# Patient Record
Sex: Female | Born: 1935 | Race: White | Hispanic: No | Smoking: Former smoker
Health system: Southern US, Community
[De-identification: ages and names within clinical notes are randomized; demographics above are authoritative.]

## PROBLEM LIST (undated history)

## (undated) DIAGNOSIS — G7000 Myasthenia gravis without (acute) exacerbation: Secondary | ICD-10-CM

## (undated) DIAGNOSIS — M1990 Unspecified osteoarthritis, unspecified site: Secondary | ICD-10-CM

## (undated) DIAGNOSIS — F0390 Unspecified dementia without behavioral disturbance: Secondary | ICD-10-CM

## (undated) DIAGNOSIS — K759 Inflammatory liver disease, unspecified: Secondary | ICD-10-CM

## (undated) DIAGNOSIS — R519 Headache, unspecified: Secondary | ICD-10-CM

## (undated) DIAGNOSIS — R251 Tremor, unspecified: Secondary | ICD-10-CM

## (undated) DIAGNOSIS — E785 Hyperlipidemia, unspecified: Secondary | ICD-10-CM

## (undated) DIAGNOSIS — R51 Headache: Secondary | ICD-10-CM

## (undated) HISTORY — PX: COLONOSCOPY: SHX174

---
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
## 2004-06-28 ENCOUNTER — Ambulatory Visit: Payer: Self-pay | Admitting: Internal Medicine

## 2005-07-04 ENCOUNTER — Ambulatory Visit: Payer: Self-pay | Admitting: Internal Medicine

## 2006-05-01 ENCOUNTER — Emergency Department: Payer: Self-pay | Admitting: Emergency Medicine

## 2006-07-26 ENCOUNTER — Ambulatory Visit: Payer: Self-pay | Admitting: Internal Medicine

## 2007-05-29 ENCOUNTER — Ambulatory Visit: Payer: Self-pay | Admitting: Internal Medicine

## 2007-06-13 ENCOUNTER — Ambulatory Visit: Payer: Self-pay | Admitting: Obstetrics and Gynecology

## 2007-07-29 ENCOUNTER — Ambulatory Visit: Payer: Self-pay | Admitting: Internal Medicine

## 2007-09-17 ENCOUNTER — Ambulatory Visit: Payer: Self-pay | Admitting: Obstetrics and Gynecology

## 2008-03-25 ENCOUNTER — Ambulatory Visit: Payer: Self-pay | Admitting: Obstetrics and Gynecology

## 2008-04-28 ENCOUNTER — Ambulatory Visit: Payer: Self-pay | Admitting: Unknown Physician Specialty

## 2008-08-03 ENCOUNTER — Ambulatory Visit: Payer: Self-pay | Admitting: Internal Medicine

## 2009-08-04 ENCOUNTER — Ambulatory Visit: Payer: Self-pay | Admitting: Internal Medicine

## 2010-08-08 ENCOUNTER — Ambulatory Visit: Payer: Self-pay | Admitting: Internal Medicine

## 2010-10-11 ENCOUNTER — Ambulatory Visit: Payer: Self-pay | Admitting: Ophthalmology

## 2010-10-24 ENCOUNTER — Ambulatory Visit: Payer: Self-pay | Admitting: Ophthalmology

## 2011-08-23 ENCOUNTER — Ambulatory Visit: Payer: Self-pay | Admitting: Internal Medicine

## 2012-01-10 ENCOUNTER — Ambulatory Visit: Payer: Self-pay | Admitting: Ophthalmology

## 2012-01-22 ENCOUNTER — Ambulatory Visit: Payer: Self-pay | Admitting: Ophthalmology

## 2012-12-10 ENCOUNTER — Ambulatory Visit: Payer: Self-pay | Admitting: Internal Medicine

## 2012-12-11 ENCOUNTER — Ambulatory Visit: Payer: Self-pay | Admitting: Internal Medicine

## 2013-06-09 ENCOUNTER — Ambulatory Visit: Payer: Self-pay | Admitting: Ophthalmology

## 2013-06-09 LAB — SEDIMENTATION RATE: Erythrocyte Sed Rate: 9 mm/hr (ref 0–30)

## 2013-06-17 ENCOUNTER — Ambulatory Visit: Payer: Self-pay | Admitting: Internal Medicine

## 2013-12-04 DIAGNOSIS — G7000 Myasthenia gravis without (acute) exacerbation: Secondary | ICD-10-CM | POA: Insufficient documentation

## 2013-12-11 ENCOUNTER — Ambulatory Visit: Payer: Self-pay | Admitting: Internal Medicine

## 2014-07-21 DIAGNOSIS — E538 Deficiency of other specified B group vitamins: Secondary | ICD-10-CM | POA: Insufficient documentation

## 2014-09-01 NOTE — Op Note (Signed)
PATIENT NAME:  Lauren Roman, Lauren Roman MR#:  [id number] DATE OF BIRTH:  [birth year]  DATE OF PROCEDURE:  01/22/2012  PREOPERATIVE DIAGNOSIS:  Cataract, left eye.    POSTOPERATIVE DIAGNOSIS:  Cataract, left eye.  PROCEDURE PERFORMED:  Extracapsular cataract extraction using phacoemulsification with placement of an Alcon [id number], 23.5-diopter posterior chamber lens, serial # [id number].  SURGEON:  Maylon PeppersSteven A. Maurice Fotheringham, MD  ASSISTANT:  None.  ANESTHESIA:  4% lidocaine and 0.75% Marcaine in a 50/50 mixture with 10 units/mL of Hylenex added, given as a peribulbar.  ANESTHESIOLOGIST:  Dr. Dimple Caseyice.   COMPLICATIONS:  None.  ESTIMATED BLOOD LOSS:  Less than 1 mL.  DESCRIPTION OF PROCEDURE:  The patient was brought to the operating room and given a peribulbar block.  The patient was then prepped and draped in the usual fashion.  The vertical rectus muscles were imbricated using 5-0 silk sutures.  These sutures were then clamped to the sterile drapes as bridle sutures.  A limbal peritomy was performed extending two clock hours and hemostasis was obtained with cautery.  A partial thickness scleral groove was made at the surgical limbus and dissected anteriorly in a lamellar dissection using an Alcon crescent knife.  The anterior chamber was entered supero-temporally with a Superblade and through the lamellar dissection with a 2.6 mm keratome.  DisCoVisc was used to replace the aqueous and a continuous tear capsulorrhexis was carried out.  Hydrodissection and hydrodelineation were carried out with balanced salt and a 27 gauge canula.  The nucleus was rotated to confirm the effectiveness of the hydrodissection.  Phacoemulsification was carried out using a divide-and-conquer technique.  Total ultrasound time was 1 minute and 58.5 seconds with an average power of 20.4 percent. CDE 48.10.  Irrigation/aspiration was used to remove the residual cortex.  DisCoVisc was used to inflate the capsule and the internal  incision was enlarged to 3 mm with the crescent knife.  The intraocular lens was folded and inserted into the capsular bag using the AcrySert delivery system.  Irrigation/aspiration was used to remove the residual DisCoVisc.  Miostat was injected into the anterior chamber through the paracentesis track to inflate the anterior chamber and induce miosis.  The wound was checked for leaks and none were found. The conjunctiva was closed with cautery and the bridle sutures were removed.  Two drops of 0.3% Vigamox were placed on the eye.   An eye shield was placed on the eye.  The patient was discharged to the recovery room in good condition.  ____________________________ Maylon PeppersSteven A. Natasha Paulson, MD sad:cms D: 01/22/2012 13:41:09 ET T: 01/22/2012 14:03:51 ET JOB#: [id number]  cc: Viviann SpareSteven A. Kacy Hegna, MD, <Dictator>  Erline LevineSTEVEN A Danny Yackley MD ELECTRONICALLY SIGNED 02/12/2012 12:33

## 2015-12-19 ENCOUNTER — Emergency Department (HOSPITAL_COMMUNITY)
Admission: EM | Admit: 2015-12-19 | Discharge: 2015-12-19 | Discharge status: Absent without leave | Payer: Medicare Other

## 2016-06-15 DIAGNOSIS — E782 Mixed hyperlipidemia: Secondary | ICD-10-CM | POA: Insufficient documentation

## 2016-06-15 DIAGNOSIS — M1990 Unspecified osteoarthritis, unspecified site: Secondary | ICD-10-CM | POA: Insufficient documentation

## 2016-06-15 DIAGNOSIS — M818 Other osteoporosis without current pathological fracture: Secondary | ICD-10-CM | POA: Insufficient documentation

## 2017-12-31 ENCOUNTER — Other Ambulatory Visit: Payer: Self-pay | Admitting: Surgery

## 2017-12-31 ENCOUNTER — Ambulatory Visit
Admission: RE | Admit: 2017-12-31 | Discharge: 2017-12-31 | Disposition: A | Discharge status: Home / Self Care | Payer: Medicare Other | Source: Ambulatory Visit | Attending: Surgery | Admitting: Surgery

## 2017-12-31 DIAGNOSIS — M7062 Trochanteric bursitis, left hip: Secondary | ICD-10-CM | POA: Diagnosis present

## 2017-12-31 DIAGNOSIS — M25562 Pain in left knee: Secondary | ICD-10-CM | POA: Diagnosis present

## 2018-01-07 ENCOUNTER — Other Ambulatory Visit: Payer: Self-pay | Admitting: Student

## 2018-01-07 DIAGNOSIS — M25552 Pain in left hip: Secondary | ICD-10-CM

## 2018-01-07 DIAGNOSIS — M7062 Trochanteric bursitis, left hip: Secondary | ICD-10-CM

## 2018-01-08 ENCOUNTER — Other Ambulatory Visit: Payer: Self-pay | Admitting: Student

## 2018-01-08 DIAGNOSIS — M4807 Spinal stenosis, lumbosacral region: Secondary | ICD-10-CM

## 2018-01-11 ENCOUNTER — Ambulatory Visit
Admission: RE | Admit: 2018-01-11 | Discharge: 2018-01-11 | Disposition: A | Discharge status: Home / Self Care | Payer: Medicare Other | Source: Ambulatory Visit | Attending: Student | Admitting: Student

## 2018-01-11 ENCOUNTER — Ambulatory Visit: Payer: Medicare Other

## 2018-01-11 DIAGNOSIS — M4807 Spinal stenosis, lumbosacral region: Secondary | ICD-10-CM

## 2018-01-11 DIAGNOSIS — M5136 Other intervertebral disc degeneration, lumbar region: Secondary | ICD-10-CM | POA: Insufficient documentation

## 2018-01-11 DIAGNOSIS — M5126 Other intervertebral disc displacement, lumbar region: Secondary | ICD-10-CM | POA: Diagnosis not present

## 2018-01-11 DIAGNOSIS — M7062 Trochanteric bursitis, left hip: Secondary | ICD-10-CM | POA: Diagnosis present

## 2018-01-11 DIAGNOSIS — S73192A Other sprain of left hip, initial encounter: Secondary | ICD-10-CM | POA: Diagnosis not present

## 2018-01-11 DIAGNOSIS — M4316 Spondylolisthesis, lumbar region: Secondary | ICD-10-CM | POA: Diagnosis not present

## 2018-01-11 DIAGNOSIS — M25552 Pain in left hip: Secondary | ICD-10-CM

## 2018-01-20 ENCOUNTER — Ambulatory Visit: Payer: Medicare Other

## 2018-01-28 ENCOUNTER — Other Ambulatory Visit: Payer: Medicare Other

## 2018-01-29 ENCOUNTER — Other Ambulatory Visit: Payer: Self-pay

## 2018-01-29 ENCOUNTER — Encounter: Payer: Self-pay | Admitting: *Deleted

## 2018-01-29 ENCOUNTER — Encounter
Admission: RE | Admit: 2018-01-29 | Discharge: 2018-01-29 | Disposition: A | Discharge status: Home / Self Care | Payer: Medicare Other | Source: Ambulatory Visit | Attending: Neurosurgery | Admitting: Neurosurgery

## 2018-01-29 DIAGNOSIS — M6281 Muscle weakness (generalized): Secondary | ICD-10-CM | POA: Diagnosis not present

## 2018-01-29 DIAGNOSIS — M5116 Intervertebral disc disorders with radiculopathy, lumbar region: Secondary | ICD-10-CM | POA: Diagnosis present

## 2018-01-29 DIAGNOSIS — G7000 Myasthenia gravis without (acute) exacerbation: Secondary | ICD-10-CM | POA: Diagnosis not present

## 2018-01-29 DIAGNOSIS — Z87891 Personal history of nicotine dependence: Secondary | ICD-10-CM | POA: Diagnosis not present

## 2018-01-29 DIAGNOSIS — Z79899 Other long term (current) drug therapy: Secondary | ICD-10-CM | POA: Diagnosis not present

## 2018-01-29 HISTORY — DX: Unspecified dementia, unspecified severity, without behavioral disturbance, psychotic disturbance, mood disturbance, and anxiety: F03.90

## 2018-01-29 HISTORY — DX: Unspecified osteoarthritis, unspecified site: M19.90

## 2018-01-29 HISTORY — DX: Hyperlipidemia, unspecified: E78.5

## 2018-01-29 HISTORY — DX: Myasthenia gravis without (acute) exacerbation: G70.00

## 2018-01-29 HISTORY — DX: Headache, unspecified: R51.9

## 2018-01-29 HISTORY — DX: Headache: R51

## 2018-01-29 HISTORY — DX: Tremor, unspecified: R25.1

## 2018-01-29 HISTORY — DX: Inflammatory liver disease, unspecified: K75.9

## 2018-01-29 LAB — PROTIME-INR
INR: 1
Prothrombin Time: 13.1 seconds (ref 11.4–15.2)

## 2018-01-29 LAB — APTT: aPTT: 31 seconds (ref 24–36)

## 2018-01-29 LAB — TYPE AND SCREEN
ABO/RH(D): O POS
Antibody Screen: NEGATIVE

## 2018-01-29 LAB — SURGICAL PCR SCREEN
MRSA, PCR: NEGATIVE
Staphylococcus aureus: NEGATIVE

## 2018-01-29 NOTE — Patient Instructions (Addendum)
[facility]: [facility] 2nd floor medical mall [facility](Medical Mall Entrance-take elevator on left to 2nd floor.  Check in with surgery information desk.) To find out your arrival time please call [phone] between 1PM - 3PM on 01-29-18 TUESDAY  Remember: Instructions that are not followed completely may result in serious medical risk, up to and including death, or upon the discretion of your surgeon and anesthesiologist your surgery may need to be rescheduled.    _x___ 1. Do not eat food after midnight the night before your procedure. NO GUM OR CANDY AFTER MIDNIGHT.  You may drink clear liquids up to 2 hours before you are scheduled to arrive at the hospital for your procedure.  Do not drink clear liquids within 2 hours of your scheduled arrival to the hospital.  Clear liquids include  --Water or Apple juice without pulp  --Clear carbohydrate beverage such as ClearFast or Gatorade  --Black Coffee or Clear Tea (No milk, no creamers, do not add anything to the coffee or Tea   ____Ensure clear carbohydrate drink on the way to the hospital for bariatric patients  ____Ensure clear carbohydrate drink 3 hours before surgery for Dr Rutherford NailByrnett's patients if physician instructed.     __x__ 2. No Alcohol for 24 hours before or after surgery.   __x__3. No Smoking or e-cigarettes for 24 prior to surgery.  Do not use any chewable tobacco products for at least 6 hour prior to surgery   ____  4. Bring all medications with you on the day of surgery if instructed.    __x__ 5. Notify your doctor if there is any change in your medical condition     (cold, fever, infections).    x___6. On the morning of surgery brush your teeth with toothpaste and water.  You may rinse your mouth with mouth wash if you wish.  Do not swallow any toothpaste or mouthwash.   Do not wear jewelry, make-up, hairpins, clips or nail polish.  Do not wear lotions, powders, or perfumes.  You may wear deodorant.  Do not shave 48 hours prior to surgery. Men may shave face and neck.  Do not bring valuables to the hospital.    [facility] for any belongings or valuables.               Contacts, dentures or bridgework may not be worn into surgery.  Leave your suitcase in the car. After surgery it may be brought to your room.  For patients admitted to the hospital, discharge time is determined by your treatment team.  _  Patients discharged the day of surgery will not be allowed to drive home.  You will need someone to drive you home and stay with you the night of your procedure.    Please read over the following fact sheets that you were given:   [facility] Preparing [facility] and or MRSA Information   _x___ TAKE THE FOLLOWING MEDICATION THE MORNING OF SURGERY WITH A SMALL SIP OF WATER. These include:  1. GABAPENTIN (NEURONTIN)  2. YOU MAY TAKE HYDROCODONE DAY OF SURGERY IF NEEDED  3.  4.  5.  6.  ____Fleets enema or Magnesium Citrate as directed.   _x___ Use CHG Soap or sage wipes as directed on instruction sheet   ____ Use inhalers on the day of surgery and bring to hospital day of surgery  ____ Stop Metformin and Janumet 2 days prior to  surgery.    ____ Take 1/2 of usual insulin dose the night before surgery and none on the morning surgery.   ____ Follow recommendations from Cardiologist, Pulmonologist or PCP regarding stopping Aspirin, Coumadin, Plavix ,Eliquis, Effient, or Pradaxa, and Pletal.  X____Stop Anti-inflammatories such as Advil, Aleve, Ibuprofen, Motrin, Naproxen, Naprosyn, Goodies powders or aspirin products NOW- OK to take Tylenol OR HYDROCODONE IF NEEDED   ____ Stop supplements until after surgery.     ____ Bring C-Pap to the hospital.

## 2018-01-30 ENCOUNTER — Ambulatory Visit: Payer: Medicare Other | Admitting: Certified Registered"

## 2018-01-30 ENCOUNTER — Ambulatory Visit: Payer: Medicare Other

## 2018-01-30 ENCOUNTER — Encounter: Payer: Self-pay | Admitting: *Deleted

## 2018-01-30 ENCOUNTER — Other Ambulatory Visit: Payer: Self-pay

## 2018-01-30 ENCOUNTER — Encounter
Admission: RE | Disposition: A | Discharge status: Home / Self Care | Payer: Self-pay | Source: Ambulatory Visit | Attending: Neurosurgery

## 2018-01-30 ENCOUNTER — Observation Stay
Admission: RE | Admit: 2018-01-30 | Discharge: 2018-01-31 | Disposition: A | Discharge status: Home / Self Care | Payer: Medicare Other | Source: Ambulatory Visit | Attending: Neurosurgery | Admitting: Neurosurgery

## 2018-01-30 DIAGNOSIS — G7000 Myasthenia gravis without (acute) exacerbation: Secondary | ICD-10-CM | POA: Insufficient documentation

## 2018-01-30 DIAGNOSIS — M5116 Intervertebral disc disorders with radiculopathy, lumbar region: Secondary | ICD-10-CM | POA: Diagnosis not present

## 2018-01-30 DIAGNOSIS — Z79899 Other long term (current) drug therapy: Secondary | ICD-10-CM | POA: Insufficient documentation

## 2018-01-30 DIAGNOSIS — M6281 Muscle weakness (generalized): Secondary | ICD-10-CM | POA: Insufficient documentation

## 2018-01-30 DIAGNOSIS — M5416 Radiculopathy, lumbar region: Secondary | ICD-10-CM | POA: Diagnosis present

## 2018-01-30 DIAGNOSIS — Z87891 Personal history of nicotine dependence: Secondary | ICD-10-CM | POA: Insufficient documentation

## 2018-01-30 DIAGNOSIS — Z419 Encounter for procedure for purposes other than remedying health state, unspecified: Secondary | ICD-10-CM

## 2018-01-30 HISTORY — PX: LUMBAR LAMINECTOMY/DECOMPRESSION MICRODISCECTOMY: SHX5026

## 2018-01-30 LAB — ABO/RH: ABO/RH(D): O POS

## 2018-01-30 SURGERY — LUMBAR LAMINECTOMY/DECOMPRESSION MICRODISCECTOMY 1 LEVEL
Anesthesia: General | Site: Back

## 2018-01-30 MED ORDER — METHYLPREDNISOLONE ACETATE 40 MG/ML IJ SUSP
INTRAMUSCULAR | Status: DC | PRN
  Administered 2018-01-30: 40 mg

## 2018-01-30 MED ORDER — DEXAMETHASONE SODIUM PHOSPHATE 10 MG/ML IJ SOLN
INTRAMUSCULAR | Status: AC
  Filled 2018-01-30: qty 1

## 2018-01-30 MED ORDER — DEXAMETHASONE SODIUM PHOSPHATE 4 MG/ML IJ SOLN
4.0000 mg | Freq: Three times a day (TID) | INTRAMUSCULAR | Status: DC
  Administered 2018-01-30 – 2018-01-31 (×3): 4 mg via INTRAVENOUS
  Filled 2018-01-30 (×4): qty 1

## 2018-01-30 MED ORDER — SODIUM CHLORIDE 0.9% FLUSH
3.0000 mL | Freq: Two times a day (BID) | INTRAVENOUS | Status: DC
  Administered 2018-01-30 – 2018-01-31 (×2): 3 mL via INTRAVENOUS

## 2018-01-30 MED ORDER — PROPOFOL 10 MG/ML IV BOLUS
INTRAVENOUS | Status: DC | PRN
  Administered 2018-01-30: 50 mg via INTRAVENOUS
  Administered 2018-01-30 (×3): 30 mg via INTRAVENOUS

## 2018-01-30 MED ORDER — FENTANYL CITRATE (PF) 100 MCG/2ML IJ SOLN
INTRAMUSCULAR | Status: AC
  Filled 2018-01-30: qty 2

## 2018-01-30 MED ORDER — SUCCINYLCHOLINE CHLORIDE 20 MG/ML IJ SOLN
INTRAMUSCULAR | Status: AC
  Filled 2018-01-30: qty 1

## 2018-01-30 MED ORDER — ACETAMINOPHEN 500 MG PO TABS
1000.0000 mg | ORAL_TABLET | Freq: Four times a day (QID) | ORAL | Status: AC
  Administered 2018-01-30 – 2018-01-31 (×3): 1000 mg via ORAL
  Filled 2018-01-30 (×4): qty 2

## 2018-01-30 MED ORDER — SODIUM CHLORIDE 0.9 % IV SOLN
250.0000 mL | INTRAVENOUS | Status: DC
  Administered 2018-01-30: 250 mL via INTRAVENOUS

## 2018-01-30 MED ORDER — TRAMADOL HCL 50 MG PO TABS
50.0000 mg | ORAL_TABLET | ORAL | Status: DC | PRN
  Administered 2018-01-31: 50 mg via ORAL
  Filled 2018-01-30: qty 1

## 2018-01-30 MED ORDER — SODIUM CHLORIDE 0.9 % IR SOLN
Status: DC | PRN
  Administered 2018-01-30: 1000 mL

## 2018-01-30 MED ORDER — PROMETHAZINE HCL 25 MG/ML IJ SOLN: 6.2500 mg | INTRAMUSCULAR | Status: DC | PRN

## 2018-01-30 MED ORDER — ONDANSETRON HCL 4 MG/2ML IJ SOLN: 4.0000 mg | Freq: Four times a day (QID) | INTRAMUSCULAR | Status: DC | PRN

## 2018-01-30 MED ORDER — SENNOSIDES-DOCUSATE SODIUM 8.6-50 MG PO TABS: 1.0000 | ORAL_TABLET | Freq: Every evening | ORAL | Status: DC | PRN

## 2018-01-30 MED ORDER — SODIUM CHLORIDE 0.9% FLUSH: 3.0000 mL | INTRAVENOUS | Status: DC | PRN

## 2018-01-30 MED ORDER — ACETAMINOPHEN 325 MG PO TABS: 650.0000 mg | ORAL_TABLET | ORAL | Status: DC | PRN

## 2018-01-30 MED ORDER — FAMOTIDINE 20 MG PO TABS
20.0000 mg | ORAL_TABLET | Freq: Once | ORAL | Status: AC
  Administered 2018-01-30: 20 mg via ORAL

## 2018-01-30 MED ORDER — PROPOFOL 10 MG/ML IV BOLUS
INTRAVENOUS | Status: AC
  Filled 2018-01-30: qty 20

## 2018-01-30 MED ORDER — LACTATED RINGERS IV SOLN
INTRAVENOUS | Status: DC
  Administered 2018-01-30 (×2): via INTRAVENOUS

## 2018-01-30 MED ORDER — PHENOL 1.4 % MT LIQD
1.0000 | OROMUCOSAL | Status: DC | PRN
  Filled 2018-01-30: qty 177

## 2018-01-30 MED ORDER — ACETAMINOPHEN 650 MG RE SUPP: 650.0000 mg | RECTAL | Status: DC | PRN

## 2018-01-30 MED ORDER — MENTHOL 3 MG MT LOZG
1.0000 | LOZENGE | OROMUCOSAL | Status: DC | PRN
  Filled 2018-01-30: qty 9

## 2018-01-30 MED ORDER — SUCCINYLCHOLINE CHLORIDE 20 MG/ML IJ SOLN
INTRAMUSCULAR | Status: DC | PRN
  Administered 2018-01-30: 80 mg via INTRAVENOUS

## 2018-01-30 MED ORDER — CEFAZOLIN SODIUM-DEXTROSE 1-4 GM/50ML-% IV SOLN
1.0000 g | Freq: Once | INTRAVENOUS | Status: AC
  Administered 2018-01-30: 1 g via INTRAVENOUS

## 2018-01-30 MED ORDER — DEXAMETHASONE SODIUM PHOSPHATE 10 MG/ML IJ SOLN
INTRAMUSCULAR | Status: DC | PRN
  Administered 2018-01-30: 5 mg via INTRAVENOUS

## 2018-01-30 MED ORDER — SEVOFLURANE IN SOLN
RESPIRATORY_TRACT | Status: AC
  Filled 2018-01-30: qty 250

## 2018-01-30 MED ORDER — ROCURONIUM BROMIDE 50 MG/5ML IV SOLN
INTRAVENOUS | Status: AC
  Filled 2018-01-30: qty 1

## 2018-01-30 MED ORDER — GABAPENTIN 300 MG PO CAPS
300.0000 mg | ORAL_CAPSULE | Freq: Three times a day (TID) | ORAL | Status: DC
  Administered 2018-01-30 – 2018-01-31 (×3): 300 mg via ORAL
  Filled 2018-01-30 (×3): qty 1

## 2018-01-30 MED ORDER — METHOCARBAMOL 1000 MG/10ML IJ SOLN
500.0000 mg | Freq: Four times a day (QID) | INTRAVENOUS | Status: DC | PRN
  Filled 2018-01-30: qty 5

## 2018-01-30 MED ORDER — ROCURONIUM BROMIDE 100 MG/10ML IV SOLN
INTRAVENOUS | Status: DC | PRN
  Administered 2018-01-30: 5 mg via INTRAVENOUS

## 2018-01-30 MED ORDER — SODIUM CHLORIDE 0.9 % IV SOLN
INTRAVENOUS | Status: DC
  Administered 2018-01-30: 21:00:00 via INTRAVENOUS

## 2018-01-30 MED ORDER — ONDANSETRON HCL 4 MG PO TABS: 4.0000 mg | ORAL_TABLET | Freq: Four times a day (QID) | ORAL | Status: DC | PRN

## 2018-01-30 MED ORDER — METHOCARBAMOL 500 MG PO TABS: 500.0000 mg | ORAL_TABLET | Freq: Four times a day (QID) | ORAL | Status: DC | PRN

## 2018-01-30 MED ORDER — LIDOCAINE HCL (PF) 2 % IJ SOLN
INTRAMUSCULAR | Status: AC
  Filled 2018-01-30: qty 10

## 2018-01-30 MED ORDER — SODIUM CHLORIDE 0.9 % IV SOLN
INTRAVENOUS | Status: DC | PRN
  Administered 2018-01-30: 40 mL

## 2018-01-30 MED ORDER — FENTANYL CITRATE (PF) 100 MCG/2ML IJ SOLN
INTRAMUSCULAR | Status: DC | PRN
  Administered 2018-01-30 (×2): 50 ug via INTRAVENOUS

## 2018-01-30 MED ORDER — ONDANSETRON HCL 4 MG/2ML IJ SOLN
INTRAMUSCULAR | Status: AC
  Filled 2018-01-30: qty 2

## 2018-01-30 MED ORDER — BUPIVACAINE-EPINEPHRINE (PF) 0.5% -1:200000 IJ SOLN
INTRAMUSCULAR | Status: DC | PRN
  Administered 2018-01-30: 8 mL

## 2018-01-30 MED ORDER — LIDOCAINE HCL (CARDIAC) PF 100 MG/5ML IV SOSY
PREFILLED_SYRINGE | INTRAVENOUS | Status: DC | PRN
  Administered 2018-01-30: 60 mg via INTRAVENOUS

## 2018-01-30 MED ORDER — FENTANYL CITRATE (PF) 100 MCG/2ML IJ SOLN
25.0000 ug | INTRAMUSCULAR | Status: DC | PRN
  Administered 2018-01-30 (×2): 25 ug via INTRAVENOUS

## 2018-01-30 MED ORDER — PROPOFOL 500 MG/50ML IV EMUL
INTRAVENOUS | Status: DC | PRN
  Administered 2018-01-30: 25 ug/kg/min via INTRAVENOUS

## 2018-01-30 MED ORDER — CEFAZOLIN SODIUM-DEXTROSE 1-4 GM/50ML-% IV SOLN
INTRAVENOUS | Status: AC
  Filled 2018-01-30: qty 50

## 2018-01-30 MED ORDER — ACETAMINOPHEN 10 MG/ML IV SOLN
INTRAVENOUS | Status: DC | PRN
  Administered 2018-01-30: 1000 mg via INTRAVENOUS

## 2018-01-30 MED ORDER — KETOROLAC TROMETHAMINE 30 MG/ML IJ SOLN
INTRAMUSCULAR | Status: DC | PRN
  Administered 2018-01-30: 15 mg via INTRAVENOUS

## 2018-01-30 MED ORDER — ONDANSETRON HCL 4 MG/2ML IJ SOLN
INTRAMUSCULAR | Status: DC | PRN
  Administered 2018-01-30: 4 mg via INTRAVENOUS

## 2018-01-30 MED ORDER — FAMOTIDINE 20 MG PO TABS
ORAL_TABLET | ORAL | Status: AC
  Administered 2018-01-30: 20 mg via ORAL
  Filled 2018-01-30: qty 1

## 2018-01-30 MED ORDER — BUPIVACAINE HCL 0.5 % IJ SOLN
INTRAMUSCULAR | Status: DC | PRN
  Administered 2018-01-30: 20 mL

## 2018-01-30 MED ORDER — FENTANYL CITRATE (PF) 100 MCG/2ML IJ SOLN
INTRAMUSCULAR | Status: AC
  Administered 2018-01-30: 25 ug via INTRAVENOUS
  Filled 2018-01-30: qty 2

## 2018-01-30 MED ORDER — ACETAMINOPHEN 10 MG/ML IV SOLN
INTRAVENOUS | Status: AC
  Filled 2018-01-30: qty 100

## 2018-01-30 MED ORDER — KETOROLAC TROMETHAMINE 30 MG/ML IJ SOLN
INTRAMUSCULAR | Status: AC
  Filled 2018-01-30: qty 1

## 2018-01-30 MED ORDER — THROMBIN 5000 UNITS EX SOLR
CUTANEOUS | Status: DC | PRN
  Administered 2018-01-30: 5000 [IU] via TOPICAL

## 2018-01-30 SURGICAL SUPPLY — 56 items
BUR NEURO DRILL SOFT 3.0X3.8M (BURR) ×2 IMPLANT
CANISTER SUCT 1200ML W/VALVE (MISCELLANEOUS) ×4 IMPLANT
CHLORAPREP W/TINT 26ML (MISCELLANEOUS) ×4 IMPLANT
CNTNR SPEC 2.5X3XGRAD LEK (MISCELLANEOUS) ×1
CONT SPEC 4OZ STER OR WHT (MISCELLANEOUS) ×1
CONTAINER SPEC 2.5X3XGRAD LEK (MISCELLANEOUS) ×1 IMPLANT
COUNTER NEEDLE 20/40 LG (NEEDLE) ×2 IMPLANT
COVER LIGHT HANDLE STERIS (MISCELLANEOUS) ×4 IMPLANT
CUP MEDICINE 2OZ PLAST GRAD ST (MISCELLANEOUS) ×4 IMPLANT
DERMABOND ADVANCED (GAUZE/BANDAGES/DRESSINGS) ×1
DERMABOND ADVANCED .7 DNX12 (GAUZE/BANDAGES/DRESSINGS) ×1 IMPLANT
DRAPE C-ARM 42X72 X-RAY (DRAPES) ×4 IMPLANT
DRAPE LAPAROTOMY 100X77 ABD (DRAPES) ×2 IMPLANT
DRAPE MICROSCOPE SPINE 48X150 (DRAPES) ×2 IMPLANT
DRAPE POUCH INSTRU U-SHP 10X18 (DRAPES) ×2 IMPLANT
DRAPE SURG 17X11 SM STRL (DRAPES) ×8 IMPLANT
ELECT CAUTERY BLADE TIP 2.5 (TIP) ×2
ELECT EZSTD 165MM 6.5IN (MISCELLANEOUS)
ELECT REM PT RETURN 9FT ADLT (ELECTROSURGICAL) ×2
ELECTRODE CAUTERY BLDE TIP 2.5 (TIP) ×1 IMPLANT
ELECTRODE EZSTD 165MM 6.5IN (MISCELLANEOUS) IMPLANT
ELECTRODE REM PT RTRN 9FT ADLT (ELECTROSURGICAL) ×1 IMPLANT
FRAME EYE SHIELD (PROTECTIVE WEAR) ×4 IMPLANT
GLOVE BIO SURGEON STRL SZ 6.5 (GLOVE) ×4 IMPLANT
GLOVE BIOGEL PI IND STRL 7.0 (GLOVE) ×1 IMPLANT
GLOVE BIOGEL PI INDICATOR 7.0 (GLOVE) ×1
GLOVE SURG SYN 8.5  E (GLOVE) ×3
GLOVE SURG SYN 8.5 E (GLOVE) ×3 IMPLANT
GOWN SRG XL LVL 3 NONREINFORCE (GOWNS) ×1 IMPLANT
GOWN STRL NON-REIN TWL XL LVL3 (GOWNS) ×1
GOWN STRL REUS W/TWL MED LVL3 (GOWN DISPOSABLE) ×2 IMPLANT
GRADUATE 1200CC STRL 31836 (MISCELLANEOUS) ×2 IMPLANT
IV CATH ANGIO 12GX3 LT BLUE (NEEDLE) ×2 IMPLANT
KIT SPINAL PRONEVIEW (KITS) ×2 IMPLANT
KNIFE BAYONET SHORT DISCETOMY (MISCELLANEOUS) IMPLANT
MARKER SKIN DUAL TIP RULER LAB (MISCELLANEOUS) ×2 IMPLANT
NDL SAFETY ECLIPSE 18X1.5 (NEEDLE) ×1 IMPLANT
NEEDLE HYPO 18GX1.5 SHARP (NEEDLE) ×1
NEEDLE HYPO 22GX1.5 SAFETY (NEEDLE) ×2 IMPLANT
NS IRRIG 1000ML POUR BTL (IV SOLUTION) ×2 IMPLANT
PACK LAMINECTOMY NEURO (CUSTOM PROCEDURE TRAY) ×2 IMPLANT
PAD ARMBOARD 7.5X6 YLW CONV (MISCELLANEOUS) ×2 IMPLANT
SPOGE SURGIFLO 8M (HEMOSTASIS) ×1
SPONGE SURGIFLO 8M (HEMOSTASIS) ×1 IMPLANT
SUT DVC VLOC 3-0 CL 6 P-12 (SUTURE) ×2 IMPLANT
SUT VIC AB 0 CT1 27 (SUTURE) ×1
SUT VIC AB 0 CT1 27XCR 8 STRN (SUTURE) ×1 IMPLANT
SUT VIC AB 2-0 CT1 18 (SUTURE) ×2 IMPLANT
SYR 10ML LL (SYRINGE) ×2 IMPLANT
SYR 20CC LL (SYRINGE) ×2 IMPLANT
SYR 30ML LL (SYRINGE) ×4 IMPLANT
SYR 3ML LL SCALE MARK (SYRINGE) ×2 IMPLANT
TOWEL OR 17X26 4PK STRL BLUE (TOWEL DISPOSABLE) ×6 IMPLANT
TUBE MATRX SPINL 18MM 4CM DISP (INSTRUMENTS) ×1
TUBE METRX SPINAL 18X4 DISP (INSTRUMENTS) ×1 IMPLANT
TUBING CONNECTING 10 (TUBING) ×2 IMPLANT

## 2018-01-30 NOTE — Anesthesia Procedure Notes (Addendum)
Procedure Name: Intubation Date/Time: 01/30/2018 3:40 PM Performed by: Dionne Bucy, CRNA Pre-anesthesia Checklist: Patient identified, Patient being monitored, Timeout performed, Emergency Drugs available and Suction available Patient Re-evaluated:Patient Re-evaluated prior to induction Oxygen Delivery Method: Circle system utilized Preoxygenation: Pre-oxygenation with 100% oxygen Induction Type: IV induction Ventilation: Mask ventilation without difficulty Laryngoscope Size: Mac and 3 Grade View: Grade I Tube type: Oral Tube size: 6.5 mm Number of attempts: 1 Airway Equipment and Method: Stylet Placement Confirmation: ETT inserted through vocal cords under direct vision,  positive ETCO2 and breath sounds checked- equal and bilateral Secured at: 21 cm Tube secured with: Tape Dental Injury: Teeth and Oropharynx as per pre-operative assessment

## 2018-01-30 NOTE — Anesthesia Preprocedure Evaluation (Addendum)
Anesthesia Evaluation  Patient identified by MRN, date of birth, ID band Patient awake    Reviewed: Allergy & Precautions, H&P , NPO status , Patient's Chart, lab work & pertinent test results  Airway Mallampati: II  TM Distance: >3 FB Neck ROM: limited    Dental  (+) Missing, Chipped   Pulmonary neg pulmonary ROS, former smoker,    breath sounds clear to auscultation       Cardiovascular negative cardio ROS   Rhythm:regular Rate:Normal     Neuro/Psych  Headaches, PSYCHIATRIC DISORDERS Dementia  Neuromuscular disease (MYASTHENIA GRAVIS - pt and her daughter deny this diagnosis, she does not take any meds but has a documented allergy to Exelon... unclear diagnosis) negative neurological ROS  negative psych ROS   GI/Hepatic negative GI ROS, Neg liver ROS, (+) Hepatitis -, B  Endo/Other  negative endocrine ROS  Renal/GU      Musculoskeletal   Abdominal   Peds  Hematology negative hematology ROS (+)   Anesthesia Other Findings Past Medical History: No date: Arthritis No date: Dementia     Comment:  mild No date: Headache     Comment:  migraines 1950: Hepatitis     Comment:  hep b-no problems since then No date: Hyperlipidemia No date: Myasthenia gravis (HCC) No date: Tremor     Comment:  right hand  Past Surgical History: No date: COLONOSCOPY  BMI    Body Mass Index:  20.04 kg/m      Reproductive/Obstetrics negative OB ROS                            Anesthesia Physical Anesthesia Plan  ASA: III  Anesthesia Plan: General ETT   Post-op Pain Management:    Induction: Intravenous  PONV Risk Score and Plan: Ondansetron and Dexamethasone  Airway Management Planned: Oral ETT  Additional Equipment:   Intra-op Plan:   Post-operative Plan: Extubation in OR  Informed Consent: I have reviewed the patients History and Physical, chart, labs and discussed the procedure including  the risks, benefits and alternatives for the proposed anesthesia with the patient or authorized representative who has indicated his/her understanding and acceptance.   Dental Advisory Given  Plan Discussed with: Anesthesiologist, CRNA and Surgeon  Anesthesia Plan Comments: (Spoke to patient and daughter regarding possibility of prolonged postop intubation in setting of MG.)       Anesthesia Quick Evaluation

## 2018-01-30 NOTE — H&P (Signed)
Heart sounds normal no MRG. Chest Clear to Auscultation Bilaterally.  I have reviewed and confirmed my history and physical from 01/24/2018 with no additions or changes. Plan for L4-5 decompression and left L4-5 far lateral discectomy.  Risks and benefits reviewed.

## 2018-01-30 NOTE — Op Note (Signed)
Indications: Lauren Roman is an 84 yo female who presented with lumbar radiculopathy.  She failed conservative management and elected for surgical intervention.  Findings: disc herniation L4-5 impacting L L4 nerve root, central stenosis at L4-5  Preoperative Diagnosis: Lumbar radiculopathy Postoperative Diagnosis: same   EBL: 125 ml IVF: 1100 ml Drains: one placed Disposition: Extubated and Stable to PACU Complications: none  No foley catheter was placed.   Preoperative Note:   Risks of surgery discussed include: infection, bleeding, stroke, coma, death, paralysis, CSF leak, nerve/spinal cord injury, numbness, tingling, weakness, complex regional pain syndrome, recurrent stenosis and/or disc herniation, vascular injury, development of instability, neck/back pain, need for further surgery, persistent symptoms, development of deformity, and the risks of anesthesia. The patient understood these risks and agreed to proceed.  Operative Note:    The patient was then brought from the preoperative center with intravenous access established.  The patient underwent general anesthesia and endotracheal tube intubation, and was then rotated on the [place] rail top where all pressure points were appropriately padded.  The skin was then thoroughly cleansed.  Perioperative antibiotic prophylaxis was administered.  Sterile prep and drapes were then applied and a timeout was then observed.  C-arm was brought into the field under sterile conditions, and the L4-5 disc space identified and marked with an incision on the left 1cm lateral to midline. A second mark was placed over the left L5 pedicle 3 cm lateral to the midline.   Once this was complete a 2 cm incision was opened with the use of a #10 blade knife over the transverse process.  The Metrx tubes were sequentially advanced under lateral fluoroscopy until a 18 x 40 mm Metrx tube was placed over the left L5 transverse process and secured to the bed.     The microscope was then sterilely brought into the field and muscle creep was hemostased with a bipolar and resected with a pituitary rongeur.  A Bovie extender was then used to expose the left L5 transverse process and lateral L4-5 facet.  Careful attention was placed to not violate the facet capsule. A 3 mm matchstick drill bit was then used to remove the outer portion of the facet joint and top of the L5 transverse process.  The intertransverse membrane was identified and dissected free of the L5 transverse process.  Using careful dissection, the superior margin of the L5 pedicle was identified and dissected down to the junction with the vertebral body.  The disc was identified, and herniated material was noted.  The Penfield 4 and nerve dissector were used to identify the L4 nerve root.  The disc herniation was identified and dissected free using a balltip probe. The pituitary rongeur was used to remove the extruded disc fragments. Once the  nerve root were noted to be relaxed and under less tension the ball-tipped feeler was passed along the foramen proximally and distally to to ensure no residual compression was noted.    A Depo-Medrol soaked Gelfoam pledget was placed along the nerve root for 2 minutes and removed.  The area was irrigated. The tube system was then removed under microscopic visualization and hemostasis was obtained with a bipolar.    We then moved to the L4-5 central decompression. A 2 cm incision was opened 1 cm lateral to the midline with the use of a #10 blade knife.  The Metrx tubes were sequentially advanced under lateral fluoroscopy until a 18 x 40 mm Metrx tube was placed over the left L4/5  facet and lamina and secured to the bed.    The microscope was then used to visualize the lamina and spinous process.  A Bovie extender was then used to expose the spinous process and lamina.  Careful attention was placed to not violate the facet capsule. A 3 mm matchstick drill bit was  then used to make a hemi-laminotomy trough until the ligamentum flavum was exposed.  This was extended to the base of the spinous process.  Once this was complete and the underlying ligamentum flavum was visualized this was dissected with an up angle curette and resected with a #2 and #3 mm biting Kerrison.  The laminotomy opening was also expanded in similar fashion and hemostasis was obtained with Surgifoam and a patty as well as bone wax.  The rostral aspect of the caudal level of the lamina was also resected with a #2 biting Kerrison effort to further enhance exposure.  Once the underlying dura was visualized a Penfield 4 was then used to dissect and expose the traversing nerve root.    Using curettes and punches, the majority of the ligamentum flavum was removed. Once the thecal sac and nerve root were noted to be relaxed and under less tension the ball-tipped feeler was passed along the foramen distally to to ensure no residual compression was noted along the left L5 nerve root.    A Depo-Medrol soaked Gelfoam pledget was placed along the nerve root for 2 minutes and removed.  The area was irrigated. The tube system was then removed under microscopic visualization and hemostasis was obtained with a bipolar.    A drain was placed through both operative sites.   The fascial layer was reapproximated with the use of a 0- Vicryl suture.  Subcutaneous tissue layer was reapproximated using 2-0 Vicryl suture.  3-0 monocryl was used on the skin. The skin was then cleansed and Dermabond was used to close the skin opening.  Patient was then rotated back to the preoperative bed awakened from anesthesia and taken to recovery all counts are correct in this case.   I performed the entire procedure with the assistance of Ivar DrapeAmanda Ferri PA as an Designer, television/film setassistant surgeon.  Venetia Nighthester Silver Achey MD

## 2018-01-30 NOTE — Anesthesia Postprocedure Evaluation (Signed)
Anesthesia Post Note  Patient: Larene PickettDorothy G Motter  Procedure(s) Performed: LUMBAR LAMINECTOMY/DECOMPRESSION MICRODISCECTOMY 1 LEVEL-L4-5 FAR LATERAL DISCECTOMY (N/A Back)  Patient location during evaluation: PACU Anesthesia Type: General Level of consciousness: awake Pain management: pain level controlled Vital Signs Assessment: post-procedure vital signs reviewed and stable Respiratory status: spontaneous breathing, nonlabored ventilation, respiratory function stable and patient connected to nasal cannula oxygen Cardiovascular status: blood pressure returned to baseline and stable Postop Assessment: no apparent nausea or vomiting Anesthetic complications: no     Last Vitals:  Vitals:   01/30/18 1830 01/30/18 1845  BP: (!) 182/74 (!) 157/85  Pulse: 90 92  Resp: 14 15  Temp:    SpO2: 96% 97%    Last Pain:  Vitals:   01/30/18 1859  TempSrc:   PainSc: 7                  Cleda MccreedyJoseph K Jaelan Rasheed

## 2018-01-30 NOTE — Anesthesia Post-op Follow-up Note (Signed)
Anesthesia QCDR form completed.        

## 2018-01-30 NOTE — Transfer of Care (Signed)
Immediate Anesthesia Transfer of Care Note  Patient: Lauren PickettDorothy G Roman  Procedure(s) Performed: LUMBAR LAMINECTOMY/DECOMPRESSION MICRODISCECTOMY 1 LEVEL-L4-5 FAR LATERAL DISCECTOMY (N/A Back)  Patient Location: PACU  Anesthesia Type:General  Level of Consciousness: sedated  Airway & Oxygen Therapy: Patient Spontanous Breathing and Patient connected to face mask oxygen  Post-op Assessment: Report given to RN and Post -op Vital signs reviewed and stable  Post vital signs: Reviewed and stable  Last Vitals:  Vitals Value Taken Time  BP 182/74   Temp    Pulse 85 01/30/2018  6:01 PM  Resp 13 01/30/2018  6:01 PM  SpO2 100 % 01/30/2018  6:01 PM  Vitals shown include unvalidated device data.  Last Pain:  Vitals:   01/30/18 1355  TempSrc: Tympanic  PainSc: 4       Patients Stated Pain Goal: 0 (01/30/18 1355)  Complications: No apparent anesthesia complications

## 2018-01-30 NOTE — Progress Notes (Signed)
Procedure: L4-5 lumbar laminectomy and far lateral discectomy  Procedure Date: 01/30/2018 Diagnosis: Lumbar radiculopathy  History: Lauren PickettDorothy G Roman is here for L4-5 lumbar decompression with far lateral discectomy. Tolerated procedure well. Seen in post-op recovery still disoriented from anesthesia.  Complains of left leg pain but unable to specify.   Physical Exam: Vitals:   01/30/18 1355 01/30/18 1800  BP: (!) 179/71 (!) 182/74  Pulse: 87 86  Resp: 20 13  Temp: 97.8 F (36.6 C) 97.6 F (36.4 C)  SpO2: 100% 100%    AA Ox3 Skin: 2 incision sites: No bleeding, glue intact.  Drain site intact and dressed.  No bleeding. Strength:unable to assess. Patient able to move all extremities on command Sensation: Unable to fully assess due to sleepiness.   Data:  No results for input(s): NA, K, CL, CO2, BUN, CREATININE, LABGLOM, GLUCOSE, CALCIUM in the last 168 hours. No results for input(s): AST, ALT, ALKPHOS in the last 168 hours.  Invalid input(s): TBILI   No results for input(s): WBC, HGB, HCT, PLT in the last 168 hours. Recent Labs  Lab 01/29/18 1244  APTT 31  INR 1.00         Other tests/results: No imaging reviewed  Assessment/Plan:  Lauren Pickettorothy G Roman POD 0 status post L4-5 lumbar laminectomy and far lateral discectomy.  Will admit for continued observance and pain management.  Current plan is to manage pain through Tylenol, tramadol, dexamethasone, and Robaxin as needed.  - mobilize - pain control - DVT prophylaxis - PTOT  Ivar DrapeAmanda Jerardo Costabile PA-C Department of Neurosurgery

## 2018-01-31 ENCOUNTER — Encounter: Payer: Self-pay | Admitting: Neurosurgery

## 2018-01-31 DIAGNOSIS — M5116 Intervertebral disc disorders with radiculopathy, lumbar region: Secondary | ICD-10-CM | POA: Diagnosis not present

## 2018-01-31 LAB — GLUCOSE, CAPILLARY
Glucose-Capillary: 120 mg/dL — ABNORMAL HIGH (ref 70–99)
Glucose-Capillary: 164 mg/dL — ABNORMAL HIGH (ref 70–99)

## 2018-01-31 MED ORDER — TRAMADOL HCL 50 MG PO TABS: 50.0000 mg | ORAL_TABLET | ORAL | 0 refills | Status: DC | PRN

## 2018-01-31 MED ORDER — METHOCARBAMOL 500 MG PO TABS: 500.0000 mg | ORAL_TABLET | Freq: Four times a day (QID) | ORAL | 0 refills | Status: DC | PRN

## 2018-01-31 NOTE — Evaluation (Signed)
Physical Therapy Evaluation Patient Details Name: Lauren Roman MRN: [id number] DOB: 04/30/1929 Today's Date: 01/31/2018   History of Present Illness  Pt is an 84 yo female who presented with lumbar radiculopathy.  She failed conservative management and elected for surgical intervention.  Pt is now s/p L4-5 lumbar laminectomy and far lateral discectomy.  PMH includes: dementia, myasthenia gravis (pt/daughter deny this diagnosis), arthritis, right hand tremor, and Hep-B.      Clinical Impression  Pt presents with deficits in strength, transfers, mobility, gait, balance, and activity tolerance.  Pt required Min A and extensive cues for proper sequencing during bed mobility training using the log roll technique.  Pt required min A during sit to/from stand transfers again with extensive cues for proper sequencing.  Pt was very limited during ambulation fatiguing and requiring to return to sitting after walking 5 feet with a RW.  Pt's SpO2 and HR WNL during the session.  Pt will benefit from PT services in a SNF setting upon discharge to safely address above deficits for decreased caregiver assistance and eventual return to PLOF.      Follow Up Recommendations SNF;Supervision for mobility/OOB    Equipment Recommendations  None recommended by PT    Recommendations for Other Services       Precautions / Restrictions Precautions Precautions: Fall;Back Precaution Comments: No back brace required Restrictions Weight Bearing Restrictions: No Other Position/Activity Restrictions: No bending, arching, twisting      Mobility  Bed Mobility Overal bed mobility: Needs Assistance Bed Mobility: Sidelying to Sit;Sit to Sidelying   Sidelying to sit: Min assist     Sit to sidelying: Min assist General bed mobility comments: Mod verbal cues for log roll technique sequencing during bed mobility training  Transfers Overall transfer level: Needs assistance Equipment used: Rolling walker (2  wheeled) Transfers: Sit to/from Stand Sit to Stand: Min assist         General transfer comment: Mod verbal cues for proper sequencing  Ambulation/Gait Ambulation/Gait assistance: Min guard Gait Distance (Feet): 5 Feet Assistive device: Rolling walker (2 wheeled) Gait Pattern/deviations: Step-through pattern;Decreased step length - right;Decreased step length - left;Trunk flexed Gait velocity: Decreased   General Gait Details: Mod verbal cues for upright posture and amb closer to the [employer]arkW  Stairs            Wheelchair Mobility    Modified Rankin (Stroke Patients Only)       Balance Overall balance assessment: Mild deficits observed, not formally tested                                           Pertinent Vitals/Pain Pain Assessment: No/denies pain    Home Living Family/patient expects to be discharged to:: Private residence Living Arrangements: Other relatives(Brother) Available Help at Discharge: Family;Available 24 hours/day Type of Home: House Home Access: Stairs to enter Entrance Stairs-Rails: Right Entrance Stairs-Number of Steps: 2 Home Layout: One level Home Equipment: Walker - 2 wheels      Prior Function Level of Independence: Independent         Comments: Pt Ind with amb without an AD, no fall history, Ind with ADLs, active in the yard pulling weeds and mowing     Hand Dominance        Extremity/Trunk Assessment        Lower Extremity Assessment Lower Extremity Assessment: Generalized weakness  Communication   Communication: No difficulties  Cognition Arousal/Alertness: Awake/alert Behavior During Therapy: WFL for tasks assessed/performed Overall Cognitive Status: Within Functional Limits for tasks assessed                                        General Comments      Exercises Total Joint Exercises Ankle Circles/Pumps: AROM;Strengthening;Both;10 reps Quad Sets: Strengthening;Both;10  reps Gluteal Sets: Strengthening;Both;10 reps Heel Slides: AROM;Both;10 reps Hip ABduction/ADduction: AROM;Both;5 reps Straight Leg Raises: AROM;Both;5 reps Long Arc Quad: AROM;Both;10 reps Knee Flexion: AROM;Both;10 reps Marching in Standing: AROM;Both;5 reps;Standing Other Exercises Other Exercises: Back precaution education Other Exercises: Log roll technique education/training   Assessment/Plan    PT Assessment Patient needs continued PT services  PT Problem List Decreased strength;Decreased balance;Decreased knowledge of use of DME;Decreased knowledge of precautions;Decreased mobility;Decreased activity tolerance       PT Treatment Interventions DME instruction;Gait training;Stair training;Functional mobility training;Balance training;Therapeutic exercise;Therapeutic activities;Patient/family education    PT Goals (Current goals can be found in the Care Plan section)  Acute Rehab PT Goals Patient Stated Goal: To return home and work in the yard PT Goal Formulation: With patient Time For Goal Achievement: 02/13/18 Potential to Achieve Goals: Good    Frequency BID   Barriers to discharge Inaccessible home environment      Co-evaluation               AM-PAC PT "6 Clicks" Daily Activity  Outcome Measure Difficulty turning over in bed (including adjusting bedclothes, sheets and blankets)?: Unable Difficulty moving from lying on back to sitting on the side of the bed? : Unable Difficulty sitting down on and standing up from a chair with arms (e.g., wheelchair, bedside commode, etc,.)?: Unable Help needed moving to and from a bed to chair (including a wheelchair)?: A Little Help needed walking in hospital room?: A Lot Help needed climbing 3-5 steps with a railing? : A Lot 6 Click Score: 10    End of Session Equipment Utilized During Treatment: Gait belt Activity Tolerance: Patient tolerated treatment well Patient left: in chair;with call bell/phone within  reach;with chair alarm set;with SCD's reapplied Nurse Communication: Mobility status PT Visit Diagnosis: Unsteadiness on feet (R26.81);Muscle weakness (generalized) (M62.81);Difficulty in walking, not elsewhere classified (R26.2)    Time: 4098-1191 PT Time Calculation (min) (ACUTE ONLY): 42 min   Charges:   PT Evaluation $PT Eval Low Complexity: 1 Low PT Treatments $Therapeutic Exercise: 8-22 mins $Therapeutic Activity: 8-22 mins        D. Elly Modena PT, DPT 01/31/18, 12:05 PM

## 2018-01-31 NOTE — Progress Notes (Signed)
Drain removed without issue. Site cleaned and dressed with 2x2 and tegaderm. Bleeding controlled.

## 2018-01-31 NOTE — Evaluation (Signed)
Occupational Therapy Evaluation Patient Details Name: Lauren Roman MRN: [id number] DOB: 07-29-1928 Today's Date: 01/31/2018    History of Present Illness Pt is an 84 yo female who presented with lumbar radiculopathy.  She failed conservative management and elected for surgical intervention.  Pt is now s/p L4-5 lumbar laminectomy and far lateral discectomy.  PMH includes: dementia, myasthenia gravis (pt/daughter deny this diagnosis), arthritis, right hand tremor, and Hep-B.     Clinical Impression   Pt is 84 year old female s/p L4-5 lumbar laminectomy and far lateral discectomy with past medical hx as stated above.  Pt's son lives with her at home and her daughter Olegario Messier lives near by and was present for evaluation session. Pt was independent in all ADLs prior to surgery and was active mowing the grass and taking care of her yard. She is eager to return to PLOF.  Pt is currently limited in functional ADLs due to decreased ROM, and precautions for no trunk flexion, bending or twisting.  Pt requires mod assist and cues for LB dressing and bathing skills due to these limitations.  She does not have any pain at this time. She has been sponge bathing for last month due to back pain but prior to this she was getting down into tub to bathe using a grab bar to get up out of tub.  She has a small bathroom and no room next to or around toilet for any grab bars or commode seat to go over toilet.   She would benefit from continued skilled OT services for education in assistive devices, functional mobility, and education in recommendations for home modifications to increase safety and prevent falls.  Pt is a good candidate for SNF to continue rehabilitation.      Follow Up Recommendations  SNF    Equipment Recommendations  Tub/shower seat    Recommendations for Other Services       Precautions / Restrictions Precautions Precautions: Fall;Back Precaution Comments: No back brace  required Restrictions Weight Bearing Restrictions: No Other Position/Activity Restrictions: No bending, arching, twisting      Mobility Bed Mobility Overal bed mobility: Needs Assistance Bed Mobility: Sidelying to Sit;Sit to Sidelying   Sidelying to sit: Min assist     Sit to sidelying: Min assist General bed mobility comments: Mod verbal cues for log roll technique sequencing during bed mobility training  Transfers Overall transfer level: Needs assistance Equipment used: Rolling walker (2 wheeled) Transfers: Sit to/from Stand Sit to Stand: Min assist         General transfer comment: Mod verbal cues for proper sequencing    Balance Overall balance assessment: Mild deficits observed, not formally tested                                         ADL either performed or assessed with clinical judgement   ADL Overall ADL's : Needs assistance/impaired Eating/Feeding: Independent;Set up   Grooming: Wash/dry hands;Wash/dry face;Oral care;Brushing hair;Set up;Independent           Upper Body Dressing : Independent;Set up   Lower Body Dressing: With adaptive equipment;Set up;Moderate assistance;Sit to/from stand Lower Body Dressing Details (indicate cue type and reason): Pt did well but needs mod assist and cues for sequencing and problem solving when using reacher and sock aid.               General ADL Comments:  Pt uses a FWW at home for ambulation and her son lives with her and her daughter Olegario MessierKathy (present for eval ) plans to be present as well.  Rec Life Line which was discussed with daughter.  Pt has a reacher at home she can use but will need a sock aid and LH shoe horn.     Vision Patient Visual Report: No change from baseline       Perception     Praxis      Pertinent Vitals/Pain Pain Assessment: No/denies pain     Hand Dominance Right   Extremity/Trunk Assessment Upper Extremity Assessment Upper Extremity Assessment: Generalized  weakness   Lower Extremity Assessment Lower Extremity Assessment: Defer to PT evaluation       Communication Communication Communication: No difficulties   Cognition Arousal/Alertness: Awake/alert Behavior During Therapy: WFL for tasks assessed/performed Overall Cognitive Status: Within Functional Limits for tasks assessed                                     General Comments       Exercises Total Joint Exercises Ankle Circles/Pumps: AROM;Strengthening;Both;10 reps Quad Sets: Strengthening;Both;10 reps Gluteal Sets: Strengthening;Both;10 reps Heel Slides: AROM;Both;10 reps Hip ABduction/ADduction: AROM;Both;5 reps Straight Leg Raises: AROM;Both;5 reps Long Arc Quad: AROM;Both;10 reps Knee Flexion: AROM;Both;10 reps Marching in Standing: AROM;Both;5 reps;Standing Other Exercises Other Exercises: Back precaution education Other Exercises: Log roll technique education/training   Shoulder Instructions      Home Living Family/patient expects to be discharged to:: Private residence Living Arrangements: Other relatives Available Help at Discharge: Family;Available 24 hours/day Type of Home: House Home Access: Stairs to enter Entergy CorporationEntrance Stairs-Number of Steps: 2 Entrance Stairs-Rails: Right Home Layout: One level     Bathroom Shower/Tub: Tub/shower unit;Curtain   FirefighterBathroom Toilet: Standard Bathroom Accessibility: No   Home Equipment: Environmental consultantWalker - 2 wheels;Grab bars - tub/shower          Prior Functioning/Environment Level of Independence: Independent        Comments: Pt Ind with amb without an AD, no fall history, Ind with ADLs, active in the yard pulling weeds and mowing        OT Problem List: Decreased knowledge of use of DME or AE;Decreased activity tolerance      OT Treatment/Interventions: Self-care/ADL training;DME and/or AE instruction;Patient/family education    OT Goals(Current goals can be found in the care plan section) Acute Rehab OT  Goals Patient Stated Goal: to go home and start being active again OT Goal Formulation: With patient/family Time For Goal Achievement: 02/14/18 Potential to Achieve Goals: Good ADL Goals Pt Will Perform Lower Body Dressing: with set-up;with supervision;with adaptive equipment;sit to/from stand Pt Will Transfer to Toilet: with set-up;with supervision;regular height toilet;stand pivot transfer  OT Frequency: Min 1X/week   Barriers to D/C:            Co-evaluation              AM-PAC PT "6 Clicks" Daily Activity     Outcome Measure Help from another person eating meals?: None Help from another person taking care of personal grooming?: None Help from another person toileting, which includes using toliet, bedpan, or urinal?: A Little Help from another person bathing (including washing, rinsing, drying)?: A Little Help from another person to put on and taking off regular upper body clothing?: None Help from another person to put on and taking off regular lower body  clothing?: A Lot 6 Click Score: 20   End of Session    Activity Tolerance: Patient tolerated treatment well Patient left: in chair;with call bell/phone within reach;with chair alarm set;with family/visitor present  OT Visit Diagnosis: Muscle weakness (generalized) (M62.81);Unsteadiness on feet (R26.81)                Time: 1610-9604 OT Time Calculation (min): 38 min Charges:  OT General Charges $OT Visit: 1 Visit OT Evaluation $OT Eval Low Complexity: 1 Low OT Treatments $Self Care/Home Management : 23-37 mins  Susanne Borders, OTR/L ascom [phone] 01/31/18, 12:36 PM

## 2018-01-31 NOTE — Progress Notes (Signed)
Procedure: L4-5 lumbar laminectomy and far lateral discectomy  Procedure Date: 01/30/2018 Diagnosis: Lumbar radiculopathy  History: Lauren Roman is POD1  for L4-5 lumbar decompression with far lateral discectomy. No pain or complaints at this time. Symptoms prior to surgery have resolved. She has not yet ambulated or eaten. Voiding without issue.  Denies back pain, lower extremity pain/numbness/tingling.  Drain output reported at 90  Physical Exam: Vitals:   01/31/18 0405 01/31/18 0719  BP: (!) 151/57 (!) 174/66  Pulse: (!) 56 71  Resp: 19 16  Temp: 97.6 F (36.4 C) 97.7 F (36.5 C)  SpO2: 97% 99%    AA Ox3 Skin: 2 incision sites: No bleeding, glue intact.  Drain site intact and dressed.  No bleeding. Strength:5/5 lower extremities Sensation: intact and symmetric lower extremities.    Data:  No results for input(s): NA, K, CL, CO2, BUN, CREATININE, LABGLOM, GLUCOSE, CALCIUM in the last 168 hours. No results for input(s): AST, ALT, ALKPHOS in the last 168 hours.  Invalid input(s): TBILI   No results for input(s): WBC, HGB, HCT, PLT in the last 168 hours. Recent Labs  Lab 01/29/18 1244  APTT 31  INR 1.00         Other tests/results: No imaging reviewed  Assessment/Plan:  Lauren Roman is POD 1 status post L4-5 lumbar laminectomy and far lateral discectomy.  Awaiting PT and OT evaluation to determine ambulation status. [facility]uled with [facility] to receive home PT.  Continue to manage pain with Tylenol, tramadol, dexamethasone, and Robaxin as needed.  - mobilize - pain control - DVT prophylaxis - PTOT  Ivar DrapeAmanda Shani Fitch PA-C Department of Neurosurgery

## 2018-01-31 NOTE — Progress Notes (Signed)
Physical Therapy Treatment Patient Details Name: Lauren Roman MRN: [id number] DOB: Dec 25, 1928 Today's Date: 01/31/2018    History of Present Illness Pt is an 84 yo female who presented with lumbar radiculopathy.  She failed conservative management and elected for surgical intervention.  Pt is now s/p L4-5 lumbar laminectomy and far lateral discectomy.  PMH includes: dementia, myasthenia gravis (pt/daughter deny this diagnosis), arthritis, right hand tremor, and Hep-B.      PT Comments    Pt presents with deficits in strength, transfers, mobility, gait, balance, and activity tolerance but made very good progress towards goals this session.  Pt was able to stand with CGA and cues for proper sequencing.  Pt did present with min instability upon initial stand but was able to self-correct without therapist intervention.  Pt was able to amb 125 feet with CGA and a RW with improved posture and cadence with good stability.  Pt was able to ascend and descend two stairs with one rail and HHA with verbal cues for sequencing.  Pt demonstrated good stability and control during stair training with daughter present and educated on proper sequencing.  [name] stated that she would be able to stay with pt as needed at home upon discharge.  Pt will benefit from HHPT services and supervision with mobility/OOB activities upon discharge to safely address above deficits for decreased caregiver assistance and eventual return to PLOF.     Follow Up Recommendations  Home health PT;Supervision for mobility/OOB     Equipment Recommendations  None recommended by PT    Recommendations for Other Services       Precautions / Restrictions Precautions Precautions: Fall;Back Precaution Comments: No back brace required Restrictions Weight Bearing Restrictions: No Other Position/Activity Restrictions: No bending, arching, twisting    Mobility  Bed Mobility               General bed mobility comments:  NT, pt in recliner  Transfers Overall transfer level: Needs assistance Equipment used: Rolling walker (2 wheeled) Transfers: Sit to/from Stand Sit to Stand: Min guard         General transfer comment: Mod verbal cues for proper sequencing  Ambulation/Gait Ambulation/Gait assistance: Min guard Gait Distance (Feet): 125 Feet x 1, 15 Feet x 1 Assistive device: Rolling walker (2 wheeled) Gait Pattern/deviations: Step-through pattern;Decreased step length - right;Decreased step length - left Gait velocity: Decreased   General Gait Details: Min verbal cues for upright posture and amb closer to the RW   Stairs Stairs: Yes Stairs assistance: Min guard Stair Management: One rail Right Number of Stairs: 2 General stair comments: One rail and the other hand with HHA; mod verbal and tactile cues for proper sequencing with daughter present for training; pt steady with good eccentric and concentric control   Wheelchair Mobility    Modified Rankin (Stroke Patients Only)       Balance Overall balance assessment: Mild deficits observed, not formally tested                                          Cognition Arousal/Alertness: Awake/alert   Overall Cognitive Status: Within Functional Limits for tasks assessed                                        Exercises Total  Joint Exercises Ankle Circles/Pumps: AROM;Both;10 reps Quad Sets: Strengthening;Both;10 reps Long Arc Quad: AROM;Both;10 reps Knee Flexion: AROM;Both;10 reps Marching in Standing: AROM;Both;Standing;10 reps Other Exercises Other Exercises: Back precaution education/review with pt and pt's daughter    General Comments        Pertinent Vitals/Pain Pain Assessment: No/denies pain    Home Living                      Prior Function            PT Goals (current goals can now be found in the care plan section) Progress towards PT goals: Progressing toward goals     Frequency    BID      PT Plan Discharge plan needs to be updated    Co-evaluation              AM-PAC PT "6 Clicks" Daily Activity  Outcome Measure                   End of Session Equipment Utilized During Treatment: Gait belt Activity Tolerance: Patient tolerated treatment well Patient left: in chair;with call bell/phone within reach;with chair alarm set;with SCD's reapplied Nurse Communication: Mobility status PT Visit Diagnosis: Unsteadiness on feet (R26.81);Muscle weakness (generalized) (M62.81);Difficulty in walking, not elsewhere classified (R26.2)     Time: [id number] PT Time Calculation (min) (ACUTE ONLY): 38 min  Charges:  $Gait Training: 8-22 mins $Therapeutic Exercise: 8-22 mins $Therapeutic Activity: 8-22 mins                     D. Scott Ayinde Swim PT, DPT 01/31/18, 4:34 PM

## 2018-01-31 NOTE — Discharge Instructions (Signed)
°Your surgeon has performed an operation on your lumbar spine (low back) to relieve pressure on one or more nerves. Many times, patients feel better immediately after surgery and can “overdo it.” Even if you feel well, it is important that you follow these activity guidelines. If you do not let your back heal properly from the surgery, you can increase the chance of a disc herniation and/or return of your symptoms. The following are instructions to help in your recovery once you have been discharged from the hospital. ° ° °Activity  °  °No bending, lifting, or twisting (“BLT”). Avoid lifting objects heavier than 10 pounds (gallon milk jug).  Where possible, avoid household activities that involve lifting, bending, pushing, or pulling such as laundry, vacuuming, grocery shopping, and childcare. Try to arrange for help from friends and family for these activities while your back heals. ° °Increase physical activity slowly as tolerated.  Taking short walks is encouraged, but avoid strenuous exercise. Do not jog, run, bicycle, lift weights, or participate in any other exercises unless specifically allowed by your doctor. Avoid prolonged sitting, including car rides. ° °Talk to your doctor before resuming sexual activity. ° °You should not drive until cleared by your doctor. ° °Until released by your doctor, you should not return to work or school.  You should rest at home and let your body heal.  ° °You may shower two days after your surgery.  After showering, lightly dab your incision dry. Do not take a tub bath or go swimming for 3 weeks, or until approved by your doctor at your follow-up appointment. ° °If you smoke, we strongly recommend that you quit.  Smoking has been proven to interfere with normal healing in your back and will dramatically reduce the success rate of your surgery. Please contact [facility] (800-QUIT-NOW) and use the resources at www.[facility].com for assistance in stopping smoking. ° °Surgical  Incision °  °If you have a dressing on your incision, you may remove it three days after your surgery. Keep your incision area clean and dry. ° °If you have staples or stitches on your incision, you should have a follow up scheduled for removal. If you do not have staples or stitches, you will have steri-strips (small pieces of surgical tape) or Dermabond glue. The steri-strips/glue should begin to peel away within about a week (it is fine if the steri-strips fall off before then). If the strips are still in place one week after your surgery, you may gently remove them. ° °Diet          ° ° You may return to your usual diet. Be sure to stay hydrated. ° °When to Contact Us ° °Although your surgery and recovery will likely be uneventful, you may have some residual numbness, aches, and pains in your back and/or legs. This is normal and should improve in the next few weeks. ° °However, should you experience any of the following, contact us immediately: °• New numbness or weakness °• Pain that is progressively getting worse, and is not relieved by your pain medications or rest °• Bleeding, redness, swelling, pain, or drainage from surgical incision °• Chills or flu-like symptoms °• Fever greater than 101.0 F (38.3 C) °• Problems with bowel or bladder functions °• Difficulty breathing or shortness of breath °• Warmth, tenderness, or swelling in your calf ° °Contact Information °• During office hours (Monday-Friday 9 am to 5 pm), please call your physician at [phone] °• After hours and weekends, please call the   [facility] at [phone] and ask for the Neurosurgery Resident On Call  °• For a life-threatening emergency, call 911 ° ° °

## 2018-01-31 NOTE — Clinical Social Work Note (Signed)
Clinical Social Work Assessment  Patient Details  Name: Lauren Roman MRN: [id number] Date of Birth: 1929-01-03  Date of referral:  01/31/18               Reason for consult:  Facility Placement                Permission sought to share information with:    Permission granted to share information::     Name::        Agency::     Relationship::     Contact Information:     Housing/Transportation Living arrangements for the past 2 months:  Single Family Home Source of Information:  Patient, Adult Children Patient Interpreter Needed:  None Criminal Activity/Legal Involvement Pertinent to Current Situation/Hospitalization:  No - Comment as needed Significant Relationships:  Adult Children Lives with:  Siblings Do you feel safe going back to the place where you live?  Yes Need for family participation in patient care:  Yes (Comment)  Care giving concerns:  Patient lives in [place] with her brother Fritz Pickerel.    Social Worker assessment / plan:  Holiday representative (CSW) received verbal consult from PT that recommendation is SNF. Patient is under medicare observation, which means medicare will not pay for SNF. CSW met with patient and her daughter Juliann Pulse was at bedside. Patient was alert and oriented X4 and was sitting up in the chair at bedside. CSW introduced self and explained role of CSW department. Per patient she lives with her bother and wants to go home. CSW explained that PT is recommending SNF however medicare will not pay for it. CSW explained private pay SNF options. Patient reported that she doesn't want to private pay for SNF and wants to go home. Per patient her brother will be able to assist her. Patient is agreeable to home health. RN case manager aware of above. CSW will continue to follow and assist as needed.   Employment status:  Retired Forensic scientist:  Medicare PT Recommendations:  [place] / Referral to community resources:   Other (Comment Required)(Patient prefers to D/C home with home health. )  Patient/Family's Response to care:  Patient will D/C home.   Patient/Family's Understanding of and Emotional Response to Diagnosis, Current Treatment, and Prognosis:  Patient was very pleasant and thanked CSW for assistance.   Emotional Assessment Appearance:  Appears stated age Attitude/Demeanor/Rapport:    Affect (typically observed):  Accepting, Adaptable, Pleasant Orientation:  Oriented to Self, Oriented to Place, Oriented to  Time, Oriented to Situation Alcohol / Substance use:  Not Applicable Psych involvement (Current and /or in the community):  No (Comment)  Discharge Needs  Concerns to be addressed:  Discharge Planning Concerns Readmission within the last 30 days:  No Current discharge risk:  Dependent with Mobility Barriers to Discharge:  Continued Medical Work up   [employer], Veronia Beets, LCSW 01/31/2018, 2:12 PM

## 2018-01-31 NOTE — Care Management (Signed)
Home Health services have been prearranged with [facility] prior to admission. Patients daughter wants a hospital bed for patient although there is no medical indication for one that RNCM can see at this point. Requesting Neurosurgery please advise and document rational for Medicare.

## 2018-01-31 NOTE — Progress Notes (Signed)
Pt. Discharged to home via daughters vehicle. Discharge instructions and medication regimen reviewed at bedside with patient. Pt and daughter verbalizes understanding of instructions and medication regimen. Prescriptions given to pt and daughter. Patient assessment unchanged from this morning. IV discontinued per policy. VSS.

## 2018-01-31 NOTE — NC FL2 (Signed)
[place] MEDICAID FL2 LEVEL OF CARE SCREENING TOOL     IDENTIFICATION  Patient Name: Lauren Roman Birthdate: 18-Dec-1928 Sex: female Admission Date (Current Location): 01/30/2018  [place] and [place] Number:  Chiropodist and Address:  [facility], [address], [place], [place] [address]      Provider Number: [id number]  Attending Physician Name and Address:  Venetia Night, MD  Relative Name and Phone Number:       Current Level of Care: Hospital Recommended Level of Care: Skilled Nursing Facility Prior Approval Number:    Date Approved/Denied:   PASRR Number: [id number])  Discharge Plan: SNF    Current Diagnoses: Patient Active Problem List   Diagnosis Date Noted  . Lumbar radiculopathy 01/30/2018    Orientation RESPIRATION BLADDER Height & Weight     Self, Time, Situation, Place  Normal Continent Weight: 114 lb 6.7 oz (51.9 kg) Height:  4' (121.9 cm)  BEHAVIORAL SYMPTOMS/MOOD NEUROLOGICAL BOWEL NUTRITION STATUS      Continent Diet(Diet: Regular )  AMBULATORY STATUS COMMUNICATION OF NEEDS Skin   Extensive Assist Verbally Surgical wounds(Incision: Back)                       Personal Care Assistance Level of Assistance  Bathing, Feeding, Dressing Bathing Assistance: Limited assistance Feeding assistance: Independent Dressing Assistance: Limited assistance     Functional Limitations Info  Sight, Hearing, Speech Sight Info: Adequate Hearing Info: Adequate Speech Info: Adequate    SPECIAL CARE FACTORS FREQUENCY  PT (By licensed PT), OT (By licensed OT)     PT Frequency: (5) OT Frequency: (5)            Contractures      Additional Factors Info  Code Status, Allergies Code Status Info: (Full Code. ) Allergies Info: (Exelon Rivastigmine Tartrate, Other)           Current Medications (01/31/2018):  This is the current hospital active medication list Current Facility-Administered Medications   Medication Dose Route Frequency Provider Last Rate Last Dose  . 0.9 %  sodium chloride infusion   Intravenous Continuous Ivar Drape, PA-C 75 mL/hr at 01/30/18 2036    . 0.9 %  sodium chloride infusion  250 mL Intravenous Continuous Ivar Drape, PA-C   Stopped at 01/31/18 0804  . acetaminophen (TYLENOL) tablet 650 mg  650 mg Oral Q4H PRN Ivar Drape, PA-C       Or  . acetaminophen (TYLENOL) suppository 650 mg  650 mg Rectal Q4H PRN Ivar Drape, PA-C      . acetaminophen (TYLENOL) tablet 1,000 mg  1,000 mg Oral Q6H Ivar Drape, PA-C   1,000 mg at 01/31/18 0533  . dexamethasone (DECADRON) injection 4 mg  4 mg Intravenous Q8H Ivar Drape, PA-C   4 mg at 01/31/18 1305  . gabapentin (NEURONTIN) capsule 300 mg  300 mg Oral TID Ivar Drape, PA-C   300 mg at 01/31/18 0840  . menthol-cetylpyridinium (CEPACOL) lozenge 3 mg  1 lozenge Oral PRN Ivar Drape, PA-C       Or  . phenol (CHLORASEPTIC) mouth spray 1 spray  1 spray Mouth/Throat PRN Ivar Drape, PA-C      . methocarbamol (ROBAXIN) tablet 500 mg  500 mg Oral Q6H PRN Ivar Drape, PA-C       Or  . methocarbamol (ROBAXIN) 500 mg in dextrose 5 % 50 mL IVPB  500 mg Intravenous Q6H PRN Ivar Drape, PA-C      .  ondansetron (ZOFRAN) tablet 4 mg  4 mg Oral Q6H PRN Ivar DrapeFerri, Amanda, PA-C       Or  . ondansetron [facility](ZOFRAN) injection 4 mg  4 mg Intravenous Q6H PRN Ivar DrapeFerri, Amanda, PA-C      . senna-docusate (Senokot-S) tablet 1 tablet  1 tablet Oral QHS PRN Ivar DrapeFerri, Amanda, PA-C      . sodium chloride flush (NS) 0.9 % injection 3 mL  3 mL Intravenous Q12H Ivar DrapeFerri, Amanda, PA-C   3 mL at 01/31/18 0842  . sodium chloride flush (NS) 0.9 % injection 3 mL  3 mL Intravenous PRN Ivar DrapeFerri, Amanda, PA-C      . traMADol Janean Sark(ULTRAM) tablet 50 mg  50 mg Oral Q4H PRN Ivar DrapeFerri, Amanda, PA-C   50 mg at 01/31/18 0840     Discharge Medications: Please see discharge summary for a list of discharge medications.  Relevant Imaging Results:  Relevant Lab  Results:   Additional Information (SSN: [id number])  Laia Wiley, Darleen CrockerBailey M, LCSW

## 2018-02-01 NOTE — Discharge Summary (Signed)
  Procedure: L4-5 lumbar laminectomy and far lateral discectomy  Procedure Date: 01/30/2018 Diagnosis: Lumbar radiculopathy  History: Lauren Roman is POD1  for L4-5 lumbar decompression with far lateral discectomy. No pain or complaints at this time. Symptoms prior to surgery have resolved. She has not yet ambulated or eaten. Voiding without issue.  Denies back pain, lower extremity pain/numbness/tingling.  Drain output reported at 90  Physical Exam: Vitals:   01/31/18 1617 01/31/18 1855  BP: (!) 147/57 135/66  Pulse: 85 87  Resp: 16 16  Temp: 97.6 F (36.4 C) 97.7 F (36.5 C)  SpO2: 99% 99%    AA Ox3 Skin: 2 incision sites: No bleeding, glue intact.  Drain site intact and dressed.  No bleeding. Strength:5/5 lower extremities Sensation: intact and symmetric lower extremities.    Data:  No results for input(s): NA, K, CL, CO2, BUN, CREATININE, LABGLOM, GLUCOSE, CALCIUM in the last 168 hours. No results for input(s): AST, ALT, ALKPHOS in the last 168 hours.  Invalid input(s): TBILI   No results for input(s): WBC, HGB, HCT, PLT in the last 168 hours. Recent Labs  Lab 01/29/18 1244  APTT 31  INR 1.00         Other tests/results: No imaging reviewed  Assessment/Plan:  Lauren Roman is POD 1 status post L4-5 lumbar laminectomy and far lateral discectomy.  [facility] with [facility] to receive home PT.  Continue to manage pain with Tylenol, tramadol, dexamethasone, and Robaxin as needed. Drain removed without issue. Discussed wound care and physical activity restrictions. She will follow up in clinic in 2 weeks to monitor progress. Advised to contact office if any questions or concerns arise.   Ivar DrapeAmanda Signe Tackitt PA-C Department of Neurosurgery

## 2018-05-22 ENCOUNTER — Ambulatory Visit: Payer: Medicare Other | Attending: Neurosurgery

## 2018-05-22 DIAGNOSIS — M5442 Lumbago with sciatica, left side: Secondary | ICD-10-CM | POA: Insufficient documentation

## 2018-05-22 DIAGNOSIS — M6281 Muscle weakness (generalized): Secondary | ICD-10-CM | POA: Diagnosis present

## 2018-05-22 DIAGNOSIS — G8929 Other chronic pain: Secondary | ICD-10-CM | POA: Diagnosis present

## 2018-05-23 NOTE — Therapy (Signed)
[facility] [address], [place], [address] Phone: [phone]   Fax:  [phone]  Physical Therapy Evaluation  Patient Details  Name: Lauren Roman MRN: [id number] Date of Birth: 02-02-1929 Referring Provider (PT): Volanda Napoleon MD   Encounter Date: 05/22/2018  PT End of Session - 05/23/18 1348    Visit Number  1    Number of Visits  13    Date for PT Re-Evaluation  07/03/18    Authorization Type  1 /10 Medicare    PT Start Time  1345    PT Stop Time  1453    PT Time Calculation (min)  68 min    Activity Tolerance  Patient tolerated treatment well    Behavior During Therapy  [facility] for tasks assessed/performed       Past Medical History:  Diagnosis Date  . Arthritis   . Dementia (HCC)    mild  . Headache    migraines  . Hepatitis 1952   hep b-no problems since then  . Hyperlipidemia   . Myasthenia gravis (HCC)   . Tremor    right hand    Past Surgical History:  Procedure Laterality Date  . COLONOSCOPY    . LUMBAR LAMINECTOMY/DECOMPRESSION MICRODISCECTOMY N/A 01/30/2018   Procedure: LUMBAR LAMINECTOMY/DECOMPRESSION MICRODISCECTOMY 1 LEVEL-L4-5 FAR LATERAL DISCECTOMY;  Surgeon: Venetia Night, MD;  Location: [facility] ORS;  Service: Neurosurgery;  Laterality: N/A;    There were no vitals filed for this visit.   Subjective Assessment - 05/22/18 1522    Subjective  Patient reports increased pain and spasm and states the pain is secondary to walking, sitting, and standing for prolonged periods of time and reports she has been overall inactive since her surgery. Patient reports she feels she is 'very bent over' compared to how she was before the surgery. Patient states she would like to improve her overall function with ability to walk for longer periods of time, perform greater amount of standing, and perform outdoor activities such as lawn care. Patient reports she has pain along the lateral  aspect of her L knee and on the bottom of her foot.     Pertinent History  laminectomy,     Limitations  Sitting;Lifting;Standing;Walking    Patient Stated Goals  To return to mowing the lawn    Currently in Pain?  Yes    Pain Score  4    wong-baker scale   Pain Location  Back    Pain Orientation  Left    Pain Descriptors / Indicators  Aching    Pain Type  Chronic pain    Pain Radiating Towards  into the foot     Pain Onset  More than a month ago    Pain Frequency  Intermittent         [facility] PT Assessment - 05/23/18 1751      Assessment   Medical Diagnosis  S/P lamenectomy    Referring Provider (PT)  Volanda Napoleon MD    Onset Date/Surgical Date  02/04/18    Hand Dominance  Right    Next MD Visit  unknown    Prior Therapy  yes      Balance Screen   Has the patient fallen in the past 6 months  Yes    How many times?  1    Has the patient had a decrease in activity level because of a fear of falling?   Yes  Is the patient reluctant to leave their home because of a fear of falling?   No      Home Public house managernvironment   Living Environment  Private residence    Living Arrangements  Spouse/significant other    Additional Comments  Stairs 2 - 3 stairs      Prior Function   Level of Independence  Independent    Vocation  Retired    GafferVocation Requirements  N/A    Leisure  mowing the Building surveyorlawn       Cognition   Overall Cognitive Status  Within Functional Limits for tasks assessed      Observation/Other Assessments   Observations  Slow to respond to question ([facility] be [facility])      Sensation   Light Touch  Appears Intact      Functional Tests   Functional tests  Sit to Stand      Sit to Stand   Comments  Slow performance requires multiple attempts to perform       Posture/Postural Control   Posture Comments  Increased kyphosis in standing/sitting      ROM / Strength   AROM / PROM / Strength  AROM;Strength      AROM   AROM Assessment Site  Hip;Lumbar;Knee    Right/Left  Hip  Right;Left    Right Hip Flexion  100    Right Hip External Rotation   20    Right Hip Internal Rotation   10    Right Hip ABduction  25    Left Hip Flexion  100    Left Hip External Rotation   20    Left Hip Internal Rotation   10    Left Hip ABduction  25    Right/Left Knee  Left;Right    Right Knee Extension  -10    Right Knee Flexion  100    Left Knee Extension  -10    Left Knee Flexion  100    Lumbar Flexion  WNL   Increased pain at end range   Lumbar Extension  10% increased pain    Lumbar - Right Side Bend  33%    Lumbar - Left Side Bend  33%    Lumbar - Right Rotation  33%    Lumbar - Left Rotation  33%      Strength   Strength Assessment Site  Hip;Lumbar    Right/Left Hip  Right;Left    Right Hip Flexion  5/5    Right Hip External Rotation   4+/5    Right Hip Internal Rotation  4+/5    Right Hip ABduction  4/5    Right Hip ADduction  4/5    Left Hip Flexion  4+/5    Left Hip External Rotation  4+/5    Left Hip Internal Rotation  4+/5    Left Hip ABduction  4/5    Left Hip ADduction  4/5    Right/Left Knee  Right;Left    Right Knee Flexion  4+/5    Right Knee Extension  5/5    Left Knee Flexion  4+/5    Left Knee Extension  4+/5      Palpation   Palpation comment  Increased TTP along low back and foot      Transfers   Five time sit to stand comments   68 sec      Ambulation/Gait   Gait Comments  Decreased stride length, decreased foot clearance B, decreased heel strike  Standardized Balance Assessment   Standardized Balance Assessment  10 meter walk test;Timed Up and Go Test    10 Meter Walk  .444      Timed Up and Go Test   Normal TUG (seconds)  20        Objective measurements completed on examination: See above findings.   TREATMENT Therapeutic Exercise Sit to stands -- x 10 performed quickly Ambulation -- 2 x 51ft  Patient demonstrates increased fatigue at the end of the session. Performed to improve functional LE strength and  balance      PT Education - 05/23/18 1346    Education Details  HEP: Sit to stands, ambulation; POC; form/technique with exercise    Person(s) Educated  Patient    Methods  Explanation;Demonstration    Comprehension  Verbalized understanding;Returned demonstration          PT Long Term Goals - 05/23/18 1405      PT LONG TERM GOAL #1   Title  Patient will be independent with HEP to continue benefits of therapy after discharge    Baseline  Dependent with HEP for progression and performance    Time  6    Period  Weeks    Status  New    Target Date  07/03/18      PT LONG TERM GOAL #2   Title  Patient will improve 5xSTS from standard height chair to under 20sec improve fall risk.     Baseline  68secs    Time  6    Period  Weeks    Status  New    Target Date  07/03/18      PT LONG TERM GOAL #3   Title  Patient will improve TUG to under 12 sec to decrease fall risk and greater safety with ambulation.    Baseline  20sec    Time  6    Period  Weeks    Status  New    Target Date  07/03/18      PT LONG TERM GOAL #4   Title  Patient will improve under 12sec to improve fall risk and improve community ambulation.     Baseline  22sec    Time  6    Period  Weeks    Status  New    Target Date  07/03/18      PT LONG TERM GOAL #5   Title  Patient will demonstrates a worst pain of under 4/10 to indicate significant improvement in pain accoridng to the NPRS.     Baseline  worst pain 10/10    Time  6    Period  Weeks    Status  New    Target Date  07/03/18             Plan - 05/23/18 1351    Clinical Impression Statement  Patient is an 85 yo right hand dominant female presenting with increased pain and spasms along her lumbar region with radiating symptoms into her L LE. Patient demonstrates lumbar dysfunction with possible sciatic nerve involvement based on symptoms and reproduction of symptoms. Patient also demonstrates significant increase in fall risk  compared to the previous session as indicated by decreased 5xSTS, , and TUG scores. Patient demonstrates decreased strength, coordination and endurance and will benefit from further skilled therapy to return to prior level of function.     History and Personal Factors relevant to plan of care:  laminectomy, chronicity of pain  Clinical Presentation  Evolving    Clinical Presentation due to:  Unreliability of symptoms     Clinical Decision Making  Moderate    Rehab Potential  Fair    Clinical Impairments Affecting Rehab Potential  (+) highly motivated (-) Chronicity of pain    PT Frequency  2x / week    PT Duration  6 weeks    PT Treatment/Interventions  Therapeutic activities;Therapeutic exercise;Electrical Stimulation;Cryotherapy;Iontophoresis 4mg /ml Dexamethasone;Moist Heat;Patient/family education;Manual techniques;Balance training;Gait training;Spinal Manipulations;Joint Manipulations;Dry needling;Neuromuscular re-education    PT Next Visit Plan  progress lumbar AROM, strenthening and balance    PT Home Exercise Plan  see education section    Consulted and Agree with Plan of Care  Patient       Patient will benefit from skilled therapeutic intervention in order to improve the following deficits and impairments:  Abnormal gait, Pain, Increased fascial restricitons, Decreased mobility, Decreased endurance, Decreased range of motion, Decreased activity tolerance, Difficulty walking, Decreased balance, Decreased strength  Visit Diagnosis: Chronic bilateral low back pain with left-sided sciatica  Muscle weakness (generalized)     Problem List Patient Active Problem List   Diagnosis Date Noted  . Lumbar radiculopathy 01/30/2018    Myrene GalasWesley Jestina Stephani, PT DPT 05/23/2018, 6:19 PM  [facility] [address] [place], [place], [address] Phone: [phone]   Fax:  [phone]  Name: Lauren Roman MRN: [id number] Date  of Birth: 1928/07/11

## 2018-05-28 ENCOUNTER — Ambulatory Visit: Payer: Medicare Other

## 2018-05-28 DIAGNOSIS — M5442 Lumbago with sciatica, left side: Secondary | ICD-10-CM | POA: Diagnosis not present

## 2018-05-28 DIAGNOSIS — G8929 Other chronic pain: Secondary | ICD-10-CM

## 2018-05-28 DIAGNOSIS — M6281 Muscle weakness (generalized): Secondary | ICD-10-CM

## 2018-05-28 NOTE — Therapy (Signed)
[facility] PH[facility] AND SPOR[facility] MEDICINE [address] [place], [place], [address] Phone: [phone]   Fax:  [phone]  Physical Therapy Treatment  Patient Details  Name: Lauren PickettDorothy G Roman MRN: [id number] Date of Birth: Apr 11, 1929 Referring Provider (PT): Volanda NapoleonYarborough Chester MD   Encounter Date: 05/28/2018  PT End of Session - 05/28/18 1500    Visit Number  2    Number of Visits  13    Authorization Type  2/10    PT Start Time  1302    PT Stop Time  1345    PT Time Calculation (min)  43 min    Equipment Utilized During Treatment  Gait belt       Past Medical History:  Diagnosis Date  . Arthritis   . Dementia (HCC)    mild  . Headache    migraines  . Hepatitis 1952   hep b-no problems since then  . Hyperlipidemia   . Myasthenia gravis (HCC)   . Tremor    right hand    Past Surgical History:  Procedure Laterality Date  . COLONOSCOPY    . LUMBAR LAMINECTOMY/DECOMPRESSION MICRODISCECTOMY N/A 01/30/2018   Procedure: LUMBAR LAMINECTOMY/DECOMPRESSION MICRODISCECTOMY 1 LEVEL-L4-5 FAR LATERAL DISCECTOMY;  Surgeon: Venetia NightYarbrough, Chester, MD;  Location: [facility] ORS;  Service: Neurosurgery;  Laterality: N/A;    There were no vitals filed for this visit.  Subjective Assessment - 05/28/18 1451    Subjective  Pt reports that she has felt general fatigue. Pt reports that LBP remains "about the same" and has been performing HEP of lumbar towel roll when seated and practicing sit-to-stand with no UE support. Pt continues to have concern about being "hunched over".  No reports from pt about N/T in bottom of foot.    Pertinent History  laminectomy,     Limitations  Sitting;Lifting;Standing;Walking    Patient Stated Goals  To return to mowing the lawn    Pain Onset  More than a month ago       TREATMENT Therapeutic Exercise Ambulation: 200 feet Seated lumbar extensions on half bolster: 1x12 Seated mid-thoracic extensions on half bolster:  1x12 Sit-to-stand: 1x10 Standing THB rows (Yellow): 2x12 Lateral step-ups 6" step B (CGA with gait belt): 1x10  Therapeutic exercise to improve lumbar and thoracic AROM to mainly improve extension, improve LBP, and to strengthen generalized LE muscle weakness. Lateral step-ups for LE strengthening and balance.   PT Education - 05/28/18 1455    Education Details  Pt educated on contracting glutes, moving hips forward and arms overhead during sit to stand exercise. Pt educated on putting knot of theraband all the way between door and door frame to anchor it and prevent snapping of theraband. Pt also educated on having chair behind her when performing standing theraband rows at home to prevent falling and to use for resting between sets if feeling fatigue.      Person(s) Educated  Patient;Child(ren)    Methods  Explanation;Demonstration;Tactile cues;Verbal cues;Handout    Comprehension  Verbalized understanding;Returned demonstration;Need further instruction;Verbal cues required;Tactile cues required          PT Long Term Goals - 05/23/18 1405      PT LONG TERM GOAL #1   Title  Patient will be independent with HEP to continue benefits of therapy after discharge    Baseline  Dependent with HEP for progression and performance    Time  6    Period  Weeks    Status  New  Target Date  07/03/18      PT LONG TERM GOAL #2   Title  Patient will improve 5xSTS from standard height chair to under 20sec improve fall risk.     Baseline  68secs    Time  6    Period  Weeks    Status  New    Target Date  07/03/18      PT LONG TERM GOAL #3   Title  Patient will improve TUG to under 12 sec to decrease fall risk and greater safety with ambulation.    Baseline  20sec    Time  6    Period  Weeks    Status  New    Target Date  07/03/18      PT LONG TERM GOAL #4   Title  Patient will improve under 12sec to improve fall risk and improve community ambulation.     Baseline  22sec    Time   6    Period  Weeks    Status  New    Target Date  07/03/18      PT LONG TERM GOAL #5   Title  Patient will demonstrates a worst pain of under 4/10 to indicate significant improvement in pain accoridng to the NPRS.     Baseline  worst pain 10/10    Time  6    Period  Weeks    Status  New    Target Date  07/03/18            Plan - 05/28/18 1502    Clinical Impression Statement  Pt feels fatigued at end of session indicating poor strength and muscular endurance.  = Despite fatigue and pt concerns, pt was able to ambulate ~200 feet with only minor fatigue afterwards. Pt needs continued tactile and verbal cueing with exercises indicating poor muscular coordination. Pt requires continued skilled treatment to improve balance, generalized LE muscle weakness, and LBP to return pt to prior level of function.      Clinical Impairments Affecting Rehab Potential  (+) highly motivated (-) Chronicity of pain    PT Frequency  2x / week    PT Duration  6 weeks    PT Treatment/Interventions  Therapeutic activities;Therapeutic exercise;Electrical Stimulation;Cryotherapy;Iontophoresis 4mg /ml Dexamethasone;Moist Heat;Patient/family education;Manual techniques;Balance training;Gait training;Spinal Manipulations;Joint Manipulations;Dry needling;Neuromuscular re-education    PT Next Visit Plan  progress lumbar AROM, strenthening and balance, Nu Step    PT Home Exercise Plan  see education section    Consulted and Agree with Plan of Care  Patient       Patient will benefit from skilled therapeutic intervention in order to improve the following deficits and impairments:     Visit Diagnosis: Chronic bilateral low back pain with left-sided sciatica  Muscle weakness (generalized)     Problem List Patient Active Problem List   Diagnosis Date Noted  . Lumbar radiculopathy 01/30/2018    Laurence Spates 05/28/2018, 3:10 PM  [facility] PHYSICAL AND SPORTS  MEDICINE [address] [place], [address] Phone: [phone]   Fax:  [phone]  Name: Lauren Roman MRN: [id number] Date of Birth: June 02, 1928

## 2018-05-30 ENCOUNTER — Ambulatory Visit: Payer: Medicare Other

## 2018-05-30 DIAGNOSIS — M5442 Lumbago with sciatica, left side: Principal | ICD-10-CM

## 2018-05-30 DIAGNOSIS — G8929 Other chronic pain: Secondary | ICD-10-CM

## 2018-05-30 DIAGNOSIS — M6281 Muscle weakness (generalized): Secondary | ICD-10-CM

## 2018-05-30 NOTE — Therapy (Signed)
[place] [facility] PHYSICAL AND [facility]TS MEDICINE [address] [place], [place], [address] Phone: [phone]   Fax:  [phone]  Physical Therapy Treatment  Patient Details  Name: Lauren Roman MRN: [id number] Date of Birth: 1928-11-07 Referring Provider (PT): Volanda NapoleonYarborough Chester MD   Encounter Date: 05/30/2018  PT End of Session - 05/30/18 1625    Visit Number  3    Number of Visits  13    Authorization Type  3/10    PT Start Time  1515    PT Stop Time  1600    PT Time Calculation (min)  45 min    Equipment Utilized During Treatment  Gait belt    Activity Tolerance  Patient tolerated treatment well;No increased pain    Behavior During Therapy  WFL for tasks assessed/performed       Past Medical History:  Diagnosis Date  . Arthritis   . Dementia (HCC)    mild  . Headache    migraines  . Hepatitis 1952   hep b-no problems since then  . Hyperlipidemia   . Myasthenia gravis (HCC)   . Tremor    right hand    Past Surgical History:  Procedure Laterality Date  . COLONOSCOPY    . LUMBAR LAMINECTOMY/DECOMPRESSION MICRODISCECTOMY N/A 01/30/2018   Procedure: LUMBAR LAMINECTOMY/DECOMPRESSION MICRODISCECTOMY 1 LEVEL-L4-5 FAR LATERAL DISCECTOMY;  Surgeon: Venetia NightYarbrough, Chester, MD;  Location: [facility];  Service: Neurosurgery;  Laterality: N/A;    There were no vitals filed for this visit.  Subjective Assessment - 05/30/18 1618    Subjective  Pt reports LBP remains the same as previous session. Pt has not been performing HEP and it seems to be due to confusion on which exercises to perform and because her yellow theraband tore. Pt also reports pain in her foot from what she called an "ingrown toenail". Pt continues to state that she wishes to "stand up straight" and not be "hunched over" and that she wants to "get back to how she used to be".     Patient is accompained by:  Family member    Pertinent History  laminectomy,     Limitations   Sitting;Lifting;Standing;Walking    Patient Stated Goals  To return to mowing the lawn    Currently in Pain?  Yes    Pain Score  4     Pain Location  Back    Pain Orientation  Left;Lower    Pain Descriptors / Indicators  Aching    Pain Onset  More than a month ago      TREATMENT Therapeutic Exercise Nu-Step (Level 3): 6 min Sit-to-stand no UE support: 2x10  Standing TB (Yellow) Rows: 2x15 Walking backwards: 20 m   Pushing Ergotron to mimic pushing a lawnmower (30 lbs ankle weights) 8x10 m navigating around stool and balance stones Standing physioball (yellow) overhead raises: 2x10  Therapeutic exercise to improve LE strength/endurance. Standing exercises to improve AROM and strength. Mimic pushing lawnmower to return to prior ADL's.  PT Education - 05/30/18 1622    Education Details  Pt and family member educated on new technique to use theraband on door so it does not tear. Form/technique with exercise. Educated pt and family member to concentrate on two exercises for the HEP.    Person(s) Educated  Patient;Child(ren)    Methods  Explanation;Verbal cues    Comprehension  Verbalized understanding;Returned demonstration;Need further instruction;Verbal cues required          PT Long Term Goals -  05/23/18 1405      PT LONG TERM GOAL #1   Title  Patient will be independent with HEP to continue benefits of therapy after discharge    Baseline  Dependent with HEP for progression and performance    Time  6    Period  Weeks    Status  New    Target Date  07/03/18      PT LONG TERM GOAL #2   Title  Patient will improve 5xSTS from standard height chair to under 20sec improve fall risk.     Baseline  68secs    Time  6    Period  Weeks    Status  New    Target Date  07/03/18      PT LONG TERM GOAL #3   Title  Patient will improve TUG to under 12 sec to decrease fall risk and greater safety with ambulation.    Baseline  20sec    Time  6    Period  Weeks    Status  New     Target Date  07/03/18      PT LONG TERM GOAL #4   Title  Patient will improve under 12sec to improve fall risk and improve community ambulation.     Baseline  22sec    Time  6    Period  Weeks    Status  New    Target Date  07/03/18      PT LONG TERM GOAL #5   Title  Patient will demonstrates a worst pain of under 4/10 to indicate significant improvement in pain accoridng to the NPRS.     Baseline  worst pain 10/10    Time  6    Period  Weeks    Status  New    Target Date  07/03/18            Plan - 05/30/18 1626    Clinical Impression Statement  Pt reports no increase in back pain after treatment. Pt was able to perform all exercises with less fatigue and fewer rest breaks. Pt was capable of mimicing mowing the lawn by pushing a weighted cart for ~80 m. Pt requires verbal and tacticle cueing for exercises. Pt demonstrates decreased LE extension AROM and strength when ambulating backwards leading to decreased balance and coordination. Pt can benefit from further treatment to improve LE strength and endurance and to decrease LBP in order to return to prior levels of function.     Clinical Presentation  Evolving    Clinical Decision Making  Moderate    Clinical Impairments Affecting Rehab Potential  (+) highly motivated (-) Chronicity of pain    PT Frequency  2x / week    PT Duration  6 weeks    PT Treatment/Interventions  Therapeutic activities;Therapeutic exercise;Electrical Stimulation;Cryotherapy;Iontophoresis 4mg /ml Dexamethasone;Moist Heat;Patient/family education;Manual techniques;Balance training;Gait training;Spinal Manipulations;Joint Manipulations;Dry needling;Neuromuscular re-education    PT Next Visit Plan  progress lumbar AROM, strengthening and balance, Nu Step    PT Home Exercise Plan  see education section    Consulted and Agree with Plan of Care  Patient       Patient will benefit from skilled therapeutic intervention in order to improve the following  deficits and impairments:  Abnormal gait, Pain, Increased fascial restricitons, Decreased mobility, Decreased endurance, Decreased range of motion, Decreased activity tolerance, Difficulty walking, Decreased balance, Decreased strength  Visit Diagnosis: Chronic bilateral low back pain with left-sided sciatica  Muscle weakness (generalized)  Problem List Patient Active Problem List   Diagnosis Date Noted  . Lumbar radiculopathy 01/30/2018    Clair Gullingyler Leiani Enright, SPT 05/30/2018, 4:31 PM  [facility] [address] [place], [place], [address] Phone: [phone]   Fax:  [phone]  Name: Lauren Roman MRN: [id number] Date of Birth: 18-Jun-1928

## 2018-06-03 ENCOUNTER — Ambulatory Visit: Payer: Medicare Other

## 2018-06-06 ENCOUNTER — Ambulatory Visit: Payer: Medicare Other

## 2018-06-06 DIAGNOSIS — M5442 Lumbago with sciatica, left side: Principal | ICD-10-CM

## 2018-06-06 DIAGNOSIS — M6281 Muscle weakness (generalized): Secondary | ICD-10-CM

## 2018-06-06 DIAGNOSIS — G8929 Other chronic pain: Secondary | ICD-10-CM

## 2018-06-06 NOTE — Therapy (Signed)
[place] [facility] PH[facility] SPORTS MEDICINE [address], [place], [address] Phone: [phone]   Fax:  [phone]  Physical Therapy Treatment  Patient Details  Name: Lauren Roman MRN: [id number] Date of Birth: Oct 21, 1928 Referring Provider (PT): Volanda Napoleon MD   Encounter Date: 06/06/2018  PT End of Session - 06/06/18 1441    Visit Number  4    Number of Visits  13    Authorization Type  4/10    PT Start Time  1345    PT Stop Time  1430    PT Time Calculation (min)  45 min    Equipment Utilized During Treatment  Gait belt    Activity Tolerance  Patient tolerated treatment well;No increased pain    Behavior During Therapy  WFL for tasks assessed/performed       Past Medical History:  Diagnosis Date  . Arthritis   . Dementia (HCC)    mild  . Headache    migraines  . Hepatitis 1952   hep b-no problems since then  . Hyperlipidemia   . Myasthenia gravis (HCC)   . Tremor    right hand    Past Surgical History:  Procedure Laterality Date  . COLONOSCOPY    . LUMBAR LAMINECTOMY/DECOMPRESSION MICRODISCECTOMY N/A 01/30/2018   Procedure: LUMBAR LAMINECTOMY/DECOMPRESSION MICRODISCECTOMY 1 LEVEL-L4-5 FAR LATERAL DISCECTOMY;  Surgeon: Venetia Night, MD;  Location: [facility];  Service: Neurosurgery;  Laterality: N/A;    There were no vitals filed for this visit.  Subjective Assessment - 06/06/18 1436    Subjective  Pt states she was unable to attend last session due to daughter being sick and not being bale to give her a ride. Pt reports feeling fatigued and that her LBP "feels better" but states she is experiencing pain down her left leg. Pt reports almost falling performing standing scapular rows with TB. Pt stated she "forgot" about having chair behind her at home for safety and to reduce risk of fall.    Patient is accompained by:  Family member    Pertinent History  laminectomy,     Limitations   Sitting;Lifting;Standing;Walking    Patient Stated Goals  To return to mowing the lawn    Currently in Pain?  Yes    Pain Location  Leg    Pain Orientation  Left    Pain Descriptors / Indicators  Sharp    Pain Type  Chronic pain    Pain Onset  More than a month ago      TREATMENT THERAPEUTIC EXERCISE Ambulation: 200 feet Sit-to-stands focusing on improving power: 2x15 Walking backwards focusing on back and hip extension by increasing stride length (HHA): Down and back 20 feet x5 Standing back extension: 1x15 Standing scapular rows with TB (red); chair behind pt for safety: 2x20 Gait training: Focusing on larger strides and standing tall: Down and back 20 feet x5  Pushing Ergotron cart mimicking pushing a lawnmower navigating balance stones: 30lbs ankle weights, 5 min  Therapeutic exercises to focus on improved LE power to improve LE strength to benefit pt with ADL's such as mowing the lawn. Exercises to benefit spinal and periscapular musculature to improve posture. Gait training backwards to improve posterior chain extension to improve posture, LE strength, and power. Forward gait training increasing stride length to improve balance.  PT Education - 06/06/18 1440    Education Details  Form/education with exercises. Pt re-educated on having chair behind her at home when performing HEP of  standing scapular rows.    Person(s) Educated  Patient    Methods  Explanation;Demonstration;Tactile cues;Verbal cues;Handout    Comprehension  Verbalized understanding;Returned demonstration;Verbal cues required;Need further instruction          PT Long Term Goals - 05/23/18 1405      PT LONG TERM GOAL #1   Title  Patient will be independent with HEP to continue benefits of therapy after discharge    Baseline  Dependent with HEP for progression and performance    Time  6    Period  Weeks    Status  New    Target Date  07/03/18      PT LONG TERM GOAL #2   Title  Patient will improve 5xSTS  from standard height chair to under 20sec improve fall risk.     Baseline  68secs    Time  6    Period  Weeks    Status  New    Target Date  07/03/18      PT LONG TERM GOAL #3   Title  Patient will improve TUG to under 12 sec to decrease fall risk and greater safety with ambulation.    Baseline  20sec    Time  6    Period  Weeks    Status  New    Target Date  07/03/18      PT LONG TERM GOAL #4   Title  Patient will improve under 12sec to improve fall risk and improve community ambulation.     Baseline  22sec    Time  6    Period  Weeks    Status  New    Target Date  07/03/18      PT LONG TERM GOAL #5   Title  Patient will demonstrates a worst pain of under 4/10 to indicate significant improvement in pain accoridng to the NPRS.     Baseline  worst pain 10/10    Time  6    Period  Weeks    Status  New    Target Date  07/03/18            Plan - 06/06/18 1442    Clinical Impression Statement  Pt reports a decrease in her LBP but pain in her left leg. Depsite pt feeling "weaker" today, pt required fewer rest breaks and was able to increase resistance with TB scapular rows. Pt was also able to perform increased number of sit-to-stands at an increased rate, proving carryover from HEP and previous treatments. Pt continues to feel she is "humped" over but pt is standing and walking in a more neutral position. Although pt is improving with posture, LE strength/power, and periscapular strength, pt can continue to benefit skilled PT treatment. She can benefit in order to decrease LBP, continue to improve posture, LE strength/power so pt can return to PLOF.    Clinical Impairments Affecting Rehab Potential  (+) highly motivated (-) Chronicity of pain    PT Frequency  2x / week    PT Duration  6 weeks    PT Treatment/Interventions  Therapeutic activities;Therapeutic exercise;Electrical Stimulation;Cryotherapy;Iontophoresis 4mg /ml Dexamethasone;Moist Heat;Patient/family  education;Manual techniques;Balance training;Gait training;Spinal Manipulations;Joint Manipulations;Dry needling;Neuromuscular re-education    PT Next Visit Plan  progress lumbar AROM, strengthening and balance, Nu Step    PT Home Exercise Plan  see education section    Consulted and Agree with Plan of Care  Patient       Patient will benefit from skilled therapeutic  intervention in order to improve the following deficits and impairments:  Abnormal gait, Pain, Increased fascial restricitons, Decreased mobility, Decreased endurance, Decreased range of motion, Decreased activity tolerance, Difficulty walking, Decreased balance, Decreased strength  Visit Diagnosis: Chronic bilateral low back pain with left-sided sciatica  Muscle weakness (generalized)     Problem List Patient Active Problem List   Diagnosis Date Noted  . Lumbar radiculopathy 01/30/2018    Clair Gullingyler Jabree Pernice, SPT 06/06/2018, 2:47 PM  [place] [facility]ICINE [address] [place], [place], [address] Phone: [phone]   Fax:  [phone]  Name: Larene PickettDorothy G Scovill MRN: [id number] Date of Birth: 1928-06-23

## 2018-06-18 ENCOUNTER — Ambulatory Visit: Payer: Medicare Other | Attending: Neurosurgery

## 2018-06-18 DIAGNOSIS — G8929 Other chronic pain: Secondary | ICD-10-CM | POA: Insufficient documentation

## 2018-06-18 DIAGNOSIS — M6281 Muscle weakness (generalized): Secondary | ICD-10-CM | POA: Diagnosis present

## 2018-06-18 DIAGNOSIS — M5442 Lumbago with sciatica, left side: Secondary | ICD-10-CM | POA: Diagnosis present

## 2018-06-18 NOTE — Therapy (Signed)
[facility] [address] [place], [address] Phone: [phone]   Fax:  [phone]  Physical Therapy Treatment  Patient Details  Name: DEARA BRICCO MRN: [id number] Date of Birth: 01-Apr-1929 Referring Provider (PT): Volanda Napoleon MD   Encounter Date: 06/18/2018  PT End of Session - 06/18/18 1632    Visit Number  5    Number of Visits  13    Date for PT Re-Evaluation  07/03/18    Authorization Type  5/10    PT Start Time  1430    PT Stop Time  1520    PT Time Calculation (min)  50 min    Equipment Utilized During Treatment  Gait belt    Activity Tolerance  Patient tolerated treatment well;No increased pain    Behavior During Therapy  WFL for tasks assessed/performed       Past Medical History:  Diagnosis Date  . Arthritis   . Dementia (HCC)    mild  . Headache    migraines  . Hepatitis 1952   hep b-no problems since then  . Hyperlipidemia   . Myasthenia gravis (HCC)   . Tremor    right hand    Past Surgical History:  Procedure Laterality Date  . COLONOSCOPY    . LUMBAR LAMINECTOMY/DECOMPRESSION MICRODISCECTOMY N/A 01/30/2018   Procedure: LUMBAR LAMINECTOMY/DECOMPRESSION MICRODISCECTOMY 1 LEVEL-L4-5 FAR LATERAL DISCECTOMY;  Surgeon: Venetia Night, MD;  Location: [facility]S;  Service: Neurosurgery;  Laterality: N/A;    There were no vitals filed for this visit.  Subjective Assessment - 06/18/18 1626    Subjective  Pt reports not sleeping well last night and that she is fatigued. Pt exclaims she had pain in her left hip 8/10 NPRS with N/T radiating distally into left plantar surface of foot. Pt states concern about not making improvements and that she continues to feel "humped". Pt states she also worries about never being able to perform activities she enjoys such as mowing the lawn. Pt currently states her pain is 7/10 NPRS in posterior aspect of left hip with not N/T distally prior to  PT session.    Patient is accompained by:  Family member    Pertinent History  laminectomy,     Limitations  Sitting;Lifting;Standing;Walking    Patient Stated Goals  To return to mowing the lawn    Currently in Pain?  Yes    Pain Score  6     Pain Location  Hip    Pain Orientation  Left;Posterior    Pain Descriptors / Indicators  Sharp    Pain Type  Chronic pain    Pain Onset  More than a month ago      TREATMENT   THERAPEUTIC EXERCISE Nu-Step level 3: 9 min Standing lumbar extensions with table behind pt: 5x10 Reviewed performing shoulder rows at home with RTB  Education on Therapeutic Exercise: Patient educated on importance of exercise performance at home and continuing to perform exercises especially the exercises that address lumbar extension. Patient demonstrates difficulty in understanding and expresses she feels that "I won't get any better". Reviewed mindset and importance of movement with addressing pain and function.   Therapeutic exercise to improve LE strength/endurance on Nu-Step. Lumbar extensions to decrease left LE N/T symptoms and to improve lumbar spine mobility and paraspinal strength. Pt demonstrated increased fatigue due to left LE pain keeping her awake majority of night. Majority of treatment session was spent on communicating with pt  about importance of exercise/movement and its benefits to pain and long term function in being able to perform ADL's due to pt fatigue limiting ability to perform exercise.   PT Education - 06/18/18 1630    Education Details  Form/technique with exercise. Educated on standing back extensions with counter behind pt for safety in order to manage N/T symptoms in left posterior leg. Pt educated on importance of using a counter behind her to decrease fall risk. Pt was given a handout with exercise parameters on it to perform safely at home.   Person(s) Educated  Patient    Methods  Explanation;Demonstration;Tactile cues;Verbal cues     Comprehension  Verbalized understanding;Returned demonstration;Verbal cues required;Need further instruction          PT Long Term Goals - 05/23/18 1405      PT LONG TERM GOAL #1   Title  Patient will be independent with HEP to continue benefits of therapy after discharge    Baseline  Dependent with HEP for progression and performance    Time  6    Period  Weeks    Status  New    Target Date  07/03/18      PT LONG TERM GOAL #2   Title  Patient will improve 5xSTS from standard height chair to under 20sec improve fall risk.     Baseline  68secs    Time  6    Period  Weeks    Status  New    Target Date  07/03/18      PT LONG TERM GOAL #3   Title  Patient will improve TUG to under 12 sec to decrease fall risk and greater safety with ambulation.    Baseline  20sec    Time  6    Period  Weeks    Status  New    Target Date  07/03/18      PT LONG TERM GOAL #4   Title  Patient will improve 10mWTto under 12sec to improve fall risk and improve community ambulation.     Baseline  22sec    Time  6    Period  Weeks    Status  New    Target Date  07/03/18      PT LONG TERM GOAL #5   Title  Patient will demonstrates a worst pain of under 4/10 to indicate significant improvement in pain accoridng to the NPRS.     Baseline  worst pain 10/10    Time  6    Period  Weeks    Status  New    Target Date  07/03/18            Plan - 06/18/18 1633    Clinical Impression Statement  Pt reports an increase in pain today and reports not sleeping well previous night due to posterior left hip pain. Pt reported pain an 8/10 NPRS in left hip that radiated with N/T into plantar aspect of foot. Pt states feeling fatigued prior to session due to her symptoms keeping her awake. [name] states pt performing one of the exercises with HEP. During session pt communicated with SPT about her pain she has been experiencing since surgery and how frustrated she is not being able to perform activities  she enjoys such as gardening and mowing the lawn. Pt continued to communicate negatively about her current impairments and how she will "never get back to how I was". Pt's fatigue and communication limited pt in performing therapeutic  exercises during today's session. Next session SPT and PT plan to reassess pt's HEP and concentrate on navigating pt on working on exercises to manage pt's impairments. Pt can continue to benefit from skilled treatment to decrease pain, and improve pt's LE strength, and spina strength/ mobility.     Clinical Impairments Affecting Rehab Potential  (+) highly motivated (-) Chronicity of pain    PT Frequency  2x / week    PT Duration  6 weeks    PT Treatment/Interventions  Therapeutic activities;Therapeutic exercise;Electrical Stimulation;Cryotherapy;Iontophoresis 4mg /ml Dexamethasone;Moist Heat;Patient/family education;Manual techniques;Balance training;Gait training;Spinal Manipulations;Joint Manipulations;Dry needling;Neuromuscular re-education    PT Next Visit Plan  progress lumbar AROM, strengthening and balance, Nu Step    PT Home Exercise Plan  see education section    Consulted and Agree with Plan of Care  Patient       Patient will benefit from skilled therapeutic intervention in order to improve the following deficits and impairments:  Abnormal gait, Pain, Increased fascial restricitons, Decreased mobility, Decreased endurance, Decreased range of motion, Decreased activity tolerance, Difficulty walking, Decreased balance, Decreased strength  Visit Diagnosis: Chronic bilateral low back pain with left-sided sciatica  Muscle weakness (generalized)     Problem List Patient Active Problem List   Diagnosis Date Noted  . Lumbar radiculopathy 01/30/2018    Clair Gullingyler Doc Mandala, SPT 06/18/2018, 5:46 PM  [place] [facility] [address] [place], [place], [address] Phone: [phone]   Fax:   [phone]  Name: Larene PickettDorothy G Hoerner MRN: [id number] Date of Birth: 01/11/1929

## 2018-06-20 ENCOUNTER — Ambulatory Visit: Payer: Medicare Other

## 2018-06-20 ENCOUNTER — Other Ambulatory Visit: Payer: Self-pay | Admitting: Neurosurgery

## 2018-06-20 DIAGNOSIS — M51369 Other intervertebral disc degeneration, lumbar region without mention of lumbar back pain or lower extremity pain: Secondary | ICD-10-CM | POA: Insufficient documentation

## 2018-06-20 DIAGNOSIS — M5416 Radiculopathy, lumbar region: Secondary | ICD-10-CM

## 2018-06-20 DIAGNOSIS — M5136 Other intervertebral disc degeneration, lumbar region: Secondary | ICD-10-CM | POA: Insufficient documentation

## 2018-06-24 ENCOUNTER — Ambulatory Visit: Payer: Medicare Other

## 2018-06-24 DIAGNOSIS — M6281 Muscle weakness (generalized): Secondary | ICD-10-CM

## 2018-06-24 DIAGNOSIS — G8929 Other chronic pain: Secondary | ICD-10-CM

## 2018-06-24 DIAGNOSIS — M5442 Lumbago with sciatica, left side: Principal | ICD-10-CM

## 2018-06-24 NOTE — Therapy (Signed)
[facility] [address] [place], [place], [address] Phone: [phone]   Fax:  [phone]  Physical Therapy Treatment  Patient Details  Name: Lauren PickettDorothy G Richman MRN: [id number] Date of Birth: 07/07/1928 Referring Provider (PT): Volanda NapoleonYarborough Chester MD   Encounter Date: 06/24/2018  PT End of Session - 06/24/18 1607    Visit Number  6    Number of Visits  13    Date for PT Re-Evaluation  07/03/18    Authorization Type  6/10    PT Start Time  1515    PT Stop Time  1600    PT Time Calculation (min)  45 min    Equipment Utilized During Treatment  Gait belt    Activity Tolerance  Patient tolerated treatment well;No increased pain    Behavior During Therapy  WFL for tasks assessed/performed       Past Medical History:  Diagnosis Date  . Arthritis   . Dementia (HCC)    mild  . Headache    migraines  . Hepatitis 1952   hep b-no problems since then  . Hyperlipidemia   . Myasthenia gravis (HCC)   . Tremor    right hand    Past Surgical History:  Procedure Laterality Date  . COLONOSCOPY    . LUMBAR LAMINECTOMY/DECOMPRESSION MICRODISCECTOMY N/A 01/30/2018   Procedure: LUMBAR LAMINECTOMY/DECOMPRESSION MICRODISCECTOMY 1 LEVEL-L4-5 FAR LATERAL DISCECTOMY;  Surgeon: Venetia NightYarbrough, Chester, MD;  Location: [facility];  Service: Neurosurgery;  Laterality: N/A;    There were no vitals filed for this visit.  Subjective Assessment - 06/24/18 1518    Subjective  Pt reports having improved sleep and that her PCP changed her pain meds and states it has helped her. [name] reports that pt is having nerve conduction test done later in March. Pt reports decrease in LBP since previous session. No pain prior reported prior to this session.    Patient is accompained by:  Family member    Pertinent History  laminectomy,     Limitations  Sitting;Lifting;Standing;Walking    Patient Stated Goals  To return to mowing the lawn     Currently in Pain?  No/denies    Pain Onset  More than a month ago     TREATMENT  THERAPEUTIC EXERCISE  Nu-Step level 3: 6 minutes 30 sec  Sit-to-stands: 2x10  Standing lumbar extension with back to table: 3x10   Walking backwards HHA focusing on LE extension: 20 feet x4  Standing Scapular rows with TB (Red): 2x12  Standing physioball overhead raises: 2x12  Standing physioball rotations to right and left: 2x12  Therapeutic exercises to improve LE strength and to improve spine mobility, spine strength, and periscapular strength. Periscapular strength and paraspinal strength to improve posture. Pt displays fatigue after session and no reports of LBP or N/T symptoms into left LE.  PT Education - 06/24/18 1523    Education Details  Form/technique with exercise. Introducing pain science education to patient and how our environment can affect our pain.     Person(s) Educated  Patient    Methods  Explanation    Comprehension  Verbalized understanding;Returned demonstration          PT Long Term Goals - 05/23/18 1405      PT LONG TERM GOAL #1   Title  Patient will be independent with HEP to continue benefits of therapy after discharge    Baseline  Dependent with HEP for progression and performance  Time  6    Period  Weeks    Status  New    Target Date  07/03/18      PT LONG TERM GOAL #2   Title  Patient will improve 5xSTS from standard height chair to under 20sec improve fall risk.     Baseline  68secs    Time  6    Period  Weeks    Status  New    Target Date  07/03/18      PT LONG TERM GOAL #3   Title  Patient will improve TUG to under 12 sec to decrease fall risk and greater safety with ambulation.    Baseline  20sec    Time  6    Period  Weeks    Status  New    Target Date  07/03/18      PT LONG TERM GOAL #4   Title  Patient will improve 10mWTto under 12sec to improve fall risk and improve community ambulation.     Baseline  22sec    Time  6    Period  Weeks     Status  New    Target Date  07/03/18      PT LONG TERM GOAL #5   Title  Patient will demonstrates a worst pain of under 4/10 to indicate significant improvement in pain accoridng to the NPRS.     Baseline  worst pain 10/10    Time  6    Period  Weeks    Status  New    Target Date  07/03/18            Plan - 06/24/18 1607    Clinical Impression Statement  Pt was able to perform an increased amount of therapeutic exercise compared to previous session due to pt having improved sleep and decreased pain. Pt continues to display LBP, LE weakness, forward head posture with rounded shoulders, and requires frequent breaks between exercises. Pt can continue to benefit from skilled teratment to improve these deficits and return pt to PLOF. Patient demonstrates difficulty and slow performance of all exercises.    Clinical Impairments Affecting Rehab Potential  (+) highly motivated (-) Chronicity of pain    PT Frequency  2x / week    PT Duration  6 weeks    PT Treatment/Interventions  Therapeutic activities;Therapeutic exercise;Electrical Stimulation;Cryotherapy;Iontophoresis 4mg /ml Dexamethasone;Moist Heat;Patient/family education;Manual techniques;Balance training;Gait training;Spinal Manipulations;Joint Manipulations;Dry needling;Neuromuscular re-education    PT Next Visit Plan  progress lumbar AROM, strengthening and balance, Nu Step    PT Home Exercise Plan  see education section    Consulted and Agree with Plan of Care  Patient       Patient will benefit from skilled therapeutic intervention in order to improve the following deficits and impairments:  Abnormal gait, Pain, Increased fascial restricitons, Decreased mobility, Decreased endurance, Decreased range of motion, Decreased activity tolerance, Difficulty walking, Decreased balance, Decreased strength  Visit Diagnosis: Chronic bilateral low back pain with left-sided sciatica  Muscle weakness (generalized)     Problem  List Patient Active Problem List   Diagnosis Date Noted  . Lumbar radiculopathy 01/30/2018    Clair Gullingyler Chevy Virgo, SPT 06/24/2018, 4:13 PM  [facility] [address] [place], [place], [address] Phone: [phone]   Fax:  [phone]  Name: Lauren PickettDorothy G Theard MRN: [id number] Date of Birth: 1929/02/07

## 2018-06-27 ENCOUNTER — Ambulatory Visit: Payer: Medicare Other

## 2018-06-27 DIAGNOSIS — M5442 Lumbago with sciatica, left side: Principal | ICD-10-CM

## 2018-06-27 DIAGNOSIS — M6281 Muscle weakness (generalized): Secondary | ICD-10-CM

## 2018-06-27 DIAGNOSIS — G8929 Other chronic pain: Secondary | ICD-10-CM

## 2018-06-27 NOTE — Therapy (Signed)
[facility] [address] [place], [place], [address] Phone: [phone]   Fax:  [phone]  Physical Therapy Treatment  Patient Details  Name: Lauren PickettDorothy G Roman MRN: [id number] Date of Birth: [birth year] Referring Provider (PT): Volanda NapoleonYarborough Chester MD   Encounter Date: 06/27/2018  PT End of Session - 06/27/18 1553    Visit Number  7    Number of Visits  13    Date for PT Re-Evaluation  07/03/18    Authorization Type  7/10    PT Start Time  1500    PT Stop Time  1545    PT Time Calculation (min)  45 min    Equipment Utilized During Treatment  Gait belt    Activity Tolerance  Patient tolerated treatment well    Behavior During Therapy  [facility] for tasks assessed/performed       Past Medical History:  Diagnosis Date  . Arthritis   . Dementia (HCC)    mild  . Headache    migraines  . Hepatitis 1952   hep b-no problems since then  . Hyperlipidemia   . Myasthenia gravis (HCC)   . Tremor    right hand    Past Surgical History:  Procedure Laterality Date  . COLONOSCOPY    . LUMBAR LAMINECTOMY/DECOMPRESSION MICRODISCECTOMY N/A 01/30/2018   Procedure: LUMBAR LAMINECTOMY/DECOMPRESSION MICRODISCECTOMY 1 LEVEL-L4-5 FAR LATERAL DISCECTOMY;  Surgeon: Venetia NightYarbrough, Chester, MD;  Location: [facility];  Service: Neurosurgery;  Laterality: N/A;    There were no vitals filed for this visit.  Subjective Assessment - 06/27/18 1506    Subjective  Pt reports LBP but left LE pain has been improving. Pt reports leg pain in the evening after increase in activity such as putting away dishes.    Patient is accompained by:  Family member    Pertinent History  laminectomy,     Limitations  Sitting;Lifting;Standing;Walking    Patient Stated Goals  To return to mowing the lawn    Currently in Pain?  Yes    Pain Score  5     Pain Location  Leg    Pain Orientation  Left;Posterior    Pain Descriptors / Indicators  Sharp    Pain Type   Chronic pain    Pain Onset  More than a month ago      TREATMENT  THERAPEUTIC EXERCISE  Nu-Step level 3: 7 min 30 sec Sit to stands: 2x20  Lumbar extensions over counter: 1x15   B 6" step-up: 1x20 per side  B Lateral 6" step-up: 1x20 per side  Standing B hip abduction: 1x20, 2 lbs ankle weights   Standing B hip extension: 1x20, 2 lbs ankle   Seated slump neurodynamic glides: 1x10  Therapeutic exercises to improve LE strength to improve pt's ability to complete ADL's such as sitting to standing and mowing the lawn. Pt displayed increased fatigue after session. Pt required moderate tactile and verbal cueing during LE exercises to target correct musculature pt is trying to strengthen. Lumbar extensions and seated slump neurodynamic glides to improve N/T symptoms in leg. Pt exclaimed no improvement in symptoms with these two movements.  PT Education - 06/27/18 1549    Education Details  Form/technique with exercise. Educating pt that performing activities at previous level of function after surgery can increase pain due to overworking weak muscles.    Person(s) Educated  Patient    Methods  Explanation    Comprehension  Verbalized understanding;Returned demonstration  PT Long Term Goals - 05/23/18 1405      PT LONG TERM GOAL #1   Title  Patient will be independent with HEP to continue benefits of therapy after discharge    Baseline  Dependent with HEP for progression and performance    Time  6    Period  Weeks    Status  New    Target Date  07/03/18      PT LONG TERM GOAL #2   Title  Patient will improve 5xSTS from standard height chair to under 20sec improve fall risk.     Baseline  68secs    Time  6    Period  Weeks    Status  New    Target Date  07/03/18      PT LONG TERM GOAL #3   Title  Patient will improve TUG to under 12 sec to decrease fall risk and greater safety with ambulation.    Baseline  20sec    Time  6    Period  Weeks    Status  New    Target  Date  07/03/18      PT LONG TERM GOAL #4   Title  Patient will improve under 12sec to improve fall risk and improve community ambulation.     Baseline  22sec    Time  6    Period  Weeks    Status  New    Target Date  07/03/18      PT LONG TERM GOAL #5   Title  Patient will demonstrates a worst pain of under 4/10 to indicate significant improvement in pain accoridng to the NPRS.     Baseline  worst pain 10/10    Time  6    Period  Weeks    Status  New    Target Date  07/03/18            Plan - 06/27/18 1554    Clinical Impression Statement  Pt required fewer rest breaks between exercises. Pt was able to perform 40 sit to stands displaying improved LE strength and long term carryover. Pt continues to require moderate tactile cues and verbal cues for exercise. Pt continues to display LE weakness, LBP, and decreased endurance. Therefore pt can continue to benefit from skilled PT treatment to return pt to PLOF.       Patient will benefit from skilled therapeutic intervention in order to improve the following deficits and impairments:     Visit Diagnosis: Chronic bilateral low back pain with left-sided sciatica  Muscle weakness (generalized)     Problem List Patient Active Problem List   Diagnosis Date Noted  . Lumbar radiculopathy 01/30/2018    Clair Gulling, SPT 06/27/2018, 4:02 PM  [place] [facility] PHYSICAL AND SPORTS MEDICINE [address] [place], [address] Phone: [phone]   Fax:  [phone]  Name: Lauren Roman MRN: [id number] Date of Birth: 01/07/1929

## 2018-07-01 ENCOUNTER — Ambulatory Visit: Payer: Medicare Other

## 2018-07-01 DIAGNOSIS — G8929 Other chronic pain: Secondary | ICD-10-CM

## 2018-07-01 DIAGNOSIS — M5442 Lumbago with sciatica, left side: Principal | ICD-10-CM

## 2018-07-01 DIAGNOSIS — M6281 Muscle weakness (generalized): Secondary | ICD-10-CM

## 2018-07-01 NOTE — Therapy (Signed)
[facility] PH[facility]ORTS MEDICINE [address], [place], [address] Phone: [phone]   Fax:  [phone]  Physical Therapy Treatment  Patient Details  Name: Lauren Roman MRN: [id number] Date of Birth: Mar 01, 1929 Referring Provider (PT): Volanda Napoleon MD   Encounter Date: 07/01/2018  PT End of Session - 07/01/18 1713    Visit Number  8    Number of Visits  13    Date for PT Re-Evaluation  07/03/18    Authorization Type  8/10    PT Start Time  1515    PT Stop Time  1600    PT Time Calculation (min)  45 min    Equipment Utilized During Treatment  Gait belt    Activity Tolerance  Patient tolerated treatment well    Behavior During Therapy  [facility] for tasks assessed/performed       Past Medical History:  Diagnosis Date  . Arthritis   . Dementia (HCC)    mild  . Headache    migraines  . Hepatitis 1952   hep b-no problems since then  . Hyperlipidemia   . Myasthenia gravis (HCC)   . Tremor    right hand    Past Surgical History:  Procedure Laterality Date  . COLONOSCOPY    . LUMBAR LAMINECTOMY/DECOMPRESSION MICRODISCECTOMY N/A 01/30/2018   Procedure: LUMBAR LAMINECTOMY/DECOMPRESSION MICRODISCECTOMY 1 LEVEL-L4-5 FAR LATERAL DISCECTOMY;  Surgeon: Venetia Night, MD;  Location: [facility] ORS;  Service: Neurosurgery;  Laterality: N/A;    There were no vitals filed for this visit.  Subjective Assessment - 07/01/18 1711    Subjective  Patient reports she has been trying her best to do her exercises at home and is improving overall with performance.    Patient is accompained by:  Family member    Pertinent History  laminectomy,     Limitations  Sitting;Lifting;Standing;Walking    Patient Stated Goals  To return to mowing the lawn    Currently in Pain?  Yes    Pain Score  5     Pain Location  Leg    Pain Orientation  Posterior;Left    Pain Descriptors / Indicators  Aching;Sharp    Pain Type  Chronic pain    Pain  Onset  More than a month ago    Pain Frequency  Intermittent       TREATMENT Therapeutic Exercise Nustep level 1 with focus on keeping speed above 50spm - x5 min  Sit to stands with raising ball overhead with physioball - x 15 Side stepping over single hurdle - x 15 B Standing lumbar extension with towel behind back - x 15 Ambulation with focus on improving speed and heel strike- 6 x 25ft Ambulation backwards with focus on taking larger steps and improving step length - 3 x 46ft  Patient required frequent verbal cueing throughout the session to perform greater amount of lumbar/thoracic extension. Focused on heel striking with ambulation to improve speed and decrease fall risk.   PT Education - 07/01/18 1712    Education Details  Form/technique with exercise; Educated on posture throughout session    Person(s) Educated  Patient    Methods  Explanation;Demonstration    Comprehension  Verbalized understanding;Returned demonstration          PT Long Term Goals - 05/23/18 1405      PT LONG TERM GOAL #1   Title  Patient will be independent with HEP to continue benefits of therapy after discharge  Baseline  Dependent with HEP for progression and performance    Time  6    Period  Weeks    Status  New    Target Date  07/03/18      PT LONG TERM GOAL #2   Title  Patient will improve 5xSTS from standard height chair to under 20sec improve fall risk.     Baseline  68secs    Time  6    Period  Weeks    Status  New    Target Date  07/03/18      PT LONG TERM GOAL #3   Title  Patient will improve TUG to under 12 sec to decrease fall risk and greater safety with ambulation.    Baseline  20sec    Time  6    Period  Weeks    Status  New    Target Date  07/03/18      PT LONG TERM GOAL #4   Title  Patient will improve under 12sec to improve fall risk and improve community ambulation.     Baseline  22sec    Time  6    Period  Weeks    Status  New    Target Date  07/03/18       PT LONG TERM GOAL #5   Title  Patient will demonstrates a worst pain of under 4/10 to indicate significant improvement in pain accoridng to the NPRS.     Baseline  worst pain 10/10    Time  6    Period  Weeks    Status  New    Target Date  07/03/18            Plan - 07/01/18 1713    Clinical Impression Statement  Patient demonstrates overall improvement with walking speed after cueing to improve heel strike and stride length. Patient's decreased to 13.5sec from  17.5 second indicating decrease in fall risk. Patient demonstrates improvement in posture at the end of the session but requires frequent cueing. Patient will benefit from further skilled therapy to return to prior level of function.     Rehab Potential  Fair    Clinical Impairments Affecting Rehab Potential  (+) highly motivated (-) Chronicity of pain    PT Frequency  2x / week    PT Duration  6 weeks    PT Treatment/Interventions  Therapeutic activities;Therapeutic exercise;Electrical Stimulation;Cryotherapy;Iontophoresis 4mg /ml Dexamethasone;Moist Heat;Patient/family education;Manual techniques;Balance training;Gait training;Spinal Manipulations;Joint Manipulations;Dry needling;Neuromuscular re-education    PT Next Visit Plan  progress lumbar AROM, strengthening and balance, Nu Step    PT Home Exercise Plan  see education section    Consulted and Agree with Plan of Care  Patient       Patient will benefit from skilled therapeutic intervention in order to improve the following deficits and impairments:  Abnormal gait, Pain, Increased fascial restricitons, Decreased mobility, Decreased endurance, Decreased range of motion, Decreased activity tolerance, Difficulty walking, Decreased balance, Decreased strength  Visit Diagnosis: Chronic bilateral low back pain with left-sided sciatica  Muscle weakness (generalized)     Problem List Patient Active Problem List   Diagnosis Date Noted  . Lumbar radiculopathy  01/30/2018    Myrene Galas, PT DPT 07/01/2018, 5:16 PM  [place] [facility] PHYSICAL AND SPORTS MEDICINE [address], [place], [address] Phone: [phone]   Fax:  [phone]  Name: JODILYN BYARD MRN: [id number] Date of Birth: 1929-01-08

## 2018-07-03 ENCOUNTER — Ambulatory Visit
Admission: RE | Admit: 2018-07-03 | Discharge: 2018-07-03 | Disposition: A | Discharge status: Home / Self Care | Payer: Medicare Other | Source: Ambulatory Visit | Attending: Neurosurgery | Admitting: Neurosurgery

## 2018-07-03 DIAGNOSIS — M5416 Radiculopathy, lumbar region: Secondary | ICD-10-CM | POA: Diagnosis present

## 2018-07-04 ENCOUNTER — Ambulatory Visit: Payer: Medicare Other

## 2018-07-08 ENCOUNTER — Ambulatory Visit: Payer: Medicare Other

## 2018-07-08 DIAGNOSIS — M5442 Lumbago with sciatica, left side: Secondary | ICD-10-CM | POA: Diagnosis not present

## 2018-07-08 DIAGNOSIS — M6281 Muscle weakness (generalized): Secondary | ICD-10-CM

## 2018-07-08 DIAGNOSIS — G8929 Other chronic pain: Secondary | ICD-10-CM

## 2018-07-08 NOTE — Therapy (Signed)
[facility] [place] [facility] PHYS[facility]ORTS MEDICINE [address], [place], [address] Phone: [phone]   Fax:  [phone]  Physical Therapy Treatment  Patient Details  Name: Lauren Roman MRN: [id number] Date of Birth: [birth year] Referring Provider (PT): Volanda Napoleon MD   Encounter Date: 07/08/2018  PT End of Session - 07/08/18 1513    Visit Number  9    Number of Visits  13    Date for PT Re-Evaluation  07/03/18    Authorization Type  9/10    PT Start Time  1420    PT Stop Time  1510    PT Time Calculation (min)  50 min    Equipment Utilized During Treatment  Gait belt    Activity Tolerance  Patient tolerated treatment well    Behavior During Therapy  [facility] for tasks assessed/performed       Past Medical History:  Diagnosis Date  . Arthritis   . Dementia (HCC)    mild  . Headache    migraines  . Hepatitis 1952   hep b-no problems since then  . Hyperlipidemia   . Myasthenia gravis (HCC)   . Tremor    right hand    Past Surgical History:  Procedure Laterality Date  . COLONOSCOPY    . LUMBAR LAMINECTOMY/DECOMPRESSION MICRODISCECTOMY N/A 01/30/2018   Procedure: LUMBAR LAMINECTOMY/DECOMPRESSION MICRODISCECTOMY 1 LEVEL-L4-5 FAR LATERAL DISCECTOMY;  Surgeon: Venetia Night, MD;  Location: [facility] ORS;  Service: Neurosurgery;  Laterality: N/A;    There were no vitals filed for this visit.  Subjective Assessment - 07/08/18 1426    Subjective  Pt reports getting a "bad report" from Dr. with regards to lumbar spine MRI. Pt states "feeling good" after previous session. Pt states     Patient is accompained by:  Family member    Pertinent History  laminectomy,     Limitations  Sitting;Lifting;Standing;Walking    Patient Stated Goals  To return to mowing the lawn    Currently in Pain?  No/denies    Pain Onset  More than a month ago      TREATMENT  THERAPEUTIC EXERCISE   Nu-step level 2 keeping SPM above 50 to improve power: 5  min  Standing MWM lumbar extension with towel: 2x10   Standing scapular rows with TB (Green): 2x10  Sit-to-stands with physioball overhead: 2x10  B standing hip abduction with UE support: 1x12, no weight then 1x12, 2 lbs ankle weights  Therapeutic exercises to improve LE strength and focusing on lumbar and Hip extension for radiating pain on left LE. Pt displays increased fatigue after session. Moderate tactile and verbal cueing required.   PT Education - 07/08/18 1512    Education Details  Form/technique with exercise. Educated on benefits of exercise for herniated discs.    Person(s) Educated  Patient    Methods  Explanation;Demonstration    Comprehension  Verbalized understanding;Returned demonstration          PT Long Term Goals - 07/09/18 0926      PT LONG TERM GOAL #1   Title  Patient will be independent with HEP to continue benefits of therapy after discharge    Baseline  Dependent with HEP for progression and performance; 07/08/2018: moderate cueing for exercise performance- max cueing for exercise progression    Time  6    Period  Weeks    Status  On-going      PT LONG TERM GOAL #2   Title  Patient will  improve 5xSTS from standard height chair to under 20sec improve fall risk.     Baseline  68secs; 07/08/2018: 60sec    Time  6    Period  Weeks    Status  On-going      PT LONG TERM GOAL #3   Title  Patient will improve TUG to under 12 sec to decrease fall risk and greater safety with ambulation.    Baseline  20sec; 07/08/2018: 19sec    Time  6    Period  Weeks    Status  On-going      PT LONG TERM GOAL #4   Title  Patient will improve under 12sec to improve fall risk and improve community ambulation.     Baseline  22sec; 07/08/2018: 18sec    Time  6    Period  Weeks    Status  On-going      PT LONG TERM GOAL #5   Title  Patient will demonstrates a worst pain of under 4/10 to indicate significant improvement in pain accoridng to the NPRS.     Baseline   worst pain 10/10; 9/10 worst pain    Time  6    Period  Weeks    Status  On-going            Plan - 07/08/18 1513    Clinical Impression Statement  Pt demonstrates higher levels of fatigue due to not sleeping secondary to LBP. Pt continues to require frequent verbal and tactile cueing to receive full benefits from treatment session. Pt continues to demonstrate LBP, poor posture, and at risk of falls. Pt can continue to benefit from skilled PT treatment to return to PLOF. Patient making progress with long term goals and is improving overall with all functional testing, but most notably demonstrates improvement with . Patient will benefit from further skilled therapy to return to prior level of function.     Rehab Potential  Fair    Clinical Impairments Affecting Rehab Potential  (+) highly motivated (-) Chronicity of pain    PT Frequency  2x / week    PT Duration  6 weeks    PT Treatment/Interventions  Therapeutic activities;Therapeutic exercise;Electrical Stimulation;Cryotherapy;Iontophoresis 4mg /ml Dexamethasone;Moist Heat;Patient/family education;Manual techniques;Balance training;Gait training;Spinal Manipulations;Joint Manipulations;Dry needling;Neuromuscular re-education    PT Next Visit Plan  progress lumbar AROM, strengthening and balance, Nu Step    PT Home Exercise Plan  see education section    Consulted and Agree with Plan of Care  Patient       Patient will benefit from skilled therapeutic intervention in order to improve the following deficits and impairments:  Abnormal gait, Pain, Increased fascial restricitons, Decreased mobility, Decreased endurance, Decreased range of motion, Decreased activity tolerance, Difficulty walking, Decreased balance, Decreased strength  Visit Diagnosis: Chronic bilateral low back pain with left-sided sciatica  Muscle weakness (generalized)     Problem List Patient Active Problem List   Diagnosis Date Noted  . Lumbar radiculopathy  01/30/2018    Clair Gulling, SPT 07/09/2018, 9:35 AM  [facility] [place] [facility] PHYSICAL AND SPORTS MEDICINE [address], [place], [address] Phone: [phone]   Fax:  [phone]  Name: Lauren Roman MRN: [id number] Date of Birth: 1929-04-13

## 2018-07-09 NOTE — Addendum Note (Signed)
Addended by: Bethanie Dicker on: 07/09/2018 09:49 AM   Modules accepted: Orders

## 2018-07-11 ENCOUNTER — Ambulatory Visit: Payer: Medicare Other

## 2018-07-31 ENCOUNTER — Ambulatory Visit: Payer: Medicare Other

## 2018-08-06 ENCOUNTER — Ambulatory Visit: Payer: Medicare Other

## 2018-08-28 ENCOUNTER — Other Ambulatory Visit: Payer: Self-pay

## 2018-08-28 ENCOUNTER — Emergency Department: Payer: Medicare Other

## 2018-08-28 ENCOUNTER — Emergency Department
Admission: EM | Admit: 2018-08-28 | Discharge: 2018-08-28 | Disposition: A | Discharge status: Home / Self Care | Payer: Medicare Other | Attending: Emergency Medicine | Admitting: Emergency Medicine

## 2018-08-28 DIAGNOSIS — F0390 Unspecified dementia without behavioral disturbance: Secondary | ICD-10-CM | POA: Diagnosis not present

## 2018-08-28 DIAGNOSIS — N3090 Cystitis, unspecified without hematuria: Secondary | ICD-10-CM | POA: Diagnosis not present

## 2018-08-28 DIAGNOSIS — N3000 Acute cystitis without hematuria: Secondary | ICD-10-CM

## 2018-08-28 DIAGNOSIS — R112 Nausea with vomiting, unspecified: Secondary | ICD-10-CM

## 2018-08-28 DIAGNOSIS — Z87891 Personal history of nicotine dependence: Secondary | ICD-10-CM | POA: Diagnosis not present

## 2018-08-28 DIAGNOSIS — R1110 Vomiting, unspecified: Secondary | ICD-10-CM | POA: Insufficient documentation

## 2018-08-28 LAB — CBC WITH DIFFERENTIAL/PLATELET
Abs Immature Granulocytes: 0.03 10*3/uL (ref 0.00–0.07)
Basophils Absolute: 0 10*3/uL (ref 0.0–0.1)
Basophils Relative: 0 %
Eosinophils Absolute: 0 10*3/uL (ref 0.0–0.5)
Eosinophils Relative: 0 %
HCT: 40.6 % (ref 36.0–46.0)
Hemoglobin: 13.9 g/dL (ref 12.0–15.0)
Immature Granulocytes: 0 %
Lymphocytes Relative: 10 %
Lymphs Abs: 0.9 10*3/uL (ref 0.7–4.0)
MCH: 30.7 pg (ref 26.0–34.0)
MCHC: 34.2 g/dL (ref 30.0–36.0)
MCV: 89.6 fL (ref 80.0–100.0)
Monocytes Absolute: 0.6 10*3/uL (ref 0.1–1.0)
Monocytes Relative: 6 %
Neutro Abs: 7.4 10*3/uL (ref 1.7–7.7)
Neutrophils Relative %: 84 %
Platelets: 246 10*3/uL (ref 150–400)
RBC: 4.53 MIL/uL (ref 3.87–5.11)
RDW: 13.3 % (ref 11.5–15.5)
WBC: 8.9 10*3/uL (ref 4.0–10.5)
nRBC: 0 % (ref 0.0–0.2)

## 2018-08-28 LAB — COMPREHENSIVE METABOLIC PANEL
ALT: 12 U/L (ref 0–44)
AST: 32 U/L (ref 15–41)
Albumin: 4.2 g/dL (ref 3.5–5.0)
Alkaline Phosphatase: 54 U/L (ref 38–126)
Anion gap: 12 (ref 5–15)
BUN: 19 mg/dL (ref 8–23)
CO2: 18 mmol/L — ABNORMAL LOW (ref 22–32)
Calcium: 9.3 mg/dL (ref 8.9–10.3)
Chloride: 106 mmol/L (ref 98–111)
Creatinine, Ser: 0.71 mg/dL (ref 0.44–1.00)
GFR calc Af Amer: >60 mL/min (ref >60)
GFR calc non Af Amer: >60 mL/min (ref >60)
Glucose, Bld: 97 mg/dL (ref 70–99)
Potassium: 3.9 mmol/L (ref 3.5–5.1)
Sodium: 136 mmol/L (ref 135–145)
Total Bilirubin: 1.2 mg/dL (ref 0.3–1.2)
Total Protein: 7.5 g/dL (ref 6.5–8.1)

## 2018-08-28 LAB — TROPONIN I: Troponin I: <0.03 ng/mL (ref <0.03)

## 2018-08-28 LAB — URINALYSIS, COMPLETE (UACMP) WITH MICROSCOPIC
Bilirubin Urine: NEGATIVE
Glucose, UA: NEGATIVE mg/dL
Ketones, ur: 20 mg/dL — AB
Nitrite: NEGATIVE
Protein, ur: NEGATIVE mg/dL
Specific Gravity, Urine: 1.008 (ref 1.005–1.030)
Squamous Epithelial / LPF: NONE SEEN (ref 0–5)
pH: 7 (ref 5.0–8.0)

## 2018-08-28 LAB — LIPASE, BLOOD: Lipase: 19 U/L (ref 11–51)

## 2018-08-28 MED ORDER — CEPHALEXIN 500 MG PO CAPS: 500.0000 mg | ORAL_CAPSULE | Freq: Four times a day (QID) | ORAL | 0 refills | Status: DC

## 2018-08-28 MED ORDER — ONDANSETRON 4 MG PO TBDP: 4.0000 mg | ORAL_TABLET | Freq: Three times a day (TID) | ORAL | 0 refills | Status: DC | PRN

## 2018-08-28 MED ORDER — CEPHALEXIN 500 MG PO CAPS: 500.0000 mg | ORAL_CAPSULE | Freq: Four times a day (QID) | ORAL | 0 refills | Status: AC

## 2018-08-28 MED ORDER — SODIUM CHLORIDE 0.9 % IV BOLUS
500.0000 mL | Freq: Once | INTRAVENOUS | Status: AC
  Administered 2018-08-28: 14:00:00 500 mL via INTRAVENOUS

## 2018-08-28 MED ORDER — ONDANSETRON HCL 4 MG/2ML IJ SOLN
4.0000 mg | Freq: Once | INTRAMUSCULAR | Status: AC
  Administered 2018-08-28: 14:00:00 4 mg via INTRAVENOUS
  Filled 2018-08-28: qty 2

## 2018-08-28 MED ORDER — SODIUM CHLORIDE 0.9 % IV SOLN
1.0000 g | Freq: Once | INTRAVENOUS | Status: AC
  Administered 2018-08-28: 1 g via INTRAVENOUS
  Filled 2018-08-28: qty 10

## 2018-08-28 NOTE — ED Triage Notes (Signed)
Pt c/o nausea and vomiting x 2 days.  

## 2018-08-28 NOTE — ED Notes (Addendum)
Called pts daughter, Genella Mech, at home and mobile number under demo tab. Left message at both.

## 2018-08-28 NOTE — ED Provider Notes (Signed)
[facility] Emergency Department Provider Note       Time seen: ----------------------------------------- 12:49 PM on 08/28/2018 -----------------------------------------   I have reviewed the triage vital signs and the nursing notes.  HISTORY   Chief Complaint No chief complaint on file.   HPI Lauren Roman is a 85 y.o. female with a history of arthritis, dementia, headaches, hepatitis, hyperlipidemia, myasthenia gravis who presents to the ED for vomiting.  Patient has had vomiting for the past 2 days, unable to keep anything down.  She denies any diarrhea or pain.  Past Medical History:  Diagnosis Date  . Arthritis   . Dementia (HCC)    mild  . Headache    migraines  . Hepatitis 1952   hep b-no problems since then  . Hyperlipidemia   . Myasthenia gravis (HCC)   . Tremor    right hand    Patient Active Problem List   Diagnosis Date Noted  . Lumbar radiculopathy 01/30/2018    Past Surgical History:  Procedure Laterality Date  . COLONOSCOPY    . LUMBAR LAMINECTOMY/DECOMPRESSION MICRODISCECTOMY N/A 01/30/2018   Procedure: LUMBAR LAMINECTOMY/DECOMPRESSION MICRODISCECTOMY 1 LEVEL-L4-5 FAR LATERAL DISCECTOMY;  Surgeon: Venetia NightYarbrough, [place], MD;  Location: [facility] ORS;  Service: Neurosurgery;  Laterality: N/A;    Allergies Exelon [rivastigmine tartrate] and Other  Social History Social History   Tobacco Use  . Smoking status: Former Smoker    Types: Cigarettes    Last attempt to quit: 01/30/1963    Years since quitting: 55.6  . Smokeless tobacco: Never Used  . Tobacco comment: only social smoking and not even a full year  Substance Use Topics  . Alcohol use: Never    Frequency: Never  . Drug use: Never    Review of Systems Constitutional: Negative for fever. Cardiovascular: Negative for chest pain. Respiratory: Negative for shortness of breath. Gastrointestinal: Positive for abdominal pain and vomiting Musculoskeletal: Negative for  back pain. Skin: Negative for rash. Neurological: Negative for headaches, focal weakness or numbness.  All systems negative/normal/unremarkable except as stated in the HPI  ____________________________________________   PHYSICAL EXAM:  VITAL SIGNS: ED Triage Vitals  Enc Vitals Group     BP      Pulse      Resp      Temp      Temp src      SpO2      Weight      Height      Head Circumference      Peak Flow      Pain Score      Pain Loc      Pain Edu?      Excl. in GC?    Constitutional: Alert and oriented.  Mild distress Eyes: Conjunctivae are normal. Normal extraocular movements. Cardiovascular: Normal rate, regular rhythm. No murmurs, rubs, or gallops. Respiratory: Normal respiratory effort without tachypnea nor retractions. Breath sounds are clear and equal bilaterally. No wheezes/rales/rhonchi. Gastrointestinal: Soft and nontender. Normal bowel sounds Musculoskeletal: Nontender with normal range of motion in extremities. No lower extremity tenderness nor edema. Neurologic:  Normal speech and language. No gross focal neurologic deficits are appreciated.  Tremor is noted Skin:  Skin is warm, dry and intact. No rash noted. Psychiatric: Mood and affect are normal. Speech and behavior are normal.  ____________________________________________  ED COURSE:  As part of my medical decision making, I reviewed the following data within the electronic MEDICAL RECORD NUMBER History obtained from family if available, nursing notes, old  chart and ekg, as well as notes from prior ED visits. Patient presented for vomiting, we will assess with labs and imaging as indicated at this time.  EKG: Interpreted by me, sinus rhythm the rate of 92 bpm, extensive baseline artifact, left ward axis, normal QT   Procedures  Lauren Roman was evaluated in Emergency Department on 08/28/2018 for the symptoms described in the history of present illness. She was evaluated in the context of the global  COVID-19 pandemic, which necessitated consideration that the patient might be at risk for infection with the SARS-CoV-2 virus that causes COVID-19. Institutional protocols and algorithms that pertain to the evaluation of patients at risk for COVID-19 are in a state of rapid change based on information released by regulatory bodies including the CDC and federal and state organizations. These policies and algorithms were followed during the patient's care in the ED.  ____________________________________________   LABS (pertinent positives/negatives)  Labs Reviewed  CBC WITH DIFFERENTIAL/PLATELET  COMPREHENSIVE METABOLIC PANEL  LIPASE, BLOOD  URINALYSIS, COMPLETE (UACMP) WITH MICROSCOPIC  TROPONIN I    RADIOLOGY Images were viewed by me  Abdomen 2 view IMPRESSION: Nonobstructive bowel gas pattern.  Geometric calcifications in the bilateral abdomen, potentially nephrolithiasis or on the right, cholelithiasis.  Atherosclerosis ____________________________________________   DIFFERENTIAL DIAGNOSIS   Dehydration, electrolyte abnormality, gastroenteritis, gastritis, sepsis  FINAL ASSESSMENT AND PLAN  Vomiting, cystitis   Plan: The patient had presented for vomiting for the past 2 days. Patient's labs did not reveal any abnormality for CBC and chemistries, she does have a UTI however. Patient's imaging was reassuring.  She was given IV fluids as well as IV antiemetics and Rocephin for her urine.  She is cleared for outpatient follow-up.   Ulice Dash, MD    Note: This note was generated in part or whole with voice recognition software. Voice recognition is usually quite accurate but there are transcription errors that can and very often do occur. I apologize for any typographical errors that were not detected and corrected.     Emily Filbert, MD 08/28/18 [phone]

## 2018-08-30 LAB — URINE CULTURE
Culture: >=100000 — AB
Special Requests: NORMAL

## 2018-09-19 ENCOUNTER — Emergency Department: Payer: Medicare Other

## 2018-09-19 ENCOUNTER — Encounter: Payer: Self-pay | Admitting: Emergency Medicine

## 2018-09-19 ENCOUNTER — Inpatient Hospital Stay
Admission: EM | Admit: 2018-09-19 | Discharge: 2018-09-23 | DRG: 480 | Disposition: A | Discharge status: Skilled Nursing Facility | Payer: Medicare Other | Attending: Internal Medicine | Admitting: Internal Medicine

## 2018-09-19 ENCOUNTER — Other Ambulatory Visit: Payer: Self-pay

## 2018-09-19 DIAGNOSIS — Y92009 Unspecified place in unspecified non-institutional (private) residence as the place of occurrence of the external cause: Secondary | ICD-10-CM | POA: Diagnosis not present

## 2018-09-19 DIAGNOSIS — E785 Hyperlipidemia, unspecified: Secondary | ICD-10-CM | POA: Diagnosis present

## 2018-09-19 DIAGNOSIS — Z888 Allergy status to other drugs, medicaments and biological substances status: Secondary | ICD-10-CM

## 2018-09-19 DIAGNOSIS — Z79899 Other long term (current) drug therapy: Secondary | ICD-10-CM | POA: Diagnosis not present

## 2018-09-19 DIAGNOSIS — M25552 Pain in left hip: Secondary | ICD-10-CM | POA: Diagnosis present

## 2018-09-19 DIAGNOSIS — Z1159 Encounter for screening for other viral diseases: Secondary | ICD-10-CM | POA: Diagnosis not present

## 2018-09-19 DIAGNOSIS — M549 Dorsalgia, unspecified: Secondary | ICD-10-CM | POA: Diagnosis present

## 2018-09-19 DIAGNOSIS — G7000 Myasthenia gravis without (acute) exacerbation: Secondary | ICD-10-CM | POA: Diagnosis present

## 2018-09-19 DIAGNOSIS — M62838 Other muscle spasm: Secondary | ICD-10-CM | POA: Diagnosis present

## 2018-09-19 DIAGNOSIS — S72142A Displaced intertrochanteric fracture of left femur, initial encounter for closed fracture: Principal | ICD-10-CM | POA: Diagnosis present

## 2018-09-19 DIAGNOSIS — J9811 Atelectasis: Secondary | ICD-10-CM | POA: Diagnosis not present

## 2018-09-19 DIAGNOSIS — R251 Tremor, unspecified: Secondary | ICD-10-CM | POA: Diagnosis present

## 2018-09-19 DIAGNOSIS — M1990 Unspecified osteoarthritis, unspecified site: Secondary | ICD-10-CM | POA: Diagnosis present

## 2018-09-19 DIAGNOSIS — Z87891 Personal history of nicotine dependence: Secondary | ICD-10-CM | POA: Diagnosis not present

## 2018-09-19 DIAGNOSIS — J9601 Acute respiratory failure with hypoxia: Secondary | ICD-10-CM | POA: Diagnosis not present

## 2018-09-19 DIAGNOSIS — G8929 Other chronic pain: Secondary | ICD-10-CM | POA: Diagnosis present

## 2018-09-19 DIAGNOSIS — Y9301 Activity, walking, marching and hiking: Secondary | ICD-10-CM | POA: Diagnosis present

## 2018-09-19 DIAGNOSIS — W010XXA Fall on same level from slipping, tripping and stumbling without subsequent striking against object, initial encounter: Secondary | ICD-10-CM | POA: Diagnosis present

## 2018-09-19 DIAGNOSIS — Z791 Long term (current) use of non-steroidal anti-inflammatories (NSAID): Secondary | ICD-10-CM

## 2018-09-19 DIAGNOSIS — D72829 Elevated white blood cell count, unspecified: Secondary | ICD-10-CM | POA: Diagnosis not present

## 2018-09-19 DIAGNOSIS — Z9109 Other allergy status, other than to drugs and biological substances: Secondary | ICD-10-CM | POA: Diagnosis not present

## 2018-09-19 DIAGNOSIS — F0390 Unspecified dementia without behavioral disturbance: Secondary | ICD-10-CM | POA: Diagnosis present

## 2018-09-19 DIAGNOSIS — R5082 Postprocedural fever: Secondary | ICD-10-CM

## 2018-09-19 DIAGNOSIS — G43909 Migraine, unspecified, not intractable, without status migrainosus: Secondary | ICD-10-CM | POA: Diagnosis present

## 2018-09-19 DIAGNOSIS — S72002A Fracture of unspecified part of neck of left femur, initial encounter for closed fracture: Secondary | ICD-10-CM | POA: Diagnosis present

## 2018-09-19 DIAGNOSIS — Z419 Encounter for procedure for purposes other than remedying health state, unspecified: Secondary | ICD-10-CM

## 2018-09-19 LAB — CBC WITH DIFFERENTIAL/PLATELET
Abs Immature Granulocytes: 0.23 10*3/uL — ABNORMAL HIGH (ref 0.00–0.07)
Basophils Absolute: 0.1 10*3/uL (ref 0.0–0.1)
Basophils Relative: 1 %
Eosinophils Absolute: 0.1 10*3/uL (ref 0.0–0.5)
Eosinophils Relative: 1 %
HCT: 38.2 % (ref 36.0–46.0)
Hemoglobin: 12.8 g/dL (ref 12.0–15.0)
Immature Granulocytes: 2 %
Lymphocytes Relative: 7 %
Lymphs Abs: 1 10*3/uL (ref 0.7–4.0)
MCH: 31 pg (ref 26.0–34.0)
MCHC: 33.5 g/dL (ref 30.0–36.0)
MCV: 92.5 fL (ref 80.0–100.0)
Monocytes Absolute: 0.8 10*3/uL (ref 0.1–1.0)
Monocytes Relative: 6 %
Neutro Abs: 11.7 10*3/uL — ABNORMAL HIGH (ref 1.7–7.7)
Neutrophils Relative %: 83 %
Platelets: 279 10*3/uL (ref 150–400)
RBC: 4.13 MIL/uL (ref 3.87–5.11)
RDW: 13.1 % (ref 11.5–15.5)
WBC: 13.8 10*3/uL — ABNORMAL HIGH (ref 4.0–10.5)
nRBC: 0 % (ref 0.0–0.2)

## 2018-09-19 LAB — BASIC METABOLIC PANEL
Anion gap: 9 (ref 5–15)
BUN: 20 mg/dL (ref 8–23)
CO2: 24 mmol/L (ref 22–32)
Calcium: 8.9 mg/dL (ref 8.9–10.3)
Chloride: 107 mmol/L (ref 98–111)
Creatinine, Ser: 0.58 mg/dL (ref 0.44–1.00)
GFR calc Af Amer: >60 mL/min (ref >60)
GFR calc non Af Amer: >60 mL/min (ref >60)
Glucose, Bld: 105 mg/dL — ABNORMAL HIGH (ref 70–99)
Potassium: 3.8 mmol/L (ref 3.5–5.1)
Sodium: 140 mmol/L (ref 135–145)

## 2018-09-19 LAB — PROTIME-INR
INR: 1 (ref 0.8–1.2)
Prothrombin Time: 12.7 seconds (ref 11.4–15.2)

## 2018-09-19 LAB — TYPE AND SCREEN
ABO/RH(D): O POS
Antibody Screen: NEGATIVE

## 2018-09-19 LAB — SURGICAL PCR SCREEN
MRSA, PCR: NEGATIVE
Staphylococcus aureus: NEGATIVE

## 2018-09-19 LAB — SARS CORONAVIRUS 2 BY RT PCR (HOSPITAL ORDER, PERFORMED IN ~~LOC~~ HOSPITAL LAB): SARS Coronavirus 2: NEGATIVE

## 2018-09-19 MED ORDER — ACETAMINOPHEN 650 MG RE SUPP: 650.0000 mg | Freq: Four times a day (QID) | RECTAL | Status: DC | PRN

## 2018-09-19 MED ORDER — ONDANSETRON HCL 4 MG/2ML IJ SOLN
4.0000 mg | Freq: Four times a day (QID) | INTRAMUSCULAR | Status: DC | PRN
  Administered 2018-09-19: 4 mg via INTRAVENOUS
  Filled 2018-09-19: qty 2

## 2018-09-19 MED ORDER — METHOCARBAMOL 500 MG PO TABS
500.0000 mg | ORAL_TABLET | Freq: Four times a day (QID) | ORAL | Status: DC | PRN
  Filled 2018-09-19: qty 1

## 2018-09-19 MED ORDER — TRAMADOL HCL 50 MG PO TABS
50.0000 mg | ORAL_TABLET | ORAL | Status: DC | PRN
  Administered 2018-09-19 – 2018-09-20 (×2): 50 mg via ORAL
  Filled 2018-09-19 (×2): qty 1

## 2018-09-19 MED ORDER — MORPHINE SULFATE (PF) 2 MG/ML IV SOLN
2.0000 mg | INTRAVENOUS | Status: DC | PRN
  Administered 2018-09-19: 2 mg via INTRAVENOUS
  Filled 2018-09-19 (×2): qty 1

## 2018-09-19 MED ORDER — POLYETHYLENE GLYCOL 3350 17 G PO PACK: 17.0000 g | PACK | Freq: Every day | ORAL | Status: DC | PRN

## 2018-09-19 MED ORDER — ONDANSETRON HCL 4 MG PO TABS: 4.0000 mg | ORAL_TABLET | Freq: Four times a day (QID) | ORAL | Status: DC | PRN

## 2018-09-19 MED ORDER — GABAPENTIN 300 MG PO CAPS
300.0000 mg | ORAL_CAPSULE | Freq: Three times a day (TID) | ORAL | Status: DC
  Administered 2018-09-19 – 2018-09-23 (×11): 300 mg via ORAL
  Filled 2018-09-19 (×11): qty 1

## 2018-09-19 MED ORDER — CEFAZOLIN SODIUM-DEXTROSE 1-4 GM/50ML-% IV SOLN
1.0000 g | Freq: Once | INTRAVENOUS | Status: AC
  Administered 2018-09-20: 1 g via INTRAVENOUS
  Filled 2018-09-19: qty 50

## 2018-09-19 MED ORDER — MORPHINE SULFATE (PF) 4 MG/ML IV SOLN
4.0000 mg | Freq: Once | INTRAVENOUS | Status: AC
  Administered 2018-09-19: 4 mg via INTRAVENOUS
  Filled 2018-09-19: qty 1

## 2018-09-19 MED ORDER — ACETAMINOPHEN 325 MG PO TABS: 650.0000 mg | ORAL_TABLET | Freq: Four times a day (QID) | ORAL | Status: DC | PRN

## 2018-09-19 MED ORDER — FENTANYL CITRATE (PF) 100 MCG/2ML IJ SOLN
50.0000 ug | Freq: Once | INTRAMUSCULAR | Status: AC
  Administered 2018-09-19: 50 ug via INTRAVENOUS
  Filled 2018-09-19: qty 2

## 2018-09-19 NOTE — Progress Notes (Signed)
Called daughter per pt request advised of room contact number. Daughter said pt is AX4 with no POA.

## 2018-09-19 NOTE — Progress Notes (Signed)
Family Meeting Note  Advance Directive:yes  Today a meeting took place with the Patient.  Patient is able to participate.  The following clinical team members were present during this meeting:MD  The following were discussed:Patient's diagnosis: left hip fracture, Patient's progosis: Unable to determine and Goals for treatment: Full Code  Additional follow-up to be provided: prn  Time spent during discussion:20 minutes  Lauren Sinclair, MD

## 2018-09-19 NOTE — Consult Note (Signed)
Reason for consult is left intertrochanteric hip fracture  History: Patient is a 85 year old who suffers with myasthenia gravis but does walk around the house unaided although at times she has used a walker partly because of prior back surgery and problems and partly from her myasthenia gravis.  She sometimes leaves the home but not a great deal.  She denies any prodromal symptoms.  She turned in her home and fell to the ground and was brought to the emergency room and found to have the hip fracture  On examination her leg is shortened and externally rotated with severe pain.  Skin is intact there is visible muscle spasm of the quadriceps.  She has a palpable dorsalis pedis and posterior tib pulse is able to flex extend the toes.  Radiographs show unstable left intertrochanteric hip fracture with minimal degenerative arthritis  Clinical impression is left intertrochanteric hip fracture  Plan is for ORIF with IM device.  I did have her daughter come over to my office and discussed the procedure with her today and the potential need for rehab but also possibility of her getting home with home health if she does not have too much confusion postoperatively.

## 2018-09-19 NOTE — ED Notes (Signed)
This RN spoke with pt's POC, Genella Mech (daughter) to provide pt's status update. This RN notified Dr Scotty Court to call family to provide an update on pt.

## 2018-09-19 NOTE — Plan of Care (Signed)

## 2018-09-19 NOTE — H&P (Addendum)
[facility] [place] at [facility]   PATIENT NAME: Lauren Roman    MR#:  [id number]  DATE OF BIRTH:  May 27, 1928  DATE OF ADMISSION:  09/19/2018  PRIMARY CARE PHYSICIAN: Danella Penton, MD   REQUESTING/REFERRING PHYSICIAN: Sharman Cheek, MD  CHIEF COMPLAINT:   Chief Complaint  Patient presents with  . Fall    HISTORY OF PRESENT ILLNESS:  Lauren Roman  is a 85 y.o. female with a known history of mild dementia, hyperlipidemia, myasthenia gravis, arthritis who presented to the ED with left hip pain s/p mechanical fall at home today.  She states she was turning around when her "feet got tripped up".  She fell and landed on her left hip.  She felt immediate left groin and lateral hip pain.  The pain radiated down her left leg.  She denies any loss of consciousness or head trauma.  She denies any numbness or tingling in her leg.  No chest pain or shortness of breath.  In the ED, vitals were unremarkable.  Labs are significant for WBC 13.8.  Left hip x-ray with acute displaced intertrochanteric fracture of the left hip.  Hospitalists were called for admission.  PAST MEDICAL HISTORY:   Past Medical History:  Diagnosis Date  . Arthritis   . Dementia (HCC)    mild  . Headache    migraines  . Hepatitis 1952   hep b-no problems since then  . Hyperlipidemia   . Myasthenia gravis (HCC)   . Tremor    right hand    PAST SURGICAL HISTORY:   Past Surgical History:  Procedure Laterality Date  . COLONOSCOPY    . LUMBAR LAMINECTOMY/DECOMPRESSION MICRODISCECTOMY N/A 01/30/2018   Procedure: LUMBAR LAMINECTOMY/DECOMPRESSION MICRODISCECTOMY 1 LEVEL-L4-5 FAR LATERAL DISCECTOMY;  Surgeon: Venetia Night, MD;  Location: [facility] ORS;  Service: Neurosurgery;  Laterality: N/A;    SOCIAL HISTORY:   Social History   Tobacco Use  . Smoking status: Former Smoker    Types: Cigarettes    Last attempt to quit: 01/30/1963    Years since quitting: 55.6  . Smokeless tobacco:  Never Used  . Tobacco comment: only social smoking and not even a full year  Substance Use Topics  . Alcohol use: Never    Frequency: Never    FAMILY HISTORY:  Father-heart attack  DRUG ALLERGIES:   Allergies  Allergen Reactions  . Exelon [Rivastigmine Tartrate]   . Other Itching and Rash    Anything metal on skin will cause rash and itching    REVIEW OF SYSTEMS:   Review of Systems  Constitutional: Negative for chills and fever.  HENT: Negative for congestion and sore throat.   Eyes: Negative for blurred vision and double vision.  Respiratory: Negative for cough and shortness of breath.   Cardiovascular: Negative for chest pain and palpitations.  Gastrointestinal: Negative for nausea and vomiting.  Genitourinary: Negative for dysuria and urgency.  Musculoskeletal: Positive for falls and joint pain.  Neurological: Negative for dizziness, loss of consciousness and headaches.  Psychiatric/Behavioral: Negative for depression. The patient is not nervous/anxious.     MEDICATIONS AT HOME:   Prior to Admission medications   Medication Sig Start Date End Date Taking? Authorizing Provider  Cholecalciferol (VITAMIN D-1000 MAX ST) 1000 units tablet Take 1,000 Units by mouth daily.    [provider]  gabapentin (NEURONTIN) 300 MG capsule Take 300 mg by mouth 3 (three) times daily.    [provider]  meloxicam (MOBIC) 7.5 MG tablet  Take 7.5 mg by mouth daily. 01/26/18   [provider]  methocarbamol (ROBAXIN) 500 MG tablet Take 1 tablet (500 mg total) by mouth every 6 (six) hours as needed for muscle spasms. 01/31/18   Venetia NightYarbrough, Chester, MD  ondansetron (ZOFRAN ODT) 4 MG disintegrating tablet Take 1 tablet (4 mg total) by mouth every 8 (eight) hours as needed for nausea or vomiting. 08/28/18   Emily FilbertWilliams, Jonathan E, MD  promethazine (PHENERGAN) 25 MG tablet Take 25 mg by mouth every 6 (six) hours as needed for nausea or vomiting.    [provider]   traMADol (ULTRAM) 50 MG tablet Take 1 tablet (50 mg total) by mouth every 4 (four) hours as needed for moderate pain. 01/31/18   Venetia NightYarbrough, Chester, MD  vitamin B-12 (CYANOCOBALAMIN) 1000 MCG tablet Take 1,000 mcg by mouth daily.    [provider]      VITAL SIGNS:  Blood pressure (!) 156/61, pulse 67, temperature 97.8 F (36.6 C), temperature source Oral, resp. rate 18, height 5' (1.524 m), weight 53.8 kg, SpO2 98 %.  PHYSICAL EXAMINATION:  Physical Exam  GENERAL:  85 y.o.-year-old patient lying in the bed with no acute distress.  EYES: Pupils equal, round, reactive to light and accommodation. No scleral icterus. Extraocular muscles intact.  HEENT: Head atraumatic, normocephalic. Oropharynx and nasopharynx clear.  NECK:  Supple, no jugular venous distention. No thyroid enlargement, no tenderness.  LUNGS: Normal breath sounds bilaterally, no wheezing, rales,rhonchi or crepitation. No use of accessory muscles of respiration.  CARDIOVASCULAR: RRR, S1, S2 normal. No murmurs, rubs, or gallops.  ABDOMEN: Soft, nontender, nondistended. Bowel sounds present. No organomegaly or mass.  EXTREMITIES: No pedal edema, cyanosis, or clubbing. + Left lower extremity is externally rotated and shortened.  No ecchymoses present. NEUROLOGIC: Cranial nerves II through XII are intact.  Unable to assess muscle strength in the left lower extremity secondary to pain.  Muscle strength 5/5 in the remaining extremities.  Sensation intact. Gait not checked.  PSYCHIATRIC: The patient is alert and oriented x 3.  SKIN: No obvious rash, lesion, or ulcer.   LABORATORY PANEL:   CBC Recent Labs  Lab 09/19/18 1323  WBC 13.8*  HGB 12.8  HCT 38.2  PLT 279   ------------------------------------------------------------------------------------------------------------------  Chemistries  Recent Labs  Lab 09/19/18 1323  NA 140  K 3.8  CL 107  CO2 24  GLUCOSE 105*  BUN 20  CREATININE 0.58  CALCIUM 8.9    ------------------------------------------------------------------------------------------------------------------  Cardiac Enzymes No results for input(s): TROPONINI in the last 168 hours. ------------------------------------------------------------------------------------------------------------------  RADIOLOGY:  Dg Chest 1 View  Result Date: 09/19/2018 CLINICAL DATA:  Left hip pain status post fall. Pain status post fall. EXAM: CHEST  1 VIEW COMPARISON:  None. FINDINGS: There is no pneumothorax. No displaced rib fracture. The cardiac silhouette is enlarged. There is no focal consolidation. IMPRESSION: 1. No acute cardiopulmonary process identified. 2. Cardiomegaly. Electronically Signed   By: Katherine Mantlehristopher  Green M.D.   On: 09/19/2018 14:12   Dg Hip Unilat W Or Wo Pelvis 2-3 Views Left  Result Date: 09/19/2018 CLINICAL DATA:  Hip pain EXAM: DG HIP (WITH OR WITHOUT PELVIS) 2-3V LEFT COMPARISON:  None. FINDINGS: There is an acute displaced intratrochanteric fracture of the proximal left femur. There is no dislocation. There is diffuse osteopenia of the visualized osseous structures. There is a large amount of stool in the colon. IMPRESSION: Acute displaced inter trochanteric fracture of the left hip. No dislocation. Diffuse osteopenia is noted. Electronically Signed  By: Katherine Mantle M.D.   On: 09/19/2018 14:12      IMPRESSION AND PLAN:   Left hip fracture- s/p mechanical fall -Ortho consult- plan for surgery tomorrow -Pain control -Hold on anticoagulation until after surgery -N.p.o. at midnight  Leukocytosis- likely stress response. No signs of infection. -Recheck WBC in the morning  Myasthenia gravis- stable, no signs of myasthenia crisis. Not on any meds at home. -Supportive care  All the records are reviewed and case discussed with ED provider. Management plans discussed with the patient, family and they are in agreement.  CODE STATUS: Full  TOTAL TIME TAKING CARE OF  THIS PATIENT: 45 minutes.    Jinny Blossom Ahley Bulls M.D on 09/19/2018 at 2:38 PM  Between 7am to 6pm - Pager - [phone]  After 6pm go to [email or web address] - Social research officer, government  [facility] [place] [facility]  Office  [phone]  CC: Primary care physician; Danella Penton, MD   Note: This dictation was prepared with Dragon dictation along with smaller phrase technology. Any transcriptional errors that result from this process are unintentional.

## 2018-09-19 NOTE — ED Notes (Signed)
This Rn attempted to call report on room 103 w/o success.

## 2018-09-19 NOTE — Anesthesia Preprocedure Evaluation (Addendum)
Anesthesia Evaluation  Patient identified by MRN, date of birth, ID band Patient awake    Reviewed: Allergy & Precautions, NPO status , Patient's Chart, lab work & pertinent test results, reviewed documented beta blocker date and time   Airway Mallampati: II  TM Distance: >3 FB     Dental  (+) Chipped   Pulmonary former smoker,           Cardiovascular      Neuro/Psych  Headaches, PSYCHIATRIC DISORDERS Dementia  Neuromuscular disease    GI/Hepatic (+) Hepatitis -  Endo/Other    Renal/GU      Musculoskeletal  (+) Arthritis ,   Abdominal   Peds  Hematology   Anesthesia Other Findings Past Medical History: No date: Arthritis No date: Dementia (HCC)     Comment:  mild No date: Headache     Comment:  migraines 1952: Hepatitis     Comment:  hep b-no problems since then No date: Hyperlipidemia She has myasthenia gravis. Discussed with DR Rosita Kea and Piscit[place]. Will proceed with spinal if possible. She has had back surgery. EKG from a few yrs ago was normal. No date: Myasthenia gravis (HCC) No date: Tremor     Comment:  right hand   Reproductive/Obstetrics                            Anesthesia Physical Anesthesia Plan  ASA: III  Anesthesia Plan: Spinal   Post-op Pain Management:    Induction:   PONV Risk Score and Plan:   Airway Management Planned:   Additional Equipment:   Intra-op Plan:   Post-operative Plan:   Informed Consent: I have reviewed the patients History and Physical, chart, labs and discussed the procedure including the risks, benefits and alternatives for the proposed anesthesia with the patient or authorized representative who has indicated his/her understanding and acceptance.       Plan Discussed with: CRNA  Anesthesia Plan Comments:         Anesthesia Quick Evaluation

## 2018-09-19 NOTE — ED Provider Notes (Signed)
[facility] Emergency Department Provider Note  ____________________________________________  Time seen: Approximately 2:58 PM  I have reviewed the triage vital signs and the nursing notes.   HISTORY  Chief Complaint Fall    HPI Lauren Roman is a 85 y.o. female with a history of dementia hepatitis B myasthenia gravis hyperlipidemia who was brought to the ED after a mechanical fall at home resulting in left hip pain.  She was walking to the kitchen and when she made a turn she fell over onto her left hip resulting in sudden severe pain.  Nonradiating.  Worse with movement, no alleviating factors.  No head injury or other pain complaints.  Daughter notes that the patient has also had chronic back pain issues for which she sees Dr. Marcell Barlow of neurosurgery.  After an unsuccessful procedure to treat a bulging disc, she is planned for CT-guided spinal injections for pain control. This was canceled due to the pandemic and the need to delay all elective procedures.      Past Medical History:  Diagnosis Date  . Arthritis   . Dementia (HCC)    mild  . Headache    migraines  . Hepatitis 1952   hep b-no problems since then  . Hyperlipidemia   . Myasthenia gravis (HCC)   . Tremor    right hand     Patient Active Problem List   Diagnosis Date Noted  . Lumbar radiculopathy 01/30/2018     Past Surgical History:  Procedure Laterality Date  . COLONOSCOPY    . LUMBAR LAMINECTOMY/DECOMPRESSION MICRODISCECTOMY N/A 01/30/2018   Procedure: LUMBAR LAMINECTOMY/DECOMPRESSION MICRODISCECTOMY 1 LEVEL-L4-5 FAR LATERAL DISCECTOMY;  Surgeon: Venetia Night, MD;  Location: [facility] ORS;  Service: Neurosurgery;  Laterality: N/A;     Prior to Admission medications   Medication Sig Start Date End Date Taking? Authorizing Provider  Cholecalciferol (VITAMIN D-1000 MAX ST) 1000 units tablet Take 1,000 Units by mouth daily.    [provider]  gabapentin  (NEURONTIN) 300 MG capsule Take 300 mg by mouth 3 (three) times daily.    [provider]  meloxicam (MOBIC) 7.5 MG tablet Take 7.5 mg by mouth daily. 01/26/18   [provider]  methocarbamol (ROBAXIN) 500 MG tablet Take 1 tablet (500 mg total) by mouth every 6 (six) hours as needed for muscle spasms. 01/31/18   Venetia Night, MD  ondansetron (ZOFRAN ODT) 4 MG disintegrating tablet Take 1 tablet (4 mg total) by mouth every 8 (eight) hours as needed for nausea or vomiting. 08/28/18   Emily Filbert, MD  promethazine (PHENERGAN) 25 MG tablet Take 25 mg by mouth every 6 (six) hours as needed for nausea or vomiting.    [provider]  traMADol (ULTRAM) 50 MG tablet Take 1 tablet (50 mg total) by mouth every 4 (four) hours as needed for moderate pain. 01/31/18   Venetia Night, MD  vitamin B-12 (CYANOCOBALAMIN) 1000 MCG tablet Take 1,000 mcg by mouth daily.    [provider]     Allergies Exelon [rivastigmine tartrate] and Other   History reviewed. No pertinent family history.  Social History Social History   Tobacco Use  . Smoking status: Former Smoker    Types: Cigarettes    Last attempt to quit: 01/30/1963    Years since quitting: 55.6  . Smokeless tobacco: Never Used  . Tobacco comment: only social smoking and not even a full year  Substance Use Topics  . Alcohol use: Never    Frequency:  Never  . Drug use: Never    Review of Systems  Constitutional:   No fever or chills.  ENT:   No sore throat. No rhinorrhea. Cardiovascular:   No chest pain or syncope. Respiratory:   No dyspnea or cough. Gastrointestinal:   Negative for abdominal pain, vomiting and diarrhea.  Musculoskeletal:   Left hip pain as above All other systems reviewed and are negative except as documented above in ROS and HPI.  ____________________________________________   PHYSICAL EXAM:  VITAL SIGNS: ED Triage Vitals  Enc Vitals Group     BP 09/19/18 1223 (!)  156/61     Pulse Rate 09/19/18 1223 67     Resp 09/19/18 1226 18     Temp 09/19/18 1226 97.8 F (36.6 C)     Temp Source 09/19/18 1226 Oral     SpO2 09/19/18 1223 100 %     Weight 09/19/18 1226 118 lb 9.7 oz (53.8 kg)     Height 09/19/18 1226 5' (1.524 m)     Head Circumference --      Peak Flow --      Pain Score 09/19/18 1226 6     Pain Loc --      Pain Edu? --      Excl. in GC? --     Vital signs reviewed, nursing assessments reviewed.   Constitutional:   Alert and oriented. Non-toxic appearance. Eyes:   Conjunctivae are normal. EOMI. PERRL. ENT      Head:   Normocephalic and atraumatic.      Nose:   No congestion/rhinnorhea.       Mouth/Throat:   MMM, no pharyngeal erythema. No peritonsillar mass.       Neck:   No meningismus. Full ROM. Hematological/Lymphatic/Immunilogical:   No cervical lymphadenopathy. Cardiovascular:   RRR. Symmetric bilateral radial and DP pulses.  No murmurs. Cap refill less than 2 seconds. Respiratory:   Normal respiratory effort without tachypnea/retractions. Breath sounds are clear and equal bilaterally. No wheezes/rales/rhonchi. Gastrointestinal:   Soft and nontender. Non distended. There is no CVA tenderness.  No rebound, rigidity, or guarding.  Musculoskeletal:   Pain with movement of the left leg.  Pain with palpation of the left hip.  There is shortening and external rotation of the left leg.  Intact motor function. Neurologic:   Normal speech and language.  Motor grossly intact. No acute focal neurologic deficits are appreciated.  Skin:    Skin is warm, dry and intact. No rash noted.  No petechiae, purpura, or bullae.  ____________________________________________    LABS (pertinent positives/negatives) (all labs ordered are listed, but only abnormal results are displayed) Labs Reviewed  BASIC METABOLIC PANEL - Abnormal; Notable for the following components:      Result Value   Glucose, Bld 105 (*)    All other components within  normal limits  CBC WITH DIFFERENTIAL/PLATELET - Abnormal; Notable for the following components:   WBC 13.8 (*)    Neutro Abs 11.7 (*)    Abs Immature Granulocytes 0.23 (*)    All other components within normal limits  SARS CORONAVIRUS 2 (HOSPITAL ORDER, PERFORMED IN [facility])  PROTIME-INR  TYPE AND SCREEN   ____________________________________________   EKG  Interpreted by me Normal sinus rhythm rate of 66, left axis, normal intervals.  Poor R wave progression.  Normal ST segments and T waves.  ____________________________________________    RADIOLOGY  Dg Chest 1 View  Result Date: 09/19/2018 CLINICAL DATA:  Left hip pain  status post fall. Pain status post fall. EXAM: CHEST  1 VIEW COMPARISON:  None. FINDINGS: There is no pneumothorax. No displaced rib fracture. The cardiac silhouette is enlarged. There is no focal consolidation. IMPRESSION: 1. No acute cardiopulmonary process identified. 2. Cardiomegaly. Electronically Signed   By: Katherine Mantlehristopher  Green M.D.   On: 09/19/2018 14:12   Dg Hip Unilat W Or Wo Pelvis 2-3 Views Left  Result Date: 09/19/2018 CLINICAL DATA:  Hip pain EXAM: DG HIP (WITH OR WITHOUT PELVIS) 2-3V LEFT COMPARISON:  None. FINDINGS: There is an acute displaced intratrochanteric fracture of the proximal left femur. There is no dislocation. There is diffuse osteopenia of the visualized osseous structures. There is a large amount of stool in the colon. IMPRESSION: Acute displaced inter trochanteric fracture of the left hip. No dislocation. Diffuse osteopenia is noted. Electronically Signed   By: Katherine Mantlehristopher  Green M.D.   On: 09/19/2018 14:12    ____________________________________________   PROCEDURES Procedures  ____________________________________________    CLINICAL IMPRESSION / ASSESSMENT AND PLAN / ED COURSE  Medications ordered in the ED: Medications  fentaNYL (SUBLIMAZE) injection 50 mcg (50 mcg Intravenous Given 09/19/18 1308)  morphine  4 MG/ML injection 4 mg (4 mg Intravenous Given 09/19/18 1438)    Pertinent labs & imaging results that were available during my care of the patient were reviewed by me and considered in my medical decision making (see chart for details).  Lauren Roman was evaluated in Emergency Department on 09/19/2018 for the symptoms described in the history of present illness. She was evaluated in the context of the global COVID-19 pandemic, which necessitated consideration that the patient might be at risk for infection with the SARS-CoV-2 virus that causes COVID-19. Institutional protocols and algorithms that pertain to the evaluation of patients at risk for COVID-19 are in a state of rapid change based on information released by regulatory bodies including the CDC and federal and state organizations. These policies and algorithms were followed during the patient's care in the ED.   Patient presents with left hip pain after a mechanical fall.  Exam highly suspicious for left hip fracture.  X-rays obtained which do show a displaced intertrochanteric fracture.  Other x-rays and labs unremarkable.  Discussed with the daughter to inform her of the findings and the necessity of surgery.  I have left a message for Dr. Rosita KeaMenz in the operating room who is currently scrubbed into a case so that he can evaluate the patient as well.  Case discussed with hospitalist for further preoperative management.      ____________________________________________   FINAL CLINICAL IMPRESSION(S) / ED DIAGNOSES    Final diagnoses:  Closed fracture dislocation of left hip joint, initial encounter (HCC)  Dementia without behavioral disturbance, unspecified dementia type [facility](HCC)     ED Discharge Orders    None      Portions of this note were generated with dragon dictation software. Dictation errors may occur despite best attempts at proofreading.   Sharman CheekStafford, Mirabella Hilario, MD 09/19/18 [id number]

## 2018-09-19 NOTE — Plan of Care (Signed)
Pt alert, forgetful at times. Acknowledges teaching of safety and infection but not sure how much is retained.

## 2018-09-19 NOTE — ED Triage Notes (Addendum)
Pt from home via [facility]. Per EMS, pt's family called due to pt falling, per pt, she walking from her room to the kitchen where she turned and fell, landed on her left hip, pain radiating to left foot. Per EMS pt has a hx of dementia, pt A/Ox3; had back surgery, no LOC; no neck pain. Unwitnessed fall, skin tear on left elbow.

## 2018-09-19 NOTE — ED Notes (Signed)
Pt back room 2 from X-ray

## 2018-09-19 NOTE — ED Notes (Signed)
ED TO INPATIENT HANDOFF REPORT  ED Nurse Name and Phone #: Calypso Hagarty 52  S Name/Age/Gender Lauren Roman 85 y.o. female Room/Bed: ED02A/ED02A  Code Status   Code Status: Prior  Home/SNF/Other Home A/Ox4 at this time  Is this baseline? YES  Triage Complete: Triage complete  Chief Complaint left hip pain  Triage Note Pt from home via [facility]. Per EMS, pt's family called due to pt falling, per pt, she walking from her room to the kitchen where she turned and fell, landed on her left hip, pain radiating to left foot. Per EMS pt has a hx of dementia, pt A/Ox3; had back surgery, no LOC; no neck pain. Unwitnessed fall, skin tear on left elbow.     Allergies Allergies  Allergen Reactions  . Exelon [Rivastigmine Tartrate]   . Other Itching and Rash    Anything metal on skin will cause rash and itching    Level of Care/Admitting Diagnosis ED Disposition    ED Disposition Condition Comment   Admit  Hospital Area: [facility] [[id number]]  Level of Care: Med-Surg [16]  Covid Evaluation: N/A  Diagnosis: Closed left hip fracture [facility]) [[id number]]  Admitting Physician: Willadean Carol DODD [[id number]]  Attending Physician: Willadean Carol DODD [[id number]]  Estimated length of stay: past midnight tomorrow  Certification:: I certify this patient will need inpatient services for at least 2 midnights  PT Class (Do Not Modify): Inpatient [101]  PT Acc Code (Do Not Modify): Private [1]       B Medical/Surgery History Past Medical History:  Diagnosis Date  . Arthritis   . Dementia (HCC)    mild  . Headache    migraines  . Hepatitis 1952   hep b-no problems since then  . Hyperlipidemia   . Myasthenia gravis (HCC)   . Tremor    right hand   Past Surgical History:  Procedure Laterality Date  . COLONOSCOPY    . LUMBAR LAMINECTOMY/DECOMPRESSION MICRODISCECTOMY N/A 01/30/2018   Procedure: LUMBAR LAMINECTOMY/DECOMPRESSION MICRODISCECTOMY 1 LEVEL-L4-5 FAR LATERAL DISCECTOMY;   Surgeon: Venetia Night, MD;  Location: [facility] ORS;  Service: Neurosurgery;  Laterality: N/A;     A IV Location/Drains/Wounds Patient Lines/Drains/Airways Status   Active Line/Drains/Airways    Name:   Placement date:   Placement time:   Site:   Days:   Peripheral IV 08/28/18 Right Forearm   08/28/18    1329    Forearm   22   Peripheral IV 09/19/18 Right Hand   09/19/18    1245    Hand   less than 1   Urethral Catheter Florestine Carmical rn  Non-latex 14 Fr.   09/19/18    1456    Non-latex   less than 1   Incision (Closed) 01/30/18 Back   01/30/18    1611     232          Intake/Output Last 24 hours  Intake/Output Summary (Last 24 hours) at 09/19/2018 1718 Last data filed at 09/19/2018 1457 Gross per 24 hour  Intake -  Output 80 ml  Net -80 ml    Labs/Imaging Results for orders placed or performed during the hospital encounter of 09/19/18 (from the past 48 hour(s))  Basic metabolic panel     Status: Abnormal   Collection Time: 09/19/18  1:23 PM  Result Value Ref Range   Sodium 140 135 - 145 mmol/L   Potassium 3.8 3.5 - 5.1 mmol/L   Chloride 107 98 - 111 mmol/L   CO2  24 22 - 32 mmol/L   Glucose, Bld 105 (H) 70 - 99 mg/dL   BUN 20 8 - 23 mg/dL   Creatinine, Ser 1.610.58 0.44 - 1.00 mg/dL   Calcium 8.9 8.9 - 09.610.3 mg/dL   GFR calc non Af Amer >60 >60 mL/min   GFR calc Af Amer >60 >60 mL/min   Anion gap 9 5 - 15    Comment: Performed at [facility], [address] [place], [place] [address]  CBC with Differential     Status: Abnormal   Collection Time: 09/19/18  1:23 PM  Result Value Ref Range   WBC 13.8 (H) 4.0 - 10.5 K/uL   RBC 4.13 3.87 - 5.11 MIL/uL   Hemoglobin 12.8 12.0 - 15.0 g/dL   HCT 98.138.2 19.136.0 - 47.846.0 %   MCV 92.5 80.0 - 100.0 fL   MCH 31.0 26.0 - 34.0 pg   MCHC 33.5 30.0 - 36.0 g/dL   RDW 29.513.1 62.111.5 - 30.815.5 %   Platelets 279 150 - 400 K/uL   nRBC 0.0 0.0 - 0.2 %   Neutrophils Relative % 83 %   Neutro Abs 11.7 (H) 1.7 - 7.7 K/uL   Lymphocytes Relative 7 %    Lymphs Abs 1.0 0.7 - 4.0 K/uL   Monocytes Relative 6 %   Monocytes Absolute 0.8 0.1 - 1.0 K/uL   Eosinophils Relative 1 %   Eosinophils Absolute 0.1 0.0 - 0.5 K/uL   Basophils Relative 1 %   Basophils Absolute 0.1 0.0 - 0.1 K/uL   Immature Granulocytes 2 %   Abs Immature Granulocytes 0.23 (H) 0.00 - 0.07 K/uL    Comment: Performed at [facility], [address] [place], [place] [address]  Protime-INR     Status: None   Collection Time: 09/19/18  1:23 PM  Result Value Ref Range   Prothrombin Time 12.7 11.4 - 15.2 seconds   INR 1.0 0.8 - 1.2    Comment: (NOTE) INR goal varies based on device and disease states. Performed at [facility], [address] [place], [place] [address]   Type and screen [facility]     Status: None   Collection Time: 09/19/18  1:23 PM  Result Value Ref Range   ABO/RH(D) O POS    Antibody Screen NEG    Sample Expiration      09/22/2018,2359 Performed at [facility], [address] [place], [place] [address]   SARS Coronavirus 2 (CEPHEID - Performed in [facility]), Hosp Order     Status: None   Collection Time: 09/19/18  2:50 PM  Result Value Ref Range   SARS Coronavirus 2 NEGATIVE NEGATIVE    Comment: (NOTE) If result is NEGATIVE SARS-CoV-2 target nucleic acids are NOT DETECTED. The SARS-CoV-2 RNA is generally detectable in upper and lower  respiratory specimens during the acute phase of infection. The lowest  concentration of SARS-CoV-2 viral copies this assay can detect is 250  copies / mL. A negative result does not preclude SARS-CoV-2 infection  and should not be used as the sole basis for treatment or other  patient management decisions.  A negative result may occur with  improper specimen collection / handling, submission of specimen other  than nasopharyngeal swab, presence of viral mutation(s) within the  areas targeted by this assay, and inadequate number of viral copies   (<250 copies / mL). A negative result must be combined with clinical  observations, patient history,  and epidemiological information. If result is POSITIVE SARS-CoV-2 target nucleic acids are DETECTED. The SARS-CoV-2 RNA is generally detectable in upper and lower  respiratory specimens dur ing the acute phase of infection.  Positive  results are indicative of active infection with SARS-CoV-2.  Clinical  correlation with patient history and other diagnostic information is  necessary to determine patient infection status.  Positive results do  not rule out bacterial infection or co-infection with other viruses. If result is PRESUMPTIVE POSTIVE SARS-CoV-2 nucleic acids MAY BE PRESENT.   A presumptive positive result was obtained on the submitted specimen  and confirmed on repeat testing.  While 2019 novel coronavirus  (SARS-CoV-2) nucleic acids may be present in the submitted sample  additional confirmatory testing may be necessary for epidemiological  and / or clinical management purposes  to differentiate between  SARS-CoV-2 and other Sarbecovirus currently known to infect humans.  If clinically indicated additional testing with an alternate test  methodology [phone]) is advised. The SARS-CoV-2 RNA is generally  detectable in upper and lower respiratory sp ecimens during the acute  phase of infection. The expected result is Negative. Fact Sheet for Patients:  BoilerBrush.com.cy Fact Sheet for Healthcare Providers: https://pope.com/ This test is not yet approved or cleared by the [place] FDA and has been authorized for detection and/or diagnosis of SARS-CoV-2 by FDA under an Emergency Use Authorization (EUA).  This EUA will remain in effect (meaning this test can be used) for the duration of the COVID-19 declaration under Section 564(b)(1) of the Act, 21 U.S.C. section 360bbb-3(b)(1), unless the authorization is terminated  or revoked sooner. Performed at [facility], [address], [place], [place] [address]    Dg Chest 1 View  Result Date: 09/19/2018 CLINICAL DATA:  Left hip pain status post fall. Pain status post fall. EXAM: CHEST  1 VIEW COMPARISON:  None. FINDINGS: There is no pneumothorax. No displaced rib fracture. The cardiac silhouette is enlarged. There is no focal consolidation. IMPRESSION: 1. No acute cardiopulmonary process identified. 2. Cardiomegaly. Electronically Signed   By: Katherine Mantle M.D.   On: 09/19/2018 14:12   Dg Hip Unilat W Or Wo Pelvis 2-3 Views Left  Result Date: 09/19/2018 CLINICAL DATA:  Hip pain EXAM: DG HIP (WITH OR WITHOUT PELVIS) 2-3V LEFT COMPARISON:  None. FINDINGS: There is an acute displaced intratrochanteric fracture of the proximal left femur. There is no dislocation. There is diffuse osteopenia of the visualized osseous structures. There is a large amount of stool in the colon. IMPRESSION: Acute displaced inter trochanteric fracture of the left hip. No dislocation. Diffuse osteopenia is noted. Electronically Signed   By: Katherine Mantle M.D.   On: 09/19/2018 14:12    Pending Labs [employer] (From admission, onward)    Start     Ordered   Signed and Armed forces training and education officer morning,   R     Signed and Held   Signed and Held  CBC  Tomorrow morning,   R     Signed and Held          Vitals/Pain Today's Vitals   09/19/18 1226 09/19/18 1249 09/19/18 1437 09/19/18 1503  BP: (!) 156/61  (!) 160/63   Pulse: 67  69   Resp: 18  16   Temp: 97.8 F (36.6 C)     TempSrc: Oral     SpO2: 98%  99%   Weight: 53.8 kg     Height: 5' (1.524 m)  PainSc: Isolation Precautions No active isolations  Medications Medications  ceFAZolin (ANCEF) IVPB 1 g/50 mL premix (has no administration in time range)  ondansetron (ZOFRAN) tablet 4 mg (has no administration in time range)    Or  ondansetron (ZOFRAN) injection 4 mg (has  no administration in time range)  morphine 2 MG/ML injection 2 mg (has no administration in time range)  fentaNYL (SUBLIMAZE) injection 50 mcg (50 mcg Intravenous Given 09/19/18 1308)  morphine 4 MG/ML injection 4 mg (4 mg Intravenous Given 09/19/18 1438)    Mobility Per pt, she walks with a walker at home  High fall risk   Focused Assessments    R Recommendations: See Admitting Provider Note  Report given to:   Additional Notes:

## 2018-09-20 ENCOUNTER — Inpatient Hospital Stay: Payer: Medicare Other | Admitting: Anesthesiology

## 2018-09-20 ENCOUNTER — Inpatient Hospital Stay: Payer: Medicare Other

## 2018-09-20 ENCOUNTER — Encounter
Admission: EM | Disposition: A | Discharge status: Skilled Nursing Facility | Payer: Self-pay | Source: Home / Self Care | Attending: Internal Medicine

## 2018-09-20 ENCOUNTER — Other Ambulatory Visit: Payer: Self-pay

## 2018-09-20 HISTORY — PX: INTRAMEDULLARY (IM) NAIL INTERTROCHANTERIC: SHX5875

## 2018-09-20 LAB — CBC
HCT: 35.5 % — ABNORMAL LOW (ref 36.0–46.0)
Hemoglobin: 11.7 g/dL — ABNORMAL LOW (ref 12.0–15.0)
MCH: 30.3 pg (ref 26.0–34.0)
MCHC: 33 g/dL (ref 30.0–36.0)
MCV: 92 fL (ref 80.0–100.0)
Platelets: 255 10*3/uL (ref 150–400)
RBC: 3.86 MIL/uL — ABNORMAL LOW (ref 3.87–5.11)
RDW: 13.2 % (ref 11.5–15.5)
WBC: 11.5 10*3/uL — ABNORMAL HIGH (ref 4.0–10.5)
nRBC: 0 % (ref 0.0–0.2)

## 2018-09-20 LAB — BASIC METABOLIC PANEL
Anion gap: 9 (ref 5–15)
BUN: 15 mg/dL (ref 8–23)
CO2: 24 mmol/L (ref 22–32)
Calcium: 8.7 mg/dL — ABNORMAL LOW (ref 8.9–10.3)
Chloride: 104 mmol/L (ref 98–111)
Creatinine, Ser: 0.53 mg/dL (ref 0.44–1.00)
GFR calc Af Amer: >60 mL/min (ref >60)
GFR calc non Af Amer: >60 mL/min (ref >60)
Glucose, Bld: 86 mg/dL (ref 70–99)
Potassium: 4.1 mmol/L (ref 3.5–5.1)
Sodium: 137 mmol/L (ref 135–145)

## 2018-09-20 SURGERY — FIXATION, FRACTURE, INTERTROCHANTERIC, WITH INTRAMEDULLARY ROD
Anesthesia: Spinal | Laterality: Left

## 2018-09-20 MED ORDER — PHENYLEPHRINE HCL (PRESSORS) 10 MG/ML IV SOLN
INTRAVENOUS | Status: DC | PRN
  Administered 2018-09-20 (×5): 50 ug via INTRAVENOUS

## 2018-09-20 MED ORDER — MAGNESIUM CITRATE PO SOLN: 1.0000 | Freq: Once | ORAL | Status: DC | PRN

## 2018-09-20 MED ORDER — ENOXAPARIN SODIUM 40 MG/0.4ML ~~LOC~~ SOLN
40.0000 mg | SUBCUTANEOUS | Status: DC
  Administered 2018-09-20 – 2018-09-22 (×3): 40 mg via SUBCUTANEOUS
  Filled 2018-09-20 (×3): qty 0.4

## 2018-09-20 MED ORDER — ONDANSETRON HCL 4 MG/2ML IJ SOLN: 4.0000 mg | Freq: Once | INTRAMUSCULAR | Status: DC | PRN

## 2018-09-20 MED ORDER — FENTANYL CITRATE (PF) 100 MCG/2ML IJ SOLN: 25.0000 ug | INTRAMUSCULAR | Status: DC | PRN

## 2018-09-20 MED ORDER — PHENOL 1.4 % MT LIQD: 1.0000 | OROMUCOSAL | Status: DC | PRN

## 2018-09-20 MED ORDER — METOCLOPRAMIDE HCL 5 MG PO TABS: 5.0000 mg | ORAL_TABLET | Freq: Three times a day (TID) | ORAL | Status: DC | PRN

## 2018-09-20 MED ORDER — CALCIUM CARBONATE ANTACID 500 MG PO CHEW
200.0000 mg | CHEWABLE_TABLET | Freq: Every day | ORAL | Status: DC
  Administered 2018-09-21 – 2018-09-23 (×3): 200 mg via ORAL
  Filled 2018-09-20 (×3): qty 1

## 2018-09-20 MED ORDER — METOCLOPRAMIDE HCL 5 MG/ML IJ SOLN: 5.0000 mg | Freq: Three times a day (TID) | INTRAMUSCULAR | Status: DC | PRN

## 2018-09-20 MED ORDER — CEFAZOLIN SODIUM-DEXTROSE 1-4 GM/50ML-% IV SOLN
INTRAVENOUS | Status: AC
  Filled 2018-09-20: qty 50

## 2018-09-20 MED ORDER — PROPOFOL 500 MG/50ML IV EMUL
INTRAVENOUS | Status: DC | PRN
  Administered 2018-09-20: 30 ug/kg/min via INTRAVENOUS

## 2018-09-20 MED ORDER — KETAMINE HCL 50 MG/ML IJ SOLN
INTRAMUSCULAR | Status: DC | PRN
  Administered 2018-09-20: 25 mg via INTRAMUSCULAR

## 2018-09-20 MED ORDER — CEFAZOLIN SODIUM-DEXTROSE 1-4 GM/50ML-% IV SOLN
1.0000 g | Freq: Four times a day (QID) | INTRAVENOUS | Status: AC
  Administered 2018-09-20 – 2018-09-21 (×3): 1 g via INTRAVENOUS
  Filled 2018-09-20 (×3): qty 50

## 2018-09-20 MED ORDER — ACETAMINOPHEN 325 MG PO TABS: 325.0000 mg | ORAL_TABLET | Freq: Four times a day (QID) | ORAL | Status: DC | PRN

## 2018-09-20 MED ORDER — PANTOPRAZOLE SODIUM 40 MG PO TBEC
40.0000 mg | DELAYED_RELEASE_TABLET | Freq: Every day | ORAL | Status: DC
  Administered 2018-09-20 – 2018-09-23 (×4): 40 mg via ORAL
  Filled 2018-09-20 (×4): qty 1

## 2018-09-20 MED ORDER — BISACODYL 10 MG RE SUPP: 10.0000 mg | Freq: Every day | RECTAL | Status: DC | PRN

## 2018-09-20 MED ORDER — BUPIVACAINE IN DEXTROSE 0.75-8.25 % IT SOLN
INTRATHECAL | Status: DC | PRN
  Administered 2018-09-20: 1.3 mL via INTRATHECAL

## 2018-09-20 MED ORDER — TRAMADOL HCL 50 MG PO TABS
50.0000 mg | ORAL_TABLET | Freq: Four times a day (QID) | ORAL | Status: DC
  Administered 2018-09-20 – 2018-09-23 (×9): 50 mg via ORAL
  Filled 2018-09-20 (×9): qty 1

## 2018-09-20 MED ORDER — HYDROCODONE-ACETAMINOPHEN 7.5-325 MG PO TABS
1.0000 | ORAL_TABLET | ORAL | Status: DC | PRN
  Administered 2018-09-20 – 2018-09-21 (×3): 1 via ORAL
  Filled 2018-09-20: qty 2
  Filled 2018-09-20 (×2): qty 1

## 2018-09-20 MED ORDER — MIDAZOLAM HCL 5 MG/5ML IJ SOLN
INTRAMUSCULAR | Status: DC | PRN
  Administered 2018-09-20: 1 mg via INTRAVENOUS

## 2018-09-20 MED ORDER — DOCUSATE SODIUM 100 MG PO CAPS
100.0000 mg | ORAL_CAPSULE | Freq: Two times a day (BID) | ORAL | Status: DC
  Administered 2018-09-20 – 2018-09-23 (×7): 100 mg via ORAL
  Filled 2018-09-20 (×7): qty 1

## 2018-09-20 MED ORDER — GENTAMICIN SULFATE 40 MG/ML IJ SOLN
INTRAMUSCULAR | Status: AC
  Filled 2018-09-20: qty 2

## 2018-09-20 MED ORDER — LACTATED RINGERS IV SOLN
INTRAVENOUS | Status: DC | PRN
  Administered 2018-09-20: 07:00:00 via INTRAVENOUS

## 2018-09-20 MED ORDER — PREDNISONE 5 MG PO TABS
5.0000 mg | ORAL_TABLET | Freq: Every day | ORAL | Status: DC
  Administered 2018-09-20 – 2018-09-23 (×4): 5 mg via ORAL
  Filled 2018-09-20 (×4): qty 1

## 2018-09-20 MED ORDER — PROPRANOLOL HCL 20 MG PO TABS
20.0000 mg | ORAL_TABLET | Freq: Every day | ORAL | Status: DC
  Administered 2018-09-23: 20 mg via ORAL
  Filled 2018-09-20 (×4): qty 1

## 2018-09-20 MED ORDER — ALUM & MAG HYDROXIDE-SIMETH 200-200-20 MG/5ML PO SUSP: 30.0000 mL | ORAL | Status: DC | PRN

## 2018-09-20 MED ORDER — VITAMIN D 25 MCG (1000 UNIT) PO TABS
1000.0000 [IU] | ORAL_TABLET | Freq: Every day | ORAL | Status: DC
  Administered 2018-09-21 – 2018-09-23 (×3): 1000 [IU] via ORAL
  Filled 2018-09-20 (×3): qty 1

## 2018-09-20 MED ORDER — MENTHOL 3 MG MT LOZG: 1.0000 | LOZENGE | OROMUCOSAL | Status: DC | PRN

## 2018-09-20 MED ORDER — METHOCARBAMOL 1000 MG/10ML IJ SOLN
500.0000 mg | Freq: Four times a day (QID) | INTRAVENOUS | Status: DC | PRN
  Filled 2018-09-20: qty 5

## 2018-09-20 MED ORDER — HYDROCODONE-ACETAMINOPHEN 5-325 MG PO TABS
1.0000 | ORAL_TABLET | ORAL | Status: DC | PRN
  Administered 2018-09-22 (×2): 1 via ORAL
  Filled 2018-09-20 (×2): qty 1

## 2018-09-20 MED ORDER — ZOLPIDEM TARTRATE 5 MG PO TABS: 5.0000 mg | ORAL_TABLET | Freq: Every evening | ORAL | Status: DC | PRN

## 2018-09-20 MED ORDER — PROPOFOL 500 MG/50ML IV EMUL
INTRAVENOUS | Status: AC
  Filled 2018-09-20: qty 50

## 2018-09-20 MED ORDER — ACETAMINOPHEN 500 MG PO TABS
500.0000 mg | ORAL_TABLET | Freq: Four times a day (QID) | ORAL | Status: AC
  Administered 2018-09-20 – 2018-09-21 (×3): 500 mg via ORAL
  Filled 2018-09-20 (×3): qty 1

## 2018-09-20 MED ORDER — ENOXAPARIN SODIUM 30 MG/0.3ML ~~LOC~~ SOLN: 30.0000 mg | SUBCUTANEOUS | Status: DC

## 2018-09-20 MED ORDER — SODIUM CHLORIDE 0.9 % IV SOLN
INTRAVENOUS | Status: DC
  Administered 2018-09-20 – 2018-09-21 (×4): via INTRAVENOUS

## 2018-09-20 MED ORDER — METHOCARBAMOL 500 MG PO TABS
500.0000 mg | ORAL_TABLET | Freq: Four times a day (QID) | ORAL | Status: DC | PRN
  Administered 2018-09-21: 500 mg via ORAL
  Filled 2018-09-20 (×2): qty 1

## 2018-09-20 MED ORDER — MAGNESIUM HYDROXIDE 400 MG/5ML PO SUSP
30.0000 mL | Freq: Every day | ORAL | Status: DC | PRN
  Filled 2018-09-20: qty 30

## 2018-09-20 MED ORDER — SODIUM CHLORIDE 0.9 % IV SOLN
INTRAVENOUS | Status: DC | PRN
  Administered 2018-09-20: 500 mL

## 2018-09-20 MED ORDER — VITAMIN B-12 1000 MCG PO TABS
1000.0000 ug | ORAL_TABLET | Freq: Every day | ORAL | Status: DC
  Administered 2018-09-21 – 2018-09-23 (×3): 1000 ug via ORAL
  Filled 2018-09-20 (×3): qty 1

## 2018-09-20 MED ORDER — KETAMINE HCL 50 MG/ML IJ SOLN
INTRAMUSCULAR | Status: AC
  Filled 2018-09-20: qty 10

## 2018-09-20 MED ORDER — MIDAZOLAM HCL 2 MG/2ML IJ SOLN
INTRAMUSCULAR | Status: AC
  Filled 2018-09-20: qty 2

## 2018-09-20 SURGICAL SUPPLY — 34 items
BIT DRILL 4.3MMS DISTAL GRDTED (BIT) ×1 IMPLANT
CANISTER SUCT 1200ML W/VALVE (MISCELLANEOUS) ×4 IMPLANT
CHLORAPREP W/TINT 26 (MISCELLANEOUS) ×2 IMPLANT
COVER WAND RF STERILE (DRAPES) ×2 IMPLANT
DRAPE SHEET LG 3/4 BI-LAMINATE (DRAPES) ×4 IMPLANT
DRAPE U-SHAPE 47X51 STRL (DRAPES) ×2 IMPLANT
DRILL 4.3MMS DISTAL GRADUATED (BIT) ×2
DRSG OPSITE POSTOP 3X4 (GAUZE/BANDAGES/DRESSINGS) ×6 IMPLANT
GLOVE BIOGEL PI IND STRL 9 (GLOVE) ×2 IMPLANT
GLOVE BIOGEL PI INDICATOR 9 (GLOVE) ×2
GLOVE SURG SYN 9.0  PF PI (GLOVE) ×1
GLOVE SURG SYN 9.0 PF PI (GLOVE) ×1 IMPLANT
GOWN SRG 2XL LVL 4 RGLN SLV (GOWNS) ×1 IMPLANT
GOWN STRL NON-REIN 2XL LVL4 (GOWNS) ×1
GOWN STRL REUS W/ TWL LRG LVL3 (GOWN DISPOSABLE) ×1 IMPLANT
GOWN STRL REUS W/TWL LRG LVL3 (GOWN DISPOSABLE) ×1
GUIDEPIN VERSANAIL DSP 3.2X444 (ORTHOPEDIC DISPOSABLE SUPPLIES) ×2 IMPLANT
GUIDEWIRE BALL NOSE 80CM (WIRE) ×2 IMPLANT
KIT TURNOVER KIT A (KITS) ×2 IMPLANT
MAT ABSORB  FLUID 56X50 GRAY (MISCELLANEOUS) ×1
MAT ABSORB FLUID 56X50 GRAY (MISCELLANEOUS) ×1 IMPLANT
NEEDLE FILTER BLUNT 18X 1/2SAF (NEEDLE) ×1
NEEDLE FILTER BLUNT 18X1 1/2 (NEEDLE) ×1 IMPLANT
NS IRRIG 500ML POUR BTL (IV SOLUTION) ×2 IMPLANT
PACK HIP COMPR (MISCELLANEOUS) ×2 IMPLANT
SCALPEL PROTECTED #15 DISP (BLADE) ×4 IMPLANT
SCREW BONE CORTICAL 5.0X42 (Screw) ×2 IMPLANT
SCREW LAG HIP NAIL 10.5X95 (Screw) ×2 IMPLANT
SCREW LAG HIP NAIL 11X320 (Screw) ×2 IMPLANT
SCREWDRIVER HEX TIP 3.5MM (MISCELLANEOUS) ×2 IMPLANT
STAPLER SKIN PROX 35W (STAPLE) ×2 IMPLANT
SUT VIC AB 1 CT1 36 (SUTURE) ×2 IMPLANT
SUT VIC AB 2-0 CT1 (SUTURE) ×2 IMPLANT
SYR 10ML LL (SYRINGE) ×2 IMPLANT

## 2018-09-20 NOTE — Progress Notes (Signed)
Pt off floor to OR

## 2018-09-20 NOTE — Op Note (Signed)
09/20/2018  8:43 AM  PATIENT:  Lauren Roman  85 y.o. female  PRE-OPERATIVE DIAGNOSIS:  Left Femur Fracture, unstable intertrochanteric  POST-OPERATIVE DIAGNOSIS:  Left Femur Fracture, unstable intertrochanteric  PROCEDURE:  Procedure(s): INTRAMEDULLARY (IM) NAIL INTERTROCHANTRIC (Left)  SURGEON: Leitha Schuller, MD  ASSISTANTS: None  ANESTHESIA:   spinal  EBL:  Total I/O In: 400 [I.V.:400] Out: 100 [Blood:100]  BLOOD ADMINISTERED:none  DRAINS: none   LOCAL MEDICATIONS USED:  NONE  SPECIMEN:  No Specimen  DISPOSITION OF SPECIMEN:  N/A  COUNTS:  YES  TOURNIQUET:  * No tourniquets in log *  IMPLANTS: Biomet affixes 11 x 320 left rod with 95 mm leg screw 42 mm interlocking screw  DICTATION: .Dragon Dictation patient was brought to the operating room and after adequate spinal anesthesia was obtained she was transferred to the fracture table with the right leg in the well-leg holder and traction applied on the left side through the traction boot.  C arm was brought in and anatomic alignment was obtained.  The hip was then prepped and draped in the usual barrier drape method and appropriate patient identification and timeout procedures were completed.  A small incision was made proximal to the greater trochanter and a guidewire inserted into the tip of the trochanter centered on the femur on the lateral view.  Proximal reaming was carried out and a guidewire inserted down the canal length determined.  Reaming was carried out to 13 mm and the 11 mm x 320 rod was inserted down the canal without difficulty.  A small lateral incision was made and the guide for the leg screw was placed with the wires placed in a near center center position.  Measurement was made drilling carried out in the 95 mm leg screw placed followed by tightening the proximal screw to keep the screw from getting loose and then a quarter turn loosening to allow for further compression.  Traction was released at this  point and traction applied through the compression device on the implant.  Next the insertion handle was removed with after permanent C arm views were obtained proximally.  Distally the oblique hole was filled using standard technique with a lateral view on the C arm.  Drilling measuring and placing the 42 mm screw was placed with a final C arm view in the AP view.  The wounds were irrigated and the deep fascia repaired using #1 Vicryl 2-0 Vicryl subcutaneously and skin staples followed by honeycomb dressing.  PLAN OF CARE: Continue as inpatient  PATIENT DISPOSITION:  PACU - hemodynamically stable.

## 2018-09-20 NOTE — Anesthesia Post-op Follow-up Note (Signed)
Anesthesia QCDR form completed.        

## 2018-09-20 NOTE — Progress Notes (Signed)
PHARMACIST - PHYSICIAN COMMUNICATION  CONCERNING:  Enoxaparin (Lovenox) for DVT Prophylaxis    RECOMMENDATION: Patient was prescribed enoxaprin 30mg  q24 hours for VTE prophylaxis.   Filed Weights   09/19/18 1226 09/20/18 0709  Weight: 118 lb 9.7 oz (53.8 kg) 118 lb 9.7 oz (53.8 kg)    Body mass index is 23.16 kg/m.  Estimated Creatinine Clearance: 35.6 mL/min (by C-G formula based on SCr of 0.53 mg/dL).   Patient is candidate for enoxaparin 40mg  every 24 hours based on CrCl >34ml/min and Weight > 45kg for female   DESCRIPTION: Pharmacy has adjusted enoxaparin dose per [facility] policy.   Patient is now receiving enoxaparin 40mg  every 24 hours.  Katha Cabal, PharmD Clinical Pharmacist  09/20/2018 1:28 PM

## 2018-09-20 NOTE — Plan of Care (Signed)
Pt resting comfortably. Pt has no complaints of pain at this time.

## 2018-09-20 NOTE — TOC Initial Note (Signed)
Transition of Care [facility]) - Initial/Assessment Note    Patient Details  Name: Lauren Roman MRN: [id number] Date of Birth: Sep 25, 1928  Transition of Care [facility] CM/SW Contact:    Barrie Dunker, RN Phone Number: 09/20/2018, 11:18 AM  Clinical Narrative:                 Spoke with the patient to discuss DC plan and needs She stated that her brother lives with her and hekps her,  She has a RW and BSC at home She does not want to go to rehab but wants to go home, her daughter provides her transportation and helps her also She stated that she is accepting of [facility], [facility]ower at [facility] She does not need DME at this time Her PCP is Dr. Hyacinth Meeker She uses [facility] and can afford her medication Continue to monitor for needs  Expected Discharge Plan: Home w Home Health Services Barriers to Discharge: Continued Medical Work up   Patient Goals and CMS Choice Patient states their goals for this hospitalization and ongoing recovery are:: to go home CMS Medicare.gov Compare Post Acute Care list provided to:: Patient    Expected Discharge Plan and Services Expected Discharge Plan: Home w Home Health Services   Discharge Planning Services: CM Consult Post Acute Care Choice: Home Health Living arrangements for the past 2 months: Single Family Home                           HH Arranged: PT, Nurse's Aide HH Agency: [facility] Date HH Agency Contacted: 09/20/18 Time HH Agency Contacted: 1116 Representative spoke with at [facility] Agency: Barbara Cower  Prior Living Arrangements/Services Living arrangements for the past 2 months: Single Family Home Lives with:: Relatives Patient language and need for interpreter reviewed:: No Do you feel safe going back to the place where you live?: Yes      Need for Family Participation in Patient Care: No (Comment) Care giver support system in place?: Yes (comment) Current home services: DME(walker,  bed side commode) Criminal Activity/Legal Involvement Pertinent to Current Situation/Hospitalization: No - Comment as needed  Activities of Daily Living Home Assistive Devices/Equipment: Dan Humphreys (specify type), Grab bars in shower, Cane (specify quad or straight) ADL Screening (condition at time of admission) Patient's cognitive ability adequate to safely complete daily activities?: Yes(occasionally confused) Is the patient deaf or have difficulty hearing?: No Does the patient have difficulty seeing, even when wearing glasses/contacts?: No Does the patient have difficulty concentrating, remembering, or making decisions?: Yes Patient able to express need for assistance with ADLs?: Yes Does the patient have difficulty dressing or bathing?: No Independently performs ADLs?: Yes (appropriate for developmental age) Does the patient have difficulty walking or climbing stairs?: Yes Weakness of Legs: Left Weakness of Arms/Hands: None  Permission Sought/Granted                  Emotional Assessment Appearance:: Appears stated age Attitude/Demeanor/Rapport: Engaged Affect (typically observed): Accepting Orientation: : Oriented to Self, Oriented to Place, Oriented to  Time, Oriented to Situation Alcohol / Substance Use: Never Used Psych Involvement: No (comment)  Admission diagnosis:  Closed fracture dislocation of left hip joint, initial encounter (HCC) [S72.002A] Dementia without behavioral disturbance, unspecified dementia type (HCC) [F03.90] Patient Active Problem List   Diagnosis Date Noted  . Closed left hip fracture (HCC) 09/19/2018  . Lumbar radiculopathy 01/30/2018   PCP:  Hyacinth Meeker,  Hardin NegusMark F, MD Pharmacy:   [facility] [place], [place] - [address] [place] [address] Phone: [phone] Fax: [phone]     Social Determinants of Health (SDOH) Interventions    Readmission Risk Interventions No flowsheet data found.

## 2018-09-20 NOTE — Progress Notes (Signed)
[employer] - [place] at [facility]   PATIENT NAME: Kathalene Kersey    MR#:  [id number]  DATE OF BIRTH:  06-19-1928  SUBJECTIVE:  CHIEF COMPLAINT:   Chief Complaint  Patient presents with  . Fall  Patient seen and evaluated today Has pain in the left hip Status post hip surgery No complaints of chest pain No shortness of breath  REVIEW OF SYSTEMS:    ROS  CONSTITUTIONAL: No documented fever. No fatigue, weakness. No weight gain, no weight loss.  EYES: No blurry or double vision.  ENT: No tinnitus. No postnasal drip. No redness of the oropharynx.  RESPIRATORY: No cough, no wheeze, no hemoptysis. No dyspnea.  CARDIOVASCULAR: No chest pain. No orthopnea. No palpitations. No syncope.  GASTROINTESTINAL: No nausea, no vomiting or diarrhea. No abdominal pain. No melena or hematochezia.  GENITOURINARY: No dysuria or hematuria.  ENDOCRINE: No polyuria or nocturia. No heat or cold intolerance.  HEMATOLOGY: No anemia. No bruising. No bleeding.  INTEGUMENTARY: No rashes. No lesions.  MUSCULOSKELETAL: No arthritis. No swelling. No gout.  Has left hip pain NEUROLOGIC: No numbness, tingling, or ataxia. No seizure-type activity.  PSYCHIATRIC: No anxiety. No insomnia. No ADD.   DRUG ALLERGIES:   Allergies  Allergen Reactions  . Exelon [Rivastigmine Tartrate]     Patient unsure of reaction  . Other Itching and Rash    Anything metal on skin will cause rash and itching    VITALS:  Blood pressure (!) 111/43, pulse 74, temperature 98.5 F (36.9 C), resp. rate 16, height 5' (1.524 m), weight 53.8 kg, SpO2 98 %.  PHYSICAL EXAMINATION:   Physical Exam  GENERAL:  85 y.o.-year-old patient lying in the bed with no acute distress.  EYES: Pupils equal, round, reactive to light and accommodation. No scleral icterus. Extraocular muscles intact.  HEENT: Head atraumatic, normocephalic. Oropharynx and nasopharynx clear.  NECK:  Supple, no jugular venous distention. No thyroid  enlargement, no tenderness.  LUNGS: Normal breath sounds bilaterally, no wheezing, rales, rhonchi. No use of accessory muscles of respiration.  CARDIOVASCULAR: S1, S2 normal. No murmurs, rubs, or gallops.  ABDOMEN: Soft, nontender, nondistended. Bowel sounds present. No organomegaly or mass.  EXTREMITIES: No cyanosis, clubbing or edema b/l.  Left hip bandage noted No discharge noted Tenderness in the left hip NEUROLOGIC: Cranial nerves II through XII are intact. No focal Motor or sensory deficits b/l.   PSYCHIATRIC: The patient is alert and oriented x 3.  SKIN: No obvious rash, lesion, or ulcer.   LABORATORY PANEL:   CBC Recent Labs  Lab 09/20/18 0352  WBC 11.5*  HGB 11.7*  HCT 35.5*  PLT 255   ------------------------------------------------------------------------------------------------------------------ Chemistries  Recent Labs  Lab 09/20/18 0352  NA 137  K 4.1  CL 104  CO2 24  GLUCOSE 86  BUN 15  CREATININE 0.53  CALCIUM 8.7*   ------------------------------------------------------------------------------------------------------------------  Cardiac Enzymes No results for input(s): TROPONINI in the last 168 hours. ------------------------------------------------------------------------------------------------------------------  RADIOLOGY:  Dg Chest 1 View  Result Date: 09/19/2018 CLINICAL DATA:  Left hip pain status post fall. Pain status post fall. EXAM: CHEST  1 VIEW COMPARISON:  None. FINDINGS: There is no pneumothorax. No displaced rib fracture. The cardiac silhouette is enlarged. There is no focal consolidation. IMPRESSION: 1. No acute cardiopulmonary process identified. 2. Cardiomegaly. Electronically Signed   By: Katherine Mantle M.D.   On: 09/19/2018 14:12   Dg Hip Operative Unilat W Or W/o Pelvis Left  Result Date: 09/20/2018 CLINICAL DATA:  LEFT hip  fracture. EXAM: OPERATIVE LEFT HIP (WITH PELVIS IF PERFORMED) 6 VIEWS TECHNIQUE: Fluoroscopic spot  image(s) were submitted for interpretation post-operatively. COMPARISON:  None. FINDINGS: Fracture has been reduced, with satisfactory position and alignment status post placement of compression screw with intramedullary rod. IMPRESSION: Satisfactory postoperative appearance. Electronically Signed   By: Elsie StainJohn T Curnes M.D.   On: 09/20/2018 08:40   Dg Hip Unilat W Or Wo Pelvis 2-3 Views Left  Result Date: 09/19/2018 CLINICAL DATA:  Hip pain EXAM: DG HIP (WITH OR WITHOUT PELVIS) 2-3V LEFT COMPARISON:  None. FINDINGS: There is an acute displaced intratrochanteric fracture of the proximal left femur. There is no dislocation. There is diffuse osteopenia of the visualized osseous structures. There is a large amount of stool in the colon. IMPRESSION: Acute displaced inter trochanteric fracture of the left hip. No dislocation. Diffuse osteopenia is noted. Electronically Signed   By: Katherine Mantlehristopher  Green M.D.   On: 09/19/2018 14:12     ASSESSMENT AND PLAN:  85 year old female patient with history of hyperlipidemia, dementia, myasthenia gravis, Hyperlipidemia admitted to hospitalist service status post fall  -Left hip fracture Status post ORIF procedure Physical therapy from tomorrow Pain management Orthopedic follow-up  -Leukocytosis improving probably secondary to stress  -Myasthenia gravis stable  -Dementia supportive care  -DVT prophylaxis subcu Lovenox daily  -Left hip pain Pain management with oral Norco and PRN IV morphine   All the records are reviewed and case discussed with Care Management/Social Worker. Management plans discussed with the patient, family and they are in agreement.  CODE STATUS: Full code  DVT Prophylaxis: SCDs  TOTAL TIME TAKING CARE OF THIS PATIENT: 37 minutes.   POSSIBLE D/C IN 2 to 3 DAYS, DEPENDING ON CLINICAL CONDITION.  Ihor AustinPavan Pyreddy M.D on 09/20/2018 at 12:49 PM  Between 7am to 6pm - Pager - [phone]  After 6pm go to [email or web address] - password [email or web address]  [facility]  [facility]  Office  [phone]  CC: Primary care physician; Danella PentonMiller, Mark F, MD  Note: This dictation was prepared with Dragon dictation along with smaller phrase technology. Any transcriptional errors that result from this process are unintentional.

## 2018-09-20 NOTE — Transfer of Care (Signed)
Immediate Anesthesia Transfer of Care Note  Patient: Lauren Roman  Procedure(s) Performed: INTRAMEDULLARY (IM) NAIL INTERTROCHANTRIC (Left )  Patient Location: PACU  Anesthesia Type:Spinal  Level of Consciousness: drowsy and patient cooperative  Airway & Oxygen Therapy: Patient Spontanous Breathing and Patient connected to nasal cannula oxygen  Post-op Assessment: Report given to RN and Post -op Vital signs reviewed and stable  Post vital signs: Reviewed and stable  Last Vitals:  Vitals Value Taken Time  BP 99/47 09/20/2018  8:45 AM  Temp 36.5 C 09/20/2018  8:43 AM  Pulse 70 09/20/2018  8:48 AM  Resp 13 09/20/2018  8:48 AM  SpO2 100 % 09/20/2018  8:48 AM  Vitals shown include unvalidated device data.  Last Pain:  Vitals:   09/20/18 0709  TempSrc: Tympanic  PainSc: 8       Patients Stated Pain Goal: 2 (09/20/18 0709)  Complications: No apparent anesthesia complications

## 2018-09-20 NOTE — Anesthesia Procedure Notes (Signed)
Spinal  Patient location during procedure: OR Staffing Anesthesiologist: Termaine Roupp, MD Performed: anesthesiologist  Preanesthetic Checklist Completed: patient identified, site marked, surgical consent, pre-op evaluation, timeout performed, IV checked and risks and benefits discussed Spinal Block Patient position: sitting Prep: ChloraPrep Patient monitoring: heart rate, cardiac monitor, continuous pulse ox and blood pressure Approach: midline Location: L3-4 Injection technique: single-shot Needle Needle type: Pencil-Tip  Needle gauge: 25 G Needle length: 9 cm Assessment Sensory level: T10     

## 2018-09-20 NOTE — Progress Notes (Signed)
Consent obtained for pt surgery by daughter, Genella Mech. Verified by pdowless, rn and DPinkerton,rn at [facility] 09/20/2018

## 2018-09-21 LAB — CBC
HCT: 32 % — ABNORMAL LOW (ref 36.0–46.0)
Hemoglobin: 10.5 g/dL — ABNORMAL LOW (ref 12.0–15.0)
MCH: 31 pg (ref 26.0–34.0)
MCHC: 32.8 g/dL (ref 30.0–36.0)
MCV: 94.4 fL (ref 80.0–100.0)
Platelets: 214 10*3/uL (ref 150–400)
RBC: 3.39 MIL/uL — ABNORMAL LOW (ref 3.87–5.11)
RDW: 13.1 % (ref 11.5–15.5)
WBC: 14.9 10*3/uL — ABNORMAL HIGH (ref 4.0–10.5)
nRBC: 0 % (ref 0.0–0.2)

## 2018-09-21 LAB — BASIC METABOLIC PANEL
Anion gap: 6 (ref 5–15)
BUN: 11 mg/dL (ref 8–23)
CO2: 25 mmol/L (ref 22–32)
Calcium: 8.2 mg/dL — ABNORMAL LOW (ref 8.9–10.3)
Chloride: 105 mmol/L (ref 98–111)
Creatinine, Ser: 0.69 mg/dL (ref 0.44–1.00)
GFR calc Af Amer: >60 mL/min (ref >60)
GFR calc non Af Amer: >60 mL/min (ref >60)
Glucose, Bld: 95 mg/dL (ref 70–99)
Potassium: 3.8 mmol/L (ref 3.5–5.1)
Sodium: 136 mmol/L (ref 135–145)

## 2018-09-21 NOTE — Evaluation (Addendum)
Physical Therapy Evaluation Patient Details Name: Lauren Roman MRN: [id number] DOB: [birth year] Today's Date: 09/21/2018   History of Present Illness  Patient is a pleasant 85 year old female who presented to ED s/p fall onto L hip. Imaging shows an acute displaced intertrochanteric fracture of L hip and surgery on 09/20/18 for ORIF was performed. Patient's history limited due to dementia. PMH includes arthritis, dementia, HA, Hepatitisu, hyperlipidemia, myasthenia gravis, and tremor (right hand).   Clinical Impression  Patient is a pleasantly confused 85 year old female with limited mobility s/p ORIF and WBAT LLE. Patient requires frequent redirection and cueing due to forgetting task orientation. Patient required Mod-Max A with all mobility and was fearful of movement due to history of falling. History challenging to obtain due to history of dementia, patient is unsure of her baseline and housing, giving answers but stating "i'm not sure if this is right". Patient requires additional time for task performance and heavy guidance for sequencing. Stand pivot transfer required PT to guide walker as patient side shuffle stepped to chair. Patient will benefit from skilled physical therapy while in hospital to improve mobility, strength, and decrease fall's risk. Patient will benefit from SNF placement upon discharge due to unsafe mobility, high fall risk, and limited assistance at home for ADL performance.     Follow Up Recommendations SNF    Equipment Recommendations  None recommended by PT    Recommendations for Other Services       Precautions / Restrictions Precautions Precautions: Fall Restrictions Weight Bearing Restrictions: Yes LLE Weight Bearing: Weight bearing as tolerated      Mobility  Bed Mobility Overal bed mobility: Needs Assistance Bed Mobility: Supine to Sit     Supine to sit: Mod assist;Max assist     General bed mobility comments: Patient requires additional time  and sequencing, not able to move LLE without assitance, unable to lift trunk upright without assistance.   Transfers Overall transfer level: Needs assistance Equipment used: Rolling walker (2 wheeled) Transfers: Sit to/from [employer] Sit to Stand: Mod assist;Max assist Stand pivot transfers: Mod assist;Max assist       General transfer comment: Patient challenged with hand placement and muscle activation/sequencing for transfer. challenged due to fear of movement.   Ambulation/Gait Ambulation/Gait assistance: Mod assist Gait Distance (Feet): 3 Feet Assistive device: Rolling walker (2 wheeled)       General Gait Details: Requires PT to move walker for patient, shufle side steps to chair with extra time and max cueing for sequencing due to patient forgetting task.   Stairs            Wheelchair Mobility    Modified Rankin (Stroke Patients Only)       Balance Overall balance assessment: Needs assistance;History of Falls Sitting-balance support: Feet supported;Single extremity supported Sitting balance-Leahy Scale: Poor Sitting balance - Comments: requires SUE support for assistance  Postural control: Right lateral lean Standing balance support: Bilateral upper extremity supported Standing balance-Leahy Scale: Poor Standing balance comment: Patient requires heavy use of UE's due to fear of LOB and pain in LLE.                              Pertinent Vitals/Pain Pain Assessment: 0-10 Pain Score: 5  Pain Location: L hip  Pain Descriptors / Indicators: Aching Pain Intervention(s): Limited activity within patient's tolerance;Monitored during session;Repositioned    Home Living Family/patient expects to be discharged to:: Private  residence Living Arrangements: Other relatives(brother) Available Help at Discharge: Family(lives with brother and daughter comes over occasionally to help ) Type of Home: House Home Access: Stairs to  enter Entrance Stairs-Rails: Right(R for carport, L for front door) Entrance Stairs-Number of Steps: 3 carport 4 at front door  Home Layout: One level Home Equipment: Walker - 2 wheels;Grab bars - tub/shower Additional Comments: Patient's history is subjective due to baseline of dementia     Prior Function Level of Independence: Needs assistance   Gait / Transfers Assistance Needed: patient reports she walks with a walker at home  ADL's / Homemaking Assistance Needed: patient reports her daughter comes and helps a little bit occasionally, brother helps as well.   Comments: Patient confused with history so information is subjective due to memory.      Hand Dominance   Dominant Hand: Right    Extremity/Trunk Assessment   Upper Extremity Assessment Upper Extremity Assessment: Generalized weakness    Lower Extremity Assessment Lower Extremity Assessment: RLE deficits/detail;LLE deficits/detail RLE Deficits / Details: grossly 3+/5 RLE Sensation: (patient not able to tell : dementia) RLE Coordination: decreased gross motor LLE Deficits / Details: unable to fully assess due to pain LLE: Unable to fully assess due to pain LLE Sensation: (unable to assess due to dementia) LLE Coordination: decreased gross motor       Communication   Communication: No difficulties  Cognition Arousal/Alertness: Awake/alert Behavior During Therapy: WFL for tasks assessed/performed Overall Cognitive Status: No family/caregiver present to determine baseline cognitive functioning                                 General Comments: Patient has history of dementia. Is pleasant and oriented to herself and the fact she fell. confused about her home setup and PLOF      General Comments General comments (skin integrity, edema, etc.): bruising of bilateral LE's,     Exercises Total Joint Exercises Ankle Circles/Pumps: AROM;Both;10 reps;Supine Gluteal Sets: Strengthening;Both;10 reps Hip  ABduction/ADduction: Strengthening;Both;5 reps Long Arc Quad: Strengthening;Both;5 reps Other Exercises Other Exercises: safe transfers and mobility for decreased fall risk education and performance, education on weightbearing status.    Assessment/Plan    PT Assessment Patient needs continued PT services  PT Problem List Decreased strength;Decreased range of motion;Decreased activity tolerance;Decreased coordination;Decreased mobility;Decreased balance;Decreased cognition;Decreased knowledge of use of DME;Decreased safety awareness;Decreased knowledge of precautions;Pain       PT Treatment Interventions DME instruction;Gait training;Stair training;Functional mobility training;Therapeutic activities;Therapeutic exercise;Neuromuscular re-education;Balance training;Patient/family education;Manual techniques    PT Goals (Current goals can be found in the Care Plan section)  Acute Rehab PT Goals Patient Stated Goal: to walk better  PT Goal Formulation: With patient Time For Goal Achievement: 10/05/18 Potential to Achieve Goals: Fair    Frequency BID   Barriers to discharge Decreased caregiver support;Inaccessible home environment patient will need SNF due to limited mobility     Co-evaluation               AM-PAC PT "6 Clicks" Mobility  Outcome Measure Help needed turning from your back to your side while in a flat bed without using bedrails?: A Lot Help needed moving from lying on your back to sitting on the side of a flat bed without using bedrails?: A Lot Help needed moving to and from a bed to a chair (including a wheelchair)?: A Lot Help needed standing up from a chair using your arms (e.g.,  wheelchair or bedside chair)?: A Lot Help needed to walk in hospital room?: A Lot Help needed climbing 3-5 steps with a railing? : A Lot 6 Click Score: 12    End of Session Equipment Utilized During Treatment: Gait belt;Oxygen(1 L 02 via nasal cannula ) Activity Tolerance: Patient  limited by fatigue;Patient limited by pain Patient left: in chair;with chair alarm set;with call bell/phone within reach Nurse Communication: Mobility status;Other (comment)(in chair, need for oxygen due to desaturation ) PT Visit Diagnosis: Unsteadiness on feet (R26.81);Other abnormalities of gait and mobility (R26.89);Muscle weakness (generalized) (M62.81);History of falling (Z91.81);Difficulty in walking, not elsewhere classified (R26.2);Pain Pain - Right/Left: Left Pain - part of body: Hip    Time: [id number] PT Time Calculation (min) (ACUTE ONLY): 39 min   Charges:   PT Evaluation $PT Eval Moderate Complexity: 1 Mod PT Treatments $Therapeutic Activity: 23-37 mins        Precious BardMarina Clark Roman, PT, DPT    Precious BardMarina Genessis Roman 09/21/2018, 1:08 PM

## 2018-09-21 NOTE — Progress Notes (Signed)
[employer] -  at [facility]   PATIENT NAME: Lauren Roman    MR#:  [id number]  DATE OF BIRTH:  05-May-1929  SUBJECTIVE:   Patient is postoperative day #1 and doing well this morning.  No acute issues overnight.  REVIEW OF SYSTEMS:    Review of Systems  Constitutional: Negative for fever, chills weight loss HENT: Negative for ear pain, nosebleeds, congestion, facial swelling, rhinorrhea, neck pain, neck stiffness and ear discharge.   Respiratory: Negative for cough, shortness of breath, wheezing  Cardiovascular: Negative for chest pain, palpitations and leg swelling.  Gastrointestinal: Negative for heartburn, abdominal pain, vomiting, diarrhea or consitpation Genitourinary: Negative for dysuria, urgency, frequency, hematuria Musculoskeletal: Negative for back pain or joint pain Neurological: Negative for dizziness, seizures, syncope, focal weakness,  numbness and headaches.  Hematological: Does not bruise/bleed easily.  Psychiatric/Behavioral: Negative for hallucinations, confusion, dysphoric mood    Tolerating Diet: yes      DRUG ALLERGIES:   Allergies  Allergen Reactions  . Exelon [Rivastigmine Tartrate]     Patient unsure of reaction  . Other Itching and Rash    Anything metal on skin will cause rash and itching    VITALS:  Blood pressure (!) 119/51, pulse 87, temperature 98.2 F (36.8 C), temperature source Oral, resp. rate 14, height 5' (1.524 m), weight 53.8 kg, SpO2 90 %.  PHYSICAL EXAMINATION:  Constitutional: Appears well-developed and well-nourished. No distress. HENT: Normocephalic. Marland Kitchen. Oropharynx is clear and moist.  Eyes: Conjunctivae and EOM are normal. PERRLA, no scleral icterus.  Neck: Normal ROM. Neck supple. No JVD. No tracheal deviation. CVS: RRR, S1/S2 +, no murmurs, no gallops, no carotid bruit.  Pulmonary: Effort and breath sounds normal, no stridor, rhonchi, wheezes, rales.  Abdominal: Soft. BS +,  no distension,  tenderness, rebound or guarding.  Musculoskeletal: Normal range of motion. No edema and no tenderness.  Neuro: Alert. CN 2-12 grossly intact. No focal deficits. Skin: Skin is warm and dry. No rash noted. Dressing clean dry and intact no drainage Psychiatric: Normal mood and affect.      LABORATORY PANEL:   CBC Recent Labs  Lab 09/21/18 0459  WBC 14.9*  HGB 10.5*  HCT 32.0*  PLT 214   ------------------------------------------------------------------------------------------------------------------  Chemistries  Recent Labs  Lab 09/21/18 0459  NA 136  K 3.8  CL 105  CO2 25  GLUCOSE 95  BUN 11  CREATININE 0.69  CALCIUM 8.2*   ------------------------------------------------------------------------------------------------------------------  Cardiac Enzymes No results for input(s): TROPONINI in the last 168 hours. ------------------------------------------------------------------------------------------------------------------  RADIOLOGY:  Dg Chest 1 View  Result Date: 09/19/2018 CLINICAL DATA:  Left hip pain status post fall. Pain status post fall. EXAM: CHEST  1 VIEW COMPARISON:  None. FINDINGS: There is no pneumothorax. No displaced rib fracture. The cardiac silhouette is enlarged. There is no focal consolidation. IMPRESSION: 1. No acute cardiopulmonary process identified. 2. Cardiomegaly. Electronically Signed   By: Katherine Mantlehristopher  Green M.D.   On: 09/19/2018 14:12   Dg Hip Operative Unilat W Or W/o Pelvis Left  Result Date: 09/20/2018 CLINICAL DATA:  LEFT hip fracture. EXAM: OPERATIVE LEFT HIP (WITH PELVIS IF PERFORMED) 6 VIEWS TECHNIQUE: Fluoroscopic spot image(s) were submitted for interpretation post-operatively. COMPARISON:  None. FINDINGS: Fracture has been reduced, with satisfactory position and alignment status post placement of compression screw with intramedullary rod. IMPRESSION: Satisfactory postoperative appearance. Electronically Signed   By: Elsie StainJohn T Curnes  M.D.   On: 09/20/2018 08:40   Dg Hip Unilat W Or Wo Pelvis 2-3 Views  Left  Result Date: 09/19/2018 CLINICAL DATA:  Hip pain EXAM: DG HIP (WITH OR WITHOUT PELVIS) 2-3V LEFT COMPARISON:  None. FINDINGS: There is an acute displaced intratrochanteric fracture of the proximal left femur. There is no dislocation. There is diffuse osteopenia of the visualized osseous structures. There is a large amount of stool in the colon. IMPRESSION: Acute displaced inter trochanteric fracture of the left hip. No dislocation. Diffuse osteopenia is noted. Electronically Signed   By: Katherine Mantle M.D.   On: 09/19/2018 14:12     ASSESSMENT AND PLAN:   85 year old female with myasthenia gravis who presented to the ER with left hip pain after mechanical fall.  1.  Left hip fracture: Patient is postoperative day #1 INTRAMEDULLARY (IM) NAIL INTERTROCHANTRIC (Left).  Plan for physical therapy evaluation for discharge.  Continue PRN pain medications.  Appreciate orthopedic surgery consultation. DVT prophylaxis with Lovenox. Weightbearing as tolerated to left leg.   2.  History of myasthenia gravis: Patient is stable   3.  Hx of tremor: Continue Inderal 4.  Leukocytosis which is a stress response from fall and acute fracture: There are no signs of infection.   Management plans discussed with the patient and she is in agreement.  CODE STATUS: full  TOTAL TIME TAKING CARE OF THIS PATIENT: 30 minutes.     POSSIBLE D/C 1-2 days, DEPENDING ON CLINICAL CONDITION.   Adrian Saran M.D on 09/21/2018 at 11:18 AM  Between 7am to 6pm - Pager - [phone] After 6pm go to [email or web address] - password [email or web address]  [facility]  Office  [phone]  CC: Primary care physician; Danella Penton, MD  Note: This dictation was prepared with Dragon dictation along with smaller phrase technology. Any transcriptional errors that result from this process are unintentional.

## 2018-09-21 NOTE — Anesthesia Postprocedure Evaluation (Signed)
Anesthesia Post Note  Patient: Lauren Roman  Procedure(s) Performed: INTRAMEDULLARY (IM) NAIL INTERTROCHANTRIC (Left )  Patient location during evaluation: Nursing Unit Anesthesia Type: Spinal Level of consciousness: awake and alert and oriented Pain management: pain level controlled Vital Signs Assessment: post-procedure vital signs reviewed and stable Respiratory status: spontaneous breathing Cardiovascular status: blood pressure returned to baseline Postop Assessment: no headache and no backache Anesthetic complications: no     Last Vitals:  Vitals:   09/21/18 0446 09/21/18 0835  BP: (!) 128/51 (!) 119/51  Pulse: 77 87  Resp: 14   Temp: 36.8 C 36.8 C  SpO2: 96% 90%    Last Pain:  Vitals:   09/21/18 0835  TempSrc: Oral  PainSc:                  [name]

## 2018-09-21 NOTE — Progress Notes (Signed)
   Subjective: 1 Day Post-Op Procedure(s) (LRB): INTRAMEDULLARY (IM) NAIL INTERTROCHANTRIC (Left) Patient reports pain as moderate. Left hip pain improving Patient is well, and has had no acute complaints or problems Denies any CP, SOB, ABD pain. We will continue therapy today.   Objective: Vital signs in last 24 hours: Temp:  [97.4 F (36.3 C)-98.7 F (37.1 C)] 98.2 F (36.8 C) (05/09 0835) Pulse Rate:  [67-87] 87 (05/09 0835) Resp:  [12-16] 14 (05/09 0446) BP: (102-143)/(43-63) 119/51 (05/09 0835) SpO2:  [90 %-100 %] 90 % (05/09 0835)  Intake/Output from previous day: 05/08 0701 - 05/09 0700 In: 579.5 [P.O.:120; I.V.:459.5] Out: 1025 [Urine:925; Blood:100] Intake/Output this shift: No intake/output data recorded.  Recent Labs    09/19/18 1323 09/20/18 0352 09/21/18 0459  HGB 12.8 11.7* 10.5*   Recent Labs    09/20/18 0352 09/21/18 0459  WBC 11.5* 14.9*  RBC 3.86* 3.39*  HCT 35.5* 32.0*  PLT 255 214   Recent Labs    09/20/18 0352 09/21/18 0459  NA 137 136  K 4.1 3.8  CL 104 105  CO2 24 25  BUN 15 11  CREATININE 0.53 0.69  GLUCOSE 86 95  CALCIUM 8.7* 8.2*   Recent Labs    09/19/18 1323  INR 1.0    EXAM General - Patient is Alert, Appropriate and Oriented Extremity - Neurovascular intact Sensation intact distally Intact pulses distally Dorsiflexion/Plantar flexion intact No cellulitis present Compartment soft Dressing - dressing C/D/I and no drainage Motor Function - intact, moving foot and toes well on exam.   Past Medical History:  Diagnosis Date  . Arthritis   . Dementia (HCC)    mild  . Headache    migraines  . Hepatitis 1952   hep b-no problems since then  . Hyperlipidemia   . Myasthenia gravis (HCC)   . Tremor    right hand    Assessment/Plan:   1 Day Post-Op Procedure(s) (LRB): INTRAMEDULLARY (IM) NAIL INTERTROCHANTRIC (Left) Active Problems:   Closed left hip fracture (HCC)  Estimated body mass index is 23.16 kg/m  as calculated from the following:   Height as of this encounter: 5' (1.524 m).   Weight as of this encounter: 53.8 kg. Advance diet Up with therapy, WBAT Needs bowel movement Vital signs stable Labs stable Recheck labs in the morning Patient and family prefer to go home with home health PT.  Will check progress with PT over the next couple of days.   DVT Prophylaxis - Lovenox, TED hose and SCDs Weight-Bearing as tolerated to left leg   T. Cranston Neighbor, PA-C [facility] 09/21/2018, 8:53 AM

## 2018-09-21 NOTE — Progress Notes (Signed)
Foley removed without difficulty. Pt instructed to call for help when need to urinate. Pt verbalized understanding but teaching needs reinforcement. Pdiowless, rn

## 2018-09-22 ENCOUNTER — Inpatient Hospital Stay: Payer: Medicare Other

## 2018-09-22 LAB — BASIC METABOLIC PANEL
Anion gap: 6 (ref 5–15)
BUN: 12 mg/dL (ref 8–23)
CO2: 24 mmol/L (ref 22–32)
Calcium: 8 mg/dL — ABNORMAL LOW (ref 8.9–10.3)
Chloride: 104 mmol/L (ref 98–111)
Creatinine, Ser: 0.6 mg/dL (ref 0.44–1.00)
GFR calc Af Amer: >60 mL/min (ref >60)
GFR calc non Af Amer: >60 mL/min (ref >60)
Glucose, Bld: 99 mg/dL (ref 70–99)
Potassium: 3.7 mmol/L (ref 3.5–5.1)
Sodium: 134 mmol/L — ABNORMAL LOW (ref 135–145)

## 2018-09-22 LAB — CBC
HCT: 30.5 % — ABNORMAL LOW (ref 36.0–46.0)
Hemoglobin: 10.2 g/dL — ABNORMAL LOW (ref 12.0–15.0)
MCH: 31.1 pg (ref 26.0–34.0)
MCHC: 33.4 g/dL (ref 30.0–36.0)
MCV: 93 fL (ref 80.0–100.0)
Platelets: 203 10*3/uL (ref 150–400)
RBC: 3.28 MIL/uL — ABNORMAL LOW (ref 3.87–5.11)
RDW: 13.1 % (ref 11.5–15.5)
WBC: 16.3 10*3/uL — ABNORMAL HIGH (ref 4.0–10.5)
nRBC: 0 % (ref 0.0–0.2)

## 2018-09-22 MED ORDER — ENOXAPARIN SODIUM 40 MG/0.4ML ~~LOC~~ SOLN: 40.0000 mg | SUBCUTANEOUS | 0 refills | Status: DC

## 2018-09-22 MED ORDER — HYDROCODONE-ACETAMINOPHEN 5-325 MG PO TABS: 1.0000 | ORAL_TABLET | ORAL | 0 refills | Status: DC | PRN

## 2018-09-22 NOTE — Progress Notes (Signed)
Physical Therapy Treatment Patient Details Name: Lauren PickettDorothy G Roman MRN: [id number] DOB: Jan 07, 1929 Today's Date: 09/22/2018    History of Present Illness Patient is a pleasant 85 year old female who presented to ED s/p fall onto L hip. Imaging shows an acute displaced intertrochanteric fracture of L hip and surgery on 09/20/18 for ORIF was performed. Patient's history limited due to dementia. PMH includes arthritis, dementia, HA, Hepatitisu, hyperlipidemia, myasthenia gravis, and tremor (right hand).     PT Comments    Patient supine in bed and amenable to therapy despite reporting increased pain in the L hip. Patient able to perform all bed exercises with min encouragement and against manual resistance as tolerated. Patient demonstrating improved ROM on supine LLE knee/hip flexion with increased repetition and demonstrates excellent motivation to participate fully in therapy. Patient educated on the importance of regular movement to decrease pain and improve tolerance to activity. Patient eager to continue session, however needed to be transported to imaging for an x-ray. Patient expressed eagerness to attempt standing later. Current POC remains appropriate and patient will continue to benefit from skilled therapeutic intervention to address deficits in strength, mobility, balance, and activity tolerance.     Follow Up Recommendations  SNF     Equipment Recommendations  None recommended by PT    Recommendations for Other Services       Precautions / Restrictions Precautions Precautions: Fall Restrictions Weight Bearing Restrictions: Yes LLE Weight Bearing: Weight bearing as tolerated    Mobility  Bed Mobility Overal bed mobility: Needs Assistance                Transfers Overall transfer level: Needs assistance                  Ambulation/Gait                 Stairs             Wheelchair Mobility    Modified Rankin (Stroke Patients Only)        Balance                                            Cognition Arousal/Alertness: Awake/alert Behavior During Therapy: WFL for tasks assessed/performed Overall Cognitive Status: Within Functional Limits for tasks assessed                                        Exercises Total Joint Exercises Ankle Circles/Pumps: AROM;Both;Supine;20 reps Gluteal Sets: Strengthening;Both;10 reps Heel Slides: Strengthening;Both;15 reps Hip ABduction/ADduction: Strengthening;Both;15 reps    General Comments        Pertinent Vitals/Pain Pain Assessment: 0-10 Pain Score: 5  Pain Location: L hip  Pain Descriptors / Indicators: Aching Pain Intervention(s): Limited activity within patient's tolerance;Monitored during session;Repositioned    Home Living                      Prior Function            PT Goals (current goals can now be found in the care plan section) Acute Rehab PT Goals Patient Stated Goal: to walk better  PT Goal Formulation: With patient Time For Goal Achievement: 10/05/18 Potential to Achieve Goals: Fair Progress towards PT goals: Progressing toward goals    Frequency  BID      PT Plan Current plan remains appropriate    Co-evaluation              AM-PAC PT "6 Clicks" Mobility   Outcome Measure  Help needed turning from your back to your side while in a flat bed without using bedrails?: A Lot Help needed moving from lying on your back to sitting on the side of a flat bed without using bedrails?: A Lot Help needed moving to and from a bed to a chair (including a wheelchair)?: A Lot Help needed standing up from a chair using your arms (e.g., wheelchair or bedside chair)?: A Lot Help needed to walk in hospital room?: A Lot Help needed climbing 3-5 steps with a railing? : A Lot 6 Click Score: 12    End of Session Equipment Utilized During Treatment: Gait belt;Oxygen(1 L 02 via nasal cannula ) Activity  Tolerance: Patient limited by pain;Other (comment)(Patient transported to radiology mid-session.) Patient left: in bed;with nursing/sitter in room   PT Visit Diagnosis: Unsteadiness on feet (R26.81);Other abnormalities of gait and mobility (R26.89);Muscle weakness (generalized) (M62.81);History of falling (Z91.81);Difficulty in walking, not elsewhere classified (R26.2);Pain Pain - Right/Left: Left Pain - part of body: Hip     Time: 2336-1224 PT Time Calculation (min) (ACUTE ONLY): 20 min  Charges:  $Therapeutic Exercise: 8-22 mins                    Sheria Lang PT, DPT [phone] 09/22/2018, 11:52 AM

## 2018-09-22 NOTE — TOC Progression Note (Signed)
Transition of Care [facility]) - Progression Note    Patient Details  Name: Lauren Roman MRN: [id number] Date of Birth: 1928-05-23  Transition of Care [facility]) CM/SW Contact  Judi Cong, [place] Phone Number: 09/22/2018, 10:40 AM  Clinical Narrative:   The CSW contacted the patient's daughter, Lauren Roman, to confirm that she declines SNF for next venue of care. Lauren Roman shared that she will be having back surgery on Thursday, and she has changed her mind and wants to pursue SNF. The CSW explained the referral process and Medicare payment. Lauren Roman had no further questions.  The CSW has sent the referral and will follow up with Lauren Roman to provide bed offers. The [facility] team is following.     Expected Discharge Plan: Home w Home Health Services Barriers to Discharge: Continued Medical Work up  Expected Discharge Plan and Services Expected Discharge Plan: Home w Home Health Services   Discharge Planning Services: CM Consult Post Acute Care Choice: Home Health Living arrangements for the past 2 months: Single Family Home                           HH Arranged: PT, Nurse's Aide HH Agency: [facility] Date HH Agency Contacted: 09/20/18 Time HH Agency Contacted: 1116 Representative spoke with at [facility] Agency: Barbara Cower   Social Determinants of Health (SDOH) Interventions    Readmission Risk Interventions No flowsheet data found.

## 2018-09-22 NOTE — Progress Notes (Signed)
[facility] [place] [facility]   PATIENT NAME: Lauren Roman    MR#:  [id number]  DATE OF BIRTH:  1929/03/22  SUBJECTIVE:   Patient is postoperative day #2 and doing well this morning.  No acute issues overnight.  REVIEW OF SYSTEMS:    Review of Systems  Constitutional: Negative for fever, chills weight loss HENT: Negative for ear pain, nosebleeds, congestion, facial swelling, rhinorrhea, neck pain, neck stiffness and ear discharge.   Respiratory: Negative for cough, shortness of breath, wheezing  Cardiovascular: Negative for chest pain, palpitations and leg swelling.  Gastrointestinal: Negative for heartburn, abdominal pain, vomiting, diarrhea or consitpation Genitourinary: Negative for dysuria, urgency, frequency, hematuria Musculoskeletal: Negative for back pain or joint pain Neurological: Negative for dizziness, seizures, syncope, focal weakness,  numbness and headaches.  Hematological: Does not bruise/bleed easily.  Psychiatric/Behavioral: Negative for hallucinations, confusion, dysphoric mood    Tolerating Diet: yes      DRUG ALLERGIES:   Allergies  Allergen Reactions  . Exelon [Rivastigmine Tartrate]     Patient unsure of reaction  . Other Itching and Rash    Anything metal on skin will cause rash and itching    VITALS:  Blood pressure (!) 120/54, pulse 89, temperature 99.6 F (37.6 C), temperature source Oral, resp. rate 18, height 5' (1.524 m), weight 53.8 kg, SpO2 98 %.  PHYSICAL EXAMINATION:  Constitutional: Appears well-developed and well-nourished. No distress. HENT: Normocephalic. Marland Kitchen. Oropharynx is clear and moist.  Eyes: Conjunctivae and EOM are normal. PERRLA, no scleral icterus.  Neck: Normal ROM. Neck supple. No JVD. No tracheal deviation. CVS: RRR, S1/S2 +, no murmurs, no gallops, no carotid bruit.  Pulmonary: Effort and breath sounds normal, no stridor, rhonchi, wheezes, rales.  Abdominal: Soft. BS +,  no distension,  tenderness, rebound or guarding.  Musculoskeletal: Normal range of motion. No edema and no tenderness.  Neuro: Alert. CN 2-12 grossly intact. No focal deficits. Skin: Skin is warm and dry. No rash noted. Dressing clean dry and intact no drainage Psychiatric: Normal mood and affect.      LABORATORY PANEL:   CBC Recent Labs  Lab 09/22/18 0438  WBC 16.3*  HGB 10.2*  HCT 30.5*  PLT 203   ------------------------------------------------------------------------------------------------------------------  Chemistries  Recent Labs  Lab 09/22/18 0438  NA 134*  K 3.7  CL 104  CO2 24  GLUCOSE 99  BUN 12  CREATININE 0.60  CALCIUM 8.0*   ------------------------------------------------------------------------------------------------------------------  Cardiac Enzymes No results for input(s): TROPONINI in the last 168 hours. ------------------------------------------------------------------------------------------------------------------  RADIOLOGY:  No results found.   ASSESSMENT AND PLAN:   85 year old female with myasthenia gravis who presented to the ER with left hip pain after mechanical fall.  1.  Left hip fracture: Patient is postoperative day #2 INTRAMEDULLARY (IM) NAIL INTERTROCHANTRIC (Left).  Continue PRN pain medications.  Appreciate orthopedic surgery consultation. DVT prophylaxis with Lovenox. Weightbearing as tolerated to left leg. PT consultation is recommending skilled nursing facility upon discharge.  Family and patient is agreeable.  Plan for discharge tomorrow to skilled nursing facility.   2.  History of myasthenia gravis: Patient is stable   3.  Hx of tremor: Continue Inderal 4.  Leukocytosis which is a stress response from fall and acute fracture: There are no signs of infection.   Management plans discussed with the patient and she is in agreement.  CODE STATUS: full  TOTAL TIME TAKING CARE OF THIS PATIENT: 22 minutes.     POSSIBLE D/C  tomorrow DEPENDING ON  CLINICAL CONDITION.   Adrian Saran M.D on 09/22/2018 at 10:29 AM  Between 7am to 6pm - Pager - [phone] After 6pm go to [email or web address] - password [email or web address]  [facility]  Office  [phone]  CC: Primary care physician; Danella Penton, MD  Note: This dictation was prepared with Dragon dictation along with smaller phrase technology. Any transcriptional errors that result from this process are unintentional.

## 2018-09-22 NOTE — Progress Notes (Signed)
PT Cancellation Note  Patient Details Name: MAE CARDY MRN: [id number] DOB: 07/17/1928   Cancelled Treatment:    Reason Eval/Treat Not Completed: Other (comment) Chart reviewed. Nursing consulted. Patient with nursing on arrival and preparing to eat breakfast. Patient requiring pain medications prior to treatment. PT will follow up at a later time/date.  Sheria Lang PT, DPT [phone] 09/22/2018, 9:02 AM

## 2018-09-22 NOTE — Progress Notes (Signed)
Pt alert and oriented. Medicated for pain during the night. Surgical dressing remaining dry and intact. Iv infusing without difficulty. Voiding without difficulty.

## 2018-09-22 NOTE — NC FL2 (Signed)
[place] MEDICAID FL2 LEVEL OF CARE SCREENING TOOL     IDENTIFICATION  Patient Name: Lauren PickettDorothy G Roman Birthdate: [birth year] Sex: female Admission Date (Current Location): 09/19/2018  [place]   Facility and Address:  [facility], [address], [place], [place] [address]      Provider Number: [id number]  Attending Physician Name and Address:  Adrian SaranMody, Sital, MD  Relative Name and Phone Number:  Genella MechKathy York (Daughter/Caregiver) [phone], [phone], [phone]    Current Level of Care: Hospital Recommended Level of Care: Skilled Nursing Facility Prior Approval Number:    Date Approved/Denied:   PASRR Number: [id number] [id number]  Discharge Plan: SNF    Current Diagnoses: Patient Active Problem List   Diagnosis Date Noted  . Closed left hip fracture (HCC) 09/19/2018  . Lumbar radiculopathy 01/30/2018    Orientation RESPIRATION BLADDER Height & Weight     Self, Time, Situation, Place  Normal Continent Weight: 118 lb 9.7 oz (53.8 kg) Height:  5' (152.4 cm)  BEHAVIORAL SYMPTOMS/MOOD NEUROLOGICAL BOWEL NUTRITION STATUS      Continent    AMBULATORY STATUS COMMUNICATION OF NEEDS Skin   Extensive Assist Verbally Surgical wounds                       Personal Care Assistance Level of Assistance  Bathing, Feeding, Dressing Bathing Assistance: Limited assistance Feeding assistance: Independent Dressing Assistance: Limited assistance     Functional Limitations Info  Sight, Hearing, Speech Sight Info: Adequate Hearing Info: Adequate Speech Info: Adequate    SPECIAL CARE FACTORS FREQUENCY  PT (By licensed PT), OT (By licensed OT)     PT Frequency: 5X OT Frequency: 3X            Contractures Contractures Info: Not present    Additional Factors Info  Code Status, Allergies, Psychotropic Code Status Info: Full Allergies Info: Exelon Rivastigmine Tartrate, Other Psychotropic Info: Gabapentin          Current Medications (09/22/2018):  This is the current hospital active medication list Current Facility-Administered Medications  Medication Dose Route Frequency Provider Last Rate Last Dose  . 0.9 %  sodium chloride infusion   Intravenous Continuous Kennedy BuckerMenz, Michael, MD 75 mL/hr at 09/21/18 1131    . acetaminophen (TYLENOL) tablet 650 mg  650 mg Oral Q6H PRN Kennedy BuckerMenz, Michael, MD       Or  . acetaminophen (TYLENOL) suppository 650 mg  650 mg Rectal Q6H PRN Kennedy BuckerMenz, Michael, MD      . acetaminophen (TYLENOL) tablet 325-650 mg  325-650 mg Oral Q6H PRN Kennedy BuckerMenz, Michael, MD      . alum & mag hydroxide-simeth (MAALOX/MYLANTA) 200-200-20 MG/5ML suspension 30 mL  30 mL Oral Q4H PRN Kennedy BuckerMenz, Michael, MD      . bisacodyl (DULCOLAX) suppository 10 mg  10 mg Rectal Daily PRN Kennedy BuckerMenz, Michael, MD      . calcium carbonate (TUMS - dosed in mg elemental calcium) chewable tablet 200 mg of elemental calcium  200 mg of elemental calcium Oral Daily Pyreddy, Pavan, MD   200 mg of elemental calcium at 09/22/18 0944  . cholecalciferol (VITAMIN D3) tablet 1,000 Units  1,000 Units Oral Daily Ihor AustinPyreddy, Pavan, MD   1,000 Units at 09/22/18 0944  . docusate sodium (COLACE) capsule 100 mg  100 mg Oral BID Kennedy BuckerMenz, Michael, MD   100 mg at 09/22/18 0944  . enoxaparin (LOVENOX) injection 40 mg  40 mg Subcutaneous Q24H Ihor AustinPyreddy, Pavan, MD   40  mg at 09/21/18 2102  . gabapentin (NEURONTIN) capsule 300 mg  300 mg Oral TID Kennedy Bucker, MD   300 mg at 09/22/18 0944  . HYDROcodone-acetaminophen (NORCO) 7.5-325 MG per tablet 1-2 tablet  1-2 tablet Oral Q4H PRN Kennedy Bucker, MD   1 tablet at 09/21/18 1939  . HYDROcodone-acetaminophen (NORCO/VICODIN) 5-325 MG per tablet 1-2 tablet  1-2 tablet Oral Q4H PRN Kennedy Bucker, MD   1 tablet at 09/22/18 0944  . magnesium citrate solution 1 Bottle  1 Bottle Oral Once PRN Kennedy Bucker, MD      . magnesium hydroxide (MILK OF MAGNESIA) suspension 30 mL  30 mL Oral Daily PRN Kennedy Bucker, MD      .  menthol-cetylpyridinium (CEPACOL) lozenge 3 mg  1 lozenge Oral PRN Kennedy Bucker, MD       Or  . phenol (CHLORASEPTIC) mouth spray 1 spray  1 spray Mouth/Throat PRN Kennedy Bucker, MD      . methocarbamol (ROBAXIN) tablet 500 mg  500 mg Oral Q6H PRN Kennedy Bucker, MD   500 mg at 09/21/18 1729   Or  . methocarbamol (ROBAXIN) 500 mg in dextrose 5 % 50 mL IVPB  500 mg Intravenous Q6H PRN Kennedy Bucker, MD      . methocarbamol (ROBAXIN) tablet 500 mg  500 mg Oral Q6H PRN Kennedy Bucker, MD      . metoCLOPramide (REGLAN) tablet 5-10 mg  5-10 mg Oral Q8H PRN Kennedy Bucker, MD       Or  . metoCLOPramide (REGLAN) injection 5-10 mg  5-10 mg Intravenous Q8H PRN Kennedy Bucker, MD      . morphine 2 MG/ML injection 2 mg  2 mg Intravenous Q3H PRN Kennedy Bucker, MD   2 mg at 09/19/18 1720  . ondansetron (ZOFRAN) tablet 4 mg  4 mg Oral Q6H PRN Kennedy Bucker, MD       Or  . ondansetron [facility]) injection 4 mg  4 mg Intravenous Q6H PRN Kennedy Bucker, MD   4 mg at 09/19/18 1720  . pantoprazole (PROTONIX) EC tablet 40 mg  40 mg Oral Daily Kennedy Bucker, MD   40 mg at 09/22/18 0944  . polyethylene glycol (MIRALAX / GLYCOLAX) packet 17 g  17 g Oral Daily PRN Kennedy Bucker, MD      . predniSONE (DELTASONE) tablet 5 mg  5 mg Oral Daily Kennedy Bucker, MD   5 mg at 09/22/18 0945  . propranolol (INDERAL) tablet 20 mg  20 mg Oral Daily Kennedy Bucker, MD      . traMADol Janean Sark) tablet 50 mg  50 mg Oral Q4H PRN Kennedy Bucker, MD   50 mg at 09/20/18 1047  . traMADol (ULTRAM) tablet 50 mg  50 mg Oral Q6H Kennedy Bucker, MD   50 mg at 09/22/18 0506  . vitamin B-12 (CYANOCOBALAMIN) tablet 1,000 mcg  1,000 mcg Oral Daily Ihor Austin, MD   1,000 mcg at 09/22/18 0944  . zolpidem (AMBIEN) tablet 5 mg  5 mg Oral QHS PRN Kennedy Bucker, MD         Discharge Medications: Please see discharge summary for a list of discharge medications.  Relevant Imaging Results:  Relevant Lab Results:   Additional  Information SS#[id number]  Judi Cong, LCSW

## 2018-09-22 NOTE — Progress Notes (Signed)
   Subjective: 2 Days Post-Op Procedure(s) (LRB): INTRAMEDULLARY (IM) NAIL INTERTROCHANTRIC (Left) Patient reports pain as mild.  Patient is well, and has had no acute complaints or problems Denies any CP, SOB, ABD pain. We will continue therapy today.   Objective: Vital signs in last 24 hours: Temp:  [97.5 F (36.4 C)-100.2 F (37.9 C)] 99.6 F (37.6 C) (05/10 0436) Pulse Rate:  [84-94] 89 (05/10 0436) Resp:  [16-18] 18 (05/10 0436) BP: (108-124)/(44-54) 120/54 (05/10 0436) SpO2:  [90 %-98 %] 98 % (05/10 0436)  Intake/Output from previous day: 05/09 0701 - 05/10 0700 In: 1795.4 [P.O.:600; I.V.:1195.4] Out: 750 [Urine:750] Intake/Output this shift: No intake/output data recorded.  Recent Labs    09/19/18 1323 09/20/18 0352 09/21/18 0459 09/22/18 0438  HGB 12.8 11.7* 10.5* 10.2*   Recent Labs    09/21/18 0459 09/22/18 0438  WBC 14.9* 16.3*  RBC 3.39* 3.28*  HCT 32.0* 30.5*  PLT 214 203   Recent Labs    09/21/18 0459 09/22/18 0438  NA 136 134*  K 3.8 3.7  CL 105 104  CO2 25 24  BUN 11 12  CREATININE 0.69 0.60  GLUCOSE 95 99  CALCIUM 8.2* 8.0*   Recent Labs    09/19/18 1323  INR 1.0    EXAM General - Patient is Alert, Appropriate and Oriented Extremity - Neurovascular intact Sensation intact distally Intact pulses distally Dorsiflexion/Plantar flexion intact No cellulitis present Compartment soft Dressing - dressing C/D/I and no drainage Motor Function - intact, moving foot and toes well on exam.   Past Medical History:  Diagnosis Date  . Arthritis   . Dementia (HCC)    mild  . Headache    migraines  . Hepatitis 1952   hep b-no problems since then  . Hyperlipidemia   . Myasthenia gravis (HCC)   . Tremor    right hand    Assessment/Plan:   2 Days Post-Op Procedure(s) (LRB): INTRAMEDULLARY (IM) NAIL INTERTROCHANTRIC (Left) Active Problems:   Closed left hip fracture (HCC)  Estimated body mass index is 23.16 kg/m as calculated  from the following:   Height as of this encounter: 5' (1.524 m).   Weight as of this encounter: 53.8 kg. Advance diet Up with therapy, WBAT Vital signs stable Labs stable Patient and family prefer to go home with home health PT.  Will monitor progress with PT.  Lovenox 40 mg subcu daily x14 days at discharge TED hose bilateral lower extremities x2 weeks Follow-up with [facility] orthopedics in 2 weeks Prescriptions for Lovenox and pain medication in chart   DVT Prophylaxis - Lovenox, TED hose and SCDs Weight-Bearing as tolerated to left leg   T. Cranston Neighbor, PA-C [facility] 09/22/2018, 7:32 AM

## 2018-09-22 NOTE — Progress Notes (Signed)
Physical Therapy Treatment Patient Details Name: Lauren Roman MRN: [id number] DOB: Feb 18, 1929 Today's Date: 09/22/2018    History of Present Illness Patient is a pleasant 85 year old female who presented to ED s/p fall onto L hip. Imaging shows an acute displaced intertrochanteric fracture of L hip and surgery on 09/20/18 for ORIF was performed. Patient's history limited due to dementia. PMH includes arthritis, dementia, HA, Hepatitisu, hyperlipidemia, myasthenia gravis, and tremor (right hand).     PT Comments    Patient in bed on phone on PT arrival, but eager to participate in therapy. Patient able to self-assist LLE with towel sling for supine to sit bed mobility with Mod A for trunk support/movement. Patient requires mod VCs for hand placement and sequencing of bed mobility. Patient able to maintain sitting EOB with intermittent UE support, SBA/CGA for > 3 min with no signs of unsteadiness. Patient performed 4x STS with RW, Mod A, max VCs for hand placement and safety and tactile cues for hip extension and posture. Of note, patient demonstrates exceptional control when descending to seat surface on all trials. Patient attempted stand pivot bilaterally with the R being most successful and requiring the least assistance/management of RW, Mod A. Patient quite fatigued afterward 2/2 to both physical effort and extreme fear of movement. Patient's HR and SpO2 responded appropriately. Patient on 1L O2 via [place] during activity and SpO2 maintained >94%. HR at max effort for bed mobility and transfers was 106 bpm but recovered to 89 bpm at end of session. Patient will continue to benefit from skilled therapeutic intervention to address deficits in strength, mobility, and balance for improved overall QOL and return to PLOF.  Patient in chair, alarm set, call bell/phone in reach. All needs met.     Follow Up Recommendations  SNF     Equipment Recommendations  None recommended by PT    Recommendations  for Other Services       Precautions / Restrictions Precautions Precautions: Fall Restrictions Weight Bearing Restrictions: Yes LLE Weight Bearing: Weight bearing as tolerated    Mobility  Bed Mobility Overal bed mobility: Needs Assistance Bed Mobility: Supine to Sit     Supine to sit: Mod assist;HOB elevated     General bed mobility comments: Patient requires additional time and cueing to sequence. Patient performed self-assisted movement of LLE. Patient requires Mod A to maintain posture initially, but able to sustain slumped posture without support once at EOB with feet supported.  Transfers Overall transfer level: Needs assistance Equipment used: Rolling walker (2 wheeled) Transfers: Sit to/from [facility] Sit to Stand: Mod assist Stand pivot transfers: Mod assist       General transfer comment: Patient requires max VCs for hand placement and safety. Patient benefits from tactile cues for muscle activation for hip extension and weight shifting. Patient extremely fearful, yet motivated.  Ambulation/Gait                 Stairs             Wheelchair Mobility    Modified Rankin (Stroke Patients Only)       Balance Overall balance assessment: Needs assistance;History of Falls Sitting-balance support: Feet supported;Single extremity supported;No upper extremity supported Sitting balance-Leahy Scale: Poor Sitting balance - Comments: Patient demonstrated ability to maintain sitting EOB with feet supported without UE support for short duration. Patient's posture is kyphotic, but she had no indication of unsteadiness when without UE support although only short duration. Postural control: Right lateral  lean Standing balance support: Bilateral upper extremity supported Standing balance-Leahy Scale: Poor Standing balance comment: Patient requires heavy use of UE's due to fear of LOB and pain in LLE.                              Cognition Arousal/Alertness: Awake/alert Behavior During Therapy: WFL for tasks assessed/performed Overall Cognitive Status: Within Functional Limits for tasks assessed                                        Exercises Total Joint Exercises Ankle Circles/Pumps: Both;10 reps;Strengthening Gluteal Sets: Strengthening;Both;10 reps Heel Slides: Strengthening;Both;10 reps Hip ABduction/ADduction: Strengthening;Both;15 reps Other Exercises Other Exercises: Functional transfers: STS x4 with max VCs for hand placement/safety, tactile cues for hip extension, Mod A; Stand pivot (L): RW, Mod/Max A, Mod A for RW management; (R) RW, Mod A     General Comments        Pertinent Vitals/Pain Pain Assessment: 0-10 Pain Score: 8  Pain Location: L hip  Pain Descriptors / Indicators: Aching Pain Intervention(s): Limited activity within patient's tolerance;Repositioned;Monitored during session;Premedicated before session    Home Living                      Prior Function            PT Goals (current goals can now be found in the care plan section) Acute Rehab PT Goals Patient Stated Goal: to walk better  PT Goal Formulation: With patient Time For Goal Achievement: 10/05/18 Potential to Achieve Goals: Fair Progress towards PT goals: Progressing toward goals    Frequency    BID      PT Plan Current plan remains appropriate    Co-evaluation              AM-PAC PT "6 Clicks" Mobility   Outcome Measure  Help needed turning from your back to your side while in a flat bed without using bedrails?: A Lot Help needed moving from lying on your back to sitting on the side of a flat bed without using bedrails?: A Lot Help needed moving to and from a bed to a chair (including a wheelchair)?: A Lot Help needed standing up from a chair using your arms (e.g., wheelchair or bedside chair)?: A Lot Help needed to walk in hospital room?: A Lot Help needed climbing 3-5  steps with a railing? : A Lot 6 Click Score: 12    End of Session Equipment Utilized During Treatment: Gait belt;Oxygen(1 L 02 via nasal cannula ) Activity Tolerance: Patient limited by pain Patient left: in chair;with chair alarm set;with call bell/phone within reach Nurse Communication: Mobility status(Mod A and increased time for SPT) PT Visit Diagnosis: Unsteadiness on feet (R26.81);Other abnormalities of gait and mobility (R26.89);Muscle weakness (generalized) (M62.81);History of falling (Z91.81);Difficulty in walking, not elsewhere classified (R26.2);Pain Pain - Right/Left: Left Pain - part of body: Hip     Time: 2023-3435 PT Time Calculation (min) (ACUTE ONLY): 57 min  Charges:  $Therapeutic Exercise: 8-22 mins $Therapeutic Activity: 38-52 mins                     Myles Gip PT, DPT [phone] 09/22/2018, 3:42 PM

## 2018-09-23 NOTE — TOC Transition Note (Signed)
Transition of Care [facility]) - CM/SW Discharge Note   Patient Details  Name: Lauren Roman MRN: [id number] Date of Birth: [birth year]  Transition of Care [facility]) CM/SW Contact:  Ruthe Mannan, LCSWA Phone Number: 09/23/2018, 12:08 PM   Clinical Narrative:  Patient is medically ready for discharge today. CSW spoke with patient's daughter Genella Mech and she chose bed at [facility]. CSW notified Verlon Au at [facility] that patient would be discharged today. Patient will be transported by EMS. RN to call report and call for transport.      Final next level of care: Skilled Nursing Facility Barriers to Discharge: No Barriers Identified   Patient Goals and CMS Choice Patient states their goals for this hospitalization and ongoing recovery are:: to go home CMS Medicare.gov Compare Post Acute Care list provided to:: Patient Represenative (must comment)(Daughter ) Choice offered to / list presented to : Adult Children  Discharge Placement   Existing PASRR number confirmed : 09/22/18          Patient chooses bed at: [facility] Patient to be transferred to facility by: EMS Name of family member notified: Daughter- Genella Mech  Patient and family notified of of transfer: 09/23/18  Discharge Plan and Services   Discharge Planning Services: CM Consult Post Acute Care Choice: Home Health                    HH Arranged: PT, Nurse's Aide [facility] Agency: [facility] Date HH Agency Contacted: 09/20/18 Time HH Agency Contacted: 1116 Representative spoke with at [facility] Agency: Barbara Cower  Social Determinants of Health (SDOH) Interventions     Readmission Risk Interventions No flowsheet data found.

## 2018-09-23 NOTE — Care Management Important Message (Signed)
Important Message  Patient Details  Name: Lauren Roman MRN: [id number] Date of Birth: 07/30/1928   Medicare Important Message Given:  Yes    [name] 09/23/2018, 10:33 AM

## 2018-09-23 NOTE — Discharge Summary (Signed)
[facility] [place] [facility]   PATIENT NAME: Lauren Roman    MR#:  [id number]  DATE OF BIRTH:  Feb 18, 1929  DATE OF ADMISSION:  09/19/2018 ADMITTING PHYSICIAN: Campbell Stall, MD  DATE OF DISCHARGE: 09/23/2018  PRIMARY CARE PHYSICIAN: Danella Penton, MD    ADMISSION DIAGNOSIS:  Closed fracture dislocation of left hip joint, initial encounter (HCC) [S72.002A] Dementia without behavioral disturbance, unspecified dementia type (HCC) [F03.90]  DISCHARGE DIAGNOSIS:  Active Problems:   Closed left hip fracture (HCC)   SECONDARY DIAGNOSIS:   Past Medical History:  Diagnosis Date  . Arthritis   . Dementia (HCC)    mild  . Headache    migraines  . Hepatitis 1952   hep b-no problems since then  . Hyperlipidemia   . Myasthenia gravis (HCC)   . Tremor    right hand    HOSPITAL COURSE:   85 year old female with myasthenia gravis who presented to the ER with left hip pain after mechanical fall.  1.  Left hip fracture: Patient is postoperative day #3 INTRAMEDULLARY (IM) NAIL INTERTROCHANTRIC (Left).  Continue PRN pain medications.  Appreciate orthopedic surgery consultation. DVT prophylaxis with Lovenox for 14 days post-op. Weightbearing as tolerated to left leg.   2.  History of myasthenia gravis: Paitient not having any issues.   3.  Hx of tremor: Continue Inderal 4.  Leukocytosis which is a stress response from fall and acute fracture: There are no signs of infection. 5. Acute hypoxic respiratory failure from atelectasis: Wean oxygen to room air as tolerated.  Continue to encourage use of incentive spirometer.  DISCHARGE CONDITIONS AND DIET:   Stable for discharge regular diet  CONSULTS OBTAINED:  Treatment Team:  Kennedy Bucker, MD  DRUG ALLERGIES:   Allergies  Allergen Reactions  . Exelon [Rivastigmine Tartrate]     Patient unsure of reaction  . Other Itching and Rash    Anything metal on skin will cause rash and itching     DISCHARGE MEDICATIONS:   Allergies as of 09/23/2018      Reactions   Exelon [rivastigmine Tartrate]    Patient unsure of reaction   Other Itching, Rash   Anything metal on skin will cause rash and itching      Medication List    STOP taking these medications   methocarbamol 500 MG tablet Commonly known as:  ROBAXIN   ondansetron 4 MG disintegrating tablet Commonly known as:  Zofran ODT   traMADol 50 MG tablet Commonly known as:  ULTRAM     TAKE these medications   enoxaparin 40 MG/0.4ML injection Commonly known as:  LOVENOX Inject 0.4 mLs (40 mg total) into the skin daily for 14 days.   gabapentin 300 MG capsule Commonly known as:  NEURONTIN Take 300 mg by mouth 3 (three) times daily.   HYDROcodone-acetaminophen 5-325 MG tablet Commonly known as:  NORCO/VICODIN Take 1-2 tablets by mouth every 4 (four) hours as needed for moderate pain (pain score 4-6).   omeprazole 40 MG capsule Commonly known as:  PRILOSEC Take 40 mg by mouth daily.   predniSONE 5 MG tablet Commonly known as:  DELTASONE Take 5 mg by mouth daily.   propranolol 20 MG tablet Commonly known as:  INDERAL Take 20 mg by mouth daily.   vitamin B-12 1000 MCG tablet Commonly known as:  CYANOCOBALAMIN Take 1,000 mcg by mouth daily.   Vitamin D-1000 Max St 25 MCG (1000 UT) tablet Generic drug:  Cholecalciferol Take 1,000 Units  by mouth daily.         Today   CHIEF COMPLAINT:  Doing well this morning no shortness of breath, chest pain or pain from surgery.   VITAL SIGNS:  Blood pressure (!) 157/66, pulse 82, temperature (!) 97.4 F (36.3 C), temperature source Oral, resp. rate 16, height 5' (1.524 m), weight 53.8 kg, SpO2 98 %.   REVIEW OF SYSTEMS:  Review of Systems  Constitutional: Negative.  Negative for chills, fever and malaise/fatigue.  HENT: Negative.  Negative for ear discharge, ear pain, hearing loss, nosebleeds and sore throat.   Eyes: Negative.  Negative for blurred  vision and pain.  Respiratory: Negative.  Negative for cough, hemoptysis, shortness of breath and wheezing.   Cardiovascular: Negative.  Negative for chest pain, palpitations and leg swelling.  Gastrointestinal: Negative.  Negative for abdominal pain, blood in stool, diarrhea, nausea and vomiting.  Genitourinary: Negative.  Negative for dysuria.  Musculoskeletal: Negative.  Negative for back pain.  Skin: Negative.   Neurological: Negative for dizziness, tremors, speech change, focal weakness, seizures and headaches.  Endo/Heme/Allergies: Negative.  Does not bruise/bleed easily.  Psychiatric/Behavioral: Negative.  Negative for depression, hallucinations and suicidal ideas.     PHYSICAL EXAMINATION:  GENERAL:  85 y.o.-year-old patient lying in the bed with no acute distress.  NECK:  Supple, no jugular venous distention. No thyroid enlargement, no tenderness.  LUNGS: Normal breath sounds bilaterally, no wheezing, rales,rhonchi  No use of accessory muscles of respiration.  CARDIOVASCULAR: S1, S2 normal. No murmurs, rubs, or gallops.  ABDOMEN: Soft, non-tender, non-distended. Bowel sounds present. No organomegaly or mass.  EXTREMITIES: No pedal edema, cyanosis, or clubbing.  PSYCHIATRIC: The patient is alert and oriented x 3.  SKIN: No obvious rash, lesion, or ulcer.   DATA REVIEW:   CBC Recent Labs  Lab 09/22/18 0438  WBC 16.3*  HGB 10.2*  HCT 30.5*  PLT 203    Chemistries  Recent Labs  Lab 09/22/18 0438  NA 134*  K 3.7  CL 104  CO2 24  GLUCOSE 99  BUN 12  CREATININE 0.60  CALCIUM 8.0*    Cardiac Enzymes No results for input(s): TROPONINI in the last 168 hours.  Microbiology Results  @MICRORSLT48 @  RADIOLOGY:  Dg Chest 2 View  Result Date: 09/22/2018 CLINICAL DATA:  Postop fever, smoker left hip fracture fixation EXAM: CHEST - 2 VIEW COMPARISON:  09/19/2018 FINDINGS: Mild cardiomegaly without edema, CHF, effusion or pneumothorax. Slightly lower lung volumes with  increased mild atelectasis and persistent elevation of the right hemidiaphragm. Trachea midline. Aorta atherosclerotic. Degenerative changes of the spine. Bones are osteopenic. IMPRESSION: Slight decreased lung volumes with increased basilar atelectasis. Stable cardiomegaly Electronically Signed   By: Judie PetitM.  Shick M.D.   On: 09/22/2018 12:28      Allergies as of 09/23/2018      Reactions   Exelon [rivastigmine Tartrate]    Patient unsure of reaction   Other Itching, Rash   Anything metal on skin will cause rash and itching      Medication List    STOP taking these medications   methocarbamol 500 MG tablet Commonly known as:  ROBAXIN   ondansetron 4 MG disintegrating tablet Commonly known as:  Zofran ODT   traMADol 50 MG tablet Commonly known as:  ULTRAM     TAKE these medications   enoxaparin 40 MG/0.4ML injection Commonly known as:  LOVENOX Inject 0.4 mLs (40 mg total) into the skin daily for 14 days.   gabapentin 300 MG  capsule Commonly known as:  NEURONTIN Take 300 mg by mouth 3 (three) times daily.   HYDROcodone-acetaminophen 5-325 MG tablet Commonly known as:  NORCO/VICODIN Take 1-2 tablets by mouth every 4 (four) hours as needed for moderate pain (pain score 4-6).   omeprazole 40 MG capsule Commonly known as:  PRILOSEC Take 40 mg by mouth daily.   predniSONE 5 MG tablet Commonly known as:  DELTASONE Take 5 mg by mouth daily.   propranolol 20 MG tablet Commonly known as:  INDERAL Take 20 mg by mouth daily.   vitamin B-12 1000 MCG tablet Commonly known as:  CYANOCOBALAMIN Take 1,000 mcg by mouth daily.   Vitamin D-1000 Max St 25 MCG (1000 UT) tablet Generic drug:  Cholecalciferol Take 1,000 Units by mouth daily.          Management plans discussed with the patient and she is in agreement. Stable for discharge snf  Patient should follow up with ortho  CODE STATUS:     Code Status Orders  (From admission, onward)         Start     Ordered    09/19/18 1750  Full code  Continuous     09/19/18 1749        Code Status History    Date Active Date Inactive Code Status Order ID Comments User Context   01/30/2018 1959 01/31/2018 2237 Full Code [id number]  Ivar Drape, PA-C Inpatient      TOTAL TIME TAKING CARE OF THIS PATIENT: 38 minutes.    Note: This dictation was prepared with Dragon dictation along with smaller phrase technology. Any transcriptional errors that result from this process are unintentional.  Adrian Saran M.D on 09/23/2018 at 8:45 AM  Between 7am to 6pm - Pager - [phone] After 6pm go to [email or web address] - Social research officer, government  [facility]  Office  [phone]  CC: Primary care physician; Danella Penton, MD

## 2018-09-23 NOTE — Progress Notes (Signed)
   Subjective: 3 Days Post-Op Procedure(s) (LRB): INTRAMEDULLARY (IM) NAIL INTERTROCHANTRIC (Left) Patient reports pain as mild.  Patient is well, and has had no acute complaints or problems Denies any CP, SOB, ABD pain. We will continue therapy today.   Objective: Vital signs in last 24 hours: Temp:  [98.2 F (36.8 C)-98.9 F (37.2 C)] 98.7 F (37.1 C) (05/11 0415) Pulse Rate:  [80-88] 80 (05/11 0415) Resp:  [17] 17 (05/11 0415) BP: (118-145)/(57-59) 145/57 (05/11 0415) SpO2:  [93 %-100 %] 100 % (05/11 0415)  Intake/Output from previous day: 05/10 0701 - 05/11 0700 In: 464 [P.O.:360; I.V.:104] Out: 301 [Urine:300; Stool:1] Intake/Output this shift: No intake/output data recorded.  Recent Labs    09/21/18 0459 09/22/18 0438  HGB 10.5* 10.2*   Recent Labs    09/21/18 0459 09/22/18 0438  WBC 14.9* 16.3*  RBC 3.39* 3.28*  HCT 32.0* 30.5*  PLT 214 203   Recent Labs    09/21/18 0459 09/22/18 0438  NA 136 134*  K 3.8 3.7  CL 105 104  CO2 25 24  BUN 11 12  CREATININE 0.69 0.60  GLUCOSE 95 99  CALCIUM 8.2* 8.0*   No results for input(s): LABPT, INR in the last 72 hours.  EXAM General - Patient is Alert, Appropriate and Oriented Extremity - Neurovascular intact Sensation intact distally Intact pulses distally Dorsiflexion/Plantar flexion intact No cellulitis present Compartment soft Dressing - dressing C/D/I and no drainage Motor Function - intact, moving foot and toes well on exam.   Past Medical History:  Diagnosis Date  . Arthritis   . Dementia (HCC)    mild  . Headache    migraines  . Hepatitis 1952   hep b-no problems since then  . Hyperlipidemia   . Myasthenia gravis (HCC)   . Tremor    right hand    Assessment/Plan:   3 Days Post-Op Procedure(s) (LRB): INTRAMEDULLARY (IM) NAIL INTERTROCHANTRIC (Left) Active Problems:   Closed left hip fracture (HCC)  Estimated body mass index is 23.16 kg/m as calculated from the following:  Height as of this encounter: 5' (1.524 m).   Weight as of this encounter: 53.8 kg. Advance diet Up with therapy, WBAT Vital signs stable Labs stable Per CM, patient to discharge to SNF.   Lovenox 40 mg subcu daily x14 days at discharge TED hose bilateral lower extremities x2 weeks Follow-up with [facility] orthopedics in 2 weeks Prescriptions for Lovenox and pain medication in chart   DVT Prophylaxis - Lovenox, TED hose and SCDs Weight-Bearing as tolerated to left leg   T. Cranston Neighbor, PA-C [facility] 09/23/2018, 7:52 AM

## 2018-09-23 NOTE — Progress Notes (Signed)
Called report to [place], LPN at [facility]. Answered all questions. EMS to transport

## 2018-09-23 NOTE — Progress Notes (Signed)
PT Cancellation Note  Patient Details Name: ADASSA NEIDHART MRN: [id number] DOB: [birth year]   Cancelled Treatment:    Reason Eval/Treat Not Completed: Pain limiting ability to participate Chart reviewed. Patient in bed practicing incentive spirometry on PT arrival with nursing present shortly after to administer medications. Patient's current pain level 8/10; nursing providing pain medication at this time. PT will follow-up at a later time/date when pain medication has taken effect.  Sheria Lang PT, DPT [phone] 09/23/2018, 11:14 AM

## 2018-09-23 NOTE — Progress Notes (Signed)
Physical Therapy Treatment Patient Details Name: Lauren Roman MRN: [id number] DOB: [birth year] Today's Date: 09/23/2018    History of Present Illness Patient is a pleasant 85 year old female who presented to ED s/p fall onto L hip. Imaging shows an acute displaced intertrochanteric fracture of L hip and surgery on 09/20/18 for ORIF was performed. Patient's history limited due to dementia. PMH includes arthritis, dementia, HA, Hepatitisu, hyperlipidemia, myasthenia gravis, and tremor (right hand).     PT Comments    Patient supine in bed resting on PT arrival, but eager to participate in therapy. Patient continues to report high level pain (8/10) and reports limited but significant improvement with pain medication (6/10). Patient educated on the benefits of movement to address some of the pain and stiffness, and agrees "doing my exercises is going to get me better quicker." Patient demonstrates improving AROM on LLE as evidenced by ability to perform SLR with leg clearance from the mattress. Patient's combine hip and knee flexion in supine is much improved with no complaint of pain during activity and tolerating moderate manual resistance during combined hip and knee extension. Patient will continue to benefit from skilled therapeutic intervention to address deficits in strength, mobility, balance, and activity tolerance for decreased risk of falls and improved level of function.  Patient in bed with all needs in reach/met.   Follow Up Recommendations  SNF     Equipment Recommendations  None recommended by PT    Recommendations for Other Services       Precautions / Restrictions Precautions Precautions: Fall Restrictions Weight Bearing Restrictions: Yes LLE Weight Bearing: Weight bearing as tolerated    Mobility  Bed Mobility                  Transfers                    Ambulation/Gait                 Stairs             Wheelchair Mobility     Modified Rankin (Stroke Patients Only)       Balance                                            Cognition Arousal/Alertness: Awake/alert Behavior During Therapy: WFL for tasks assessed/performed Overall Cognitive Status: Within Functional Limits for tasks assessed                                        Exercises      General Comments        Pertinent Vitals/Pain Pain Assessment: 0-10 Pain Score: 8  Pain Location: L hip  Pain Descriptors / Indicators: Aching Pain Intervention(s): Limited activity within patient's tolerance;Monitored during session;Repositioned    Home Living                      Prior Function            PT Goals (current goals can now be found in the care plan section) Acute Rehab PT Goals Patient Stated Goal: to walk better  PT Goal Formulation: With patient Time For Goal Achievement: 10/05/18 Potential to Achieve Goals: Fair Progress towards PT goals: Progressing toward  goals    Frequency    BID      PT Plan Current plan remains appropriate    Co-evaluation              AM-PAC PT "6 Clicks" Mobility   Outcome Measure  Help needed turning from your back to your side while in a flat bed without using bedrails?: A Lot Help needed moving from lying on your back to sitting on the side of a flat bed without using bedrails?: A Lot Help needed moving to and from a bed to a chair (including a wheelchair)?: A Lot Help needed standing up from a chair using your arms (e.g., wheelchair or bedside chair)?: A Lot Help needed to walk in hospital room?: A Lot Help needed climbing 3-5 steps with a railing? : A Lot 6 Click Score: 12    End of Session Equipment Utilized During Treatment: Oxygen(1 L 02 via nasal cannula ) Activity Tolerance: Patient tolerated treatment well Patient left: with call bell/phone within reach;in bed;with bed alarm set Nurse Communication: Mobility status(Mod A and  increased time for SPT) PT Visit Diagnosis: Unsteadiness on feet (R26.81);Other abnormalities of gait and mobility (R26.89);Muscle weakness (generalized) (M62.81);History of falling (Z91.81);Difficulty in walking, not elsewhere classified (R26.2);Pain Pain - Right/Left: Left Pain - part of body: Hip     Time: 5461-2432 PT Time Calculation (min) (ACUTE ONLY): 31 min  Charges:  $Therapeutic Exercise: 23-37 mins                     Myles Gip PT, DPT [phone] 09/23/2018, 1:49 PM

## 2018-09-23 NOTE — Progress Notes (Signed)
PT Cancellation Note  Patient Details Name: Lauren Roman MRN: [id number] DOB: 07/12/1928   Cancelled Treatment:    Reason Eval/Treat Not Completed: Other (comment) Chart reviewed. Patient participating in bathing/hygiene on PT arrival to the floor. PT will follow-up at a later time/date.  Sheria Lang PT, DPT [phone] 09/23/2018, 9:48 AM

## 2018-11-01 ENCOUNTER — Inpatient Hospital Stay
Admission: EM | Admit: 2018-11-01 | Discharge: 2018-11-04 | DRG: 195 | Disposition: A | Discharge status: Skilled Nursing Facility | Payer: Medicare Other | Attending: Family Medicine | Admitting: Family Medicine

## 2018-11-01 ENCOUNTER — Encounter: Payer: Self-pay | Admitting: Emergency Medicine

## 2018-11-01 ENCOUNTER — Other Ambulatory Visit: Payer: Self-pay

## 2018-11-01 ENCOUNTER — Emergency Department: Payer: Medicare Other

## 2018-11-01 DIAGNOSIS — Z20828 Contact with and (suspected) exposure to other viral communicable diseases: Secondary | ICD-10-CM | POA: Diagnosis present

## 2018-11-01 DIAGNOSIS — M479 Spondylosis, unspecified: Secondary | ICD-10-CM | POA: Diagnosis present

## 2018-11-01 DIAGNOSIS — M549 Dorsalgia, unspecified: Secondary | ICD-10-CM | POA: Diagnosis present

## 2018-11-01 DIAGNOSIS — J189 Pneumonia, unspecified organism: Secondary | ICD-10-CM | POA: Diagnosis present

## 2018-11-01 DIAGNOSIS — Y95 Nosocomial condition: Secondary | ICD-10-CM | POA: Diagnosis present

## 2018-11-01 DIAGNOSIS — G8929 Other chronic pain: Secondary | ICD-10-CM | POA: Diagnosis present

## 2018-11-01 DIAGNOSIS — T402X5A Adverse effect of other opioids, initial encounter: Secondary | ICD-10-CM | POA: Diagnosis present

## 2018-11-01 DIAGNOSIS — G7000 Myasthenia gravis without (acute) exacerbation: Secondary | ICD-10-CM | POA: Diagnosis present

## 2018-11-01 DIAGNOSIS — E785 Hyperlipidemia, unspecified: Secondary | ICD-10-CM | POA: Diagnosis present

## 2018-11-01 DIAGNOSIS — K5903 Drug induced constipation: Secondary | ICD-10-CM | POA: Diagnosis present

## 2018-11-01 DIAGNOSIS — Z87891 Personal history of nicotine dependence: Secondary | ICD-10-CM | POA: Diagnosis not present

## 2018-11-01 DIAGNOSIS — E860 Dehydration: Secondary | ICD-10-CM | POA: Diagnosis present

## 2018-11-01 DIAGNOSIS — Z803 Family history of malignant neoplasm of breast: Secondary | ICD-10-CM

## 2018-11-01 DIAGNOSIS — F0390 Unspecified dementia without behavioral disturbance: Secondary | ICD-10-CM | POA: Diagnosis present

## 2018-11-01 DIAGNOSIS — R531 Weakness: Secondary | ICD-10-CM | POA: Diagnosis present

## 2018-11-01 DIAGNOSIS — Z7952 Long term (current) use of systemic steroids: Secondary | ICD-10-CM

## 2018-11-01 LAB — URINALYSIS, COMPLETE (UACMP) WITH MICROSCOPIC
Bacteria, UA: NONE SEEN
Bilirubin Urine: NEGATIVE
Glucose, UA: NEGATIVE mg/dL
Hgb urine dipstick: NEGATIVE
Ketones, ur: NEGATIVE mg/dL
Leukocytes,Ua: NEGATIVE
Nitrite: NEGATIVE
Protein, ur: NEGATIVE mg/dL
Specific Gravity, Urine: 1.018 (ref 1.005–1.030)
pH: 6 (ref 5.0–8.0)

## 2018-11-01 LAB — MRSA PCR SCREENING: MRSA by PCR: NEGATIVE

## 2018-11-01 LAB — CBC
HCT: 38.1 % (ref 36.0–46.0)
Hemoglobin: 12.4 g/dL (ref 12.0–15.0)
MCH: 31.5 pg (ref 26.0–34.0)
MCHC: 32.5 g/dL (ref 30.0–36.0)
MCV: 96.7 fL (ref 80.0–100.0)
Platelets: 185 10*3/uL (ref 150–400)
RBC: 3.94 MIL/uL (ref 3.87–5.11)
RDW: 13.7 % (ref 11.5–15.5)
WBC: 13 10*3/uL — ABNORMAL HIGH (ref 4.0–10.5)
nRBC: 0 % (ref 0.0–0.2)

## 2018-11-01 LAB — SARS CORONAVIRUS 2 BY RT PCR (HOSPITAL ORDER, PERFORMED IN ~~LOC~~ HOSPITAL LAB): SARS Coronavirus 2: NEGATIVE

## 2018-11-01 LAB — BASIC METABOLIC PANEL
Anion gap: 9 (ref 5–15)
BUN: 28 mg/dL — ABNORMAL HIGH (ref 8–23)
CO2: 23 mmol/L (ref 22–32)
Calcium: 9 mg/dL (ref 8.9–10.3)
Chloride: 103 mmol/L (ref 98–111)
Creatinine, Ser: 0.7 mg/dL (ref 0.44–1.00)
GFR calc Af Amer: >60 mL/min (ref >60)
GFR calc non Af Amer: >60 mL/min (ref >60)
Glucose, Bld: 112 mg/dL — ABNORMAL HIGH (ref 70–99)
Potassium: 4 mmol/L (ref 3.5–5.1)
Sodium: 135 mmol/L (ref 135–145)

## 2018-11-01 MED ORDER — PROPRANOLOL HCL 20 MG PO TABS
20.0000 mg | ORAL_TABLET | Freq: Every day | ORAL | Status: DC
  Administered 2018-11-02 – 2018-11-03 (×2): 20 mg via ORAL
  Filled 2018-11-01 (×3): qty 1

## 2018-11-01 MED ORDER — PANTOPRAZOLE SODIUM 40 MG PO TBEC
40.0000 mg | DELAYED_RELEASE_TABLET | Freq: Every day | ORAL | Status: DC
  Administered 2018-11-02 – 2018-11-04 (×3): 40 mg via ORAL
  Filled 2018-11-01 (×3): qty 1

## 2018-11-01 MED ORDER — ONDANSETRON HCL 4 MG PO TABS: 4.0000 mg | ORAL_TABLET | Freq: Four times a day (QID) | ORAL | Status: DC | PRN

## 2018-11-01 MED ORDER — PREDNISONE 10 MG PO TABS
5.0000 mg | ORAL_TABLET | Freq: Every day | ORAL | Status: DC
  Administered 2018-11-02 – 2018-11-04 (×3): 5 mg via ORAL
  Filled 2018-11-01 (×3): qty 1

## 2018-11-01 MED ORDER — VITAMIN D3 25 MCG (1000 UNIT) PO TABS
1000.0000 [IU] | ORAL_TABLET | Freq: Every day | ORAL | Status: DC
  Administered 2018-11-02 – 2018-11-04 (×3): 1000 [IU] via ORAL
  Filled 2018-11-01 (×6): qty 1

## 2018-11-01 MED ORDER — SODIUM CHLORIDE 0.9 % IV SOLN
1.0000 g | Freq: Two times a day (BID) | INTRAVENOUS | Status: DC
  Administered 2018-11-02 – 2018-11-03 (×3): 1 g via INTRAVENOUS
  Filled 2018-11-01 (×4): qty 1

## 2018-11-01 MED ORDER — ACETAMINOPHEN 325 MG PO TABS
650.0000 mg | ORAL_TABLET | Freq: Four times a day (QID) | ORAL | Status: DC | PRN
  Administered 2018-11-02 – 2018-11-03 (×3): 650 mg via ORAL
  Filled 2018-11-01 (×3): qty 2

## 2018-11-01 MED ORDER — ONDANSETRON HCL 4 MG/2ML IJ SOLN: 4.0000 mg | Freq: Four times a day (QID) | INTRAMUSCULAR | Status: DC | PRN

## 2018-11-01 MED ORDER — ALBUTEROL SULFATE (2.5 MG/3ML) 0.083% IN NEBU: 2.5000 mg | INHALATION_SOLUTION | RESPIRATORY_TRACT | Status: DC | PRN

## 2018-11-01 MED ORDER — BISACODYL 5 MG PO TBEC: 5.0000 mg | DELAYED_RELEASE_TABLET | Freq: Every day | ORAL | Status: DC | PRN

## 2018-11-01 MED ORDER — GUAIFENESIN 100 MG/5ML PO SOLN: 5.0000 mL | ORAL | Status: DC | PRN

## 2018-11-01 MED ORDER — SODIUM CHLORIDE 0.9 % IV SOLN
INTRAVENOUS | Status: DC
  Administered 2018-11-01 – 2018-11-02 (×2): via INTRAVENOUS

## 2018-11-01 MED ORDER — VANCOMYCIN HCL IN DEXTROSE 1-5 GM/200ML-% IV SOLN
1000.0000 mg | Freq: Once | INTRAVENOUS | Status: AC
  Administered 2018-11-01: 1000 mg via INTRAVENOUS
  Filled 2018-11-01: qty 200

## 2018-11-01 MED ORDER — SODIUM CHLORIDE 0.9 % IV SOLN
2.0000 g | Freq: Once | INTRAVENOUS | Status: AC
  Administered 2018-11-01: 2 g via INTRAVENOUS
  Filled 2018-11-01: qty 2

## 2018-11-01 MED ORDER — ACETAMINOPHEN 650 MG RE SUPP: 650.0000 mg | Freq: Four times a day (QID) | RECTAL | Status: DC | PRN

## 2018-11-01 MED ORDER — VITAMIN B-12 1000 MCG PO TABS
1000.0000 ug | ORAL_TABLET | Freq: Every day | ORAL | Status: DC
  Administered 2018-11-02 – 2018-11-04 (×3): 1000 ug via ORAL
  Filled 2018-11-01 (×3): qty 1

## 2018-11-01 MED ORDER — SENNOSIDES-DOCUSATE SODIUM 8.6-50 MG PO TABS: 1.0000 | ORAL_TABLET | Freq: Every evening | ORAL | Status: DC | PRN

## 2018-11-01 MED ORDER — GABAPENTIN 300 MG PO CAPS
300.0000 mg | ORAL_CAPSULE | Freq: Three times a day (TID) | ORAL | Status: DC
  Administered 2018-11-01 – 2018-11-04 (×9): 300 mg via ORAL
  Filled 2018-11-01 (×9): qty 1

## 2018-11-01 MED ORDER — ENOXAPARIN SODIUM 40 MG/0.4ML ~~LOC~~ SOLN
40.0000 mg | SUBCUTANEOUS | Status: DC
  Administered 2018-11-01: 22:00:00 40 mg via SUBCUTANEOUS
  Filled 2018-11-01: qty 0.4

## 2018-11-01 MED ORDER — VANCOMYCIN VARIABLE DOSE PER UNSTABLE RENAL FUNCTION (PHARMACIST DOSING): Status: DC

## 2018-11-01 NOTE — Consult Note (Signed)
Pharmacy Antibiotic Note  Lauren Roman is a 85 y.o. female admitted on 11/01/2018 with HAP.  Pharmacy has been consulted for Vancomycin and Cefepime dosing.  Cefepime 2g x1 dose and Vancomycin 1000 mg x 1 dose ordered in the ED.  If using IBW for vancomycin dosing:  Expected dose: 750 mg Q36 H = Expected AUC 558.0 Expected dose: 1000 mg Q48 H = Expected AUC 558.0 Expected dose: 500 mg Q24 H = Expected AUC 558.0  If using TBW for vancomycin dosing:  Expected dose: 500 mg Q24 H = Expected AUC 468.0  Ideally, would dose patient based on IBW. However, given patient's age and renal function, vancomycin will be dosed based on levels.   Plan: 1) Cefepime 1g Q12H  2) Will order 24-Hour VR for tomorrow to better assess maintenance dose.   Height: 4\' 8"  (142.2 cm) Weight: 98 lb (44.5 kg) IBW/kg (Calculated) : 36.3     CrCl (IBW): 28 mL/min     Scr 0.8 mg/dL used   Temp (24hrs), Avg:99 F (37.2 C), Min:99 F (37.2 C), Max:99 F (37.2 C)  Recent Labs  Lab 11/01/18 1339  WBC 13.0*  CREATININE 0.70    Estimated Creatinine Clearance: 31 mL/min (by C-G formula based on SCr of 0.7 mg/dL).    Allergies  Allergen Reactions  . Exelon [Rivastigmine Tartrate]     Patient unsure of reaction  . Hydrocodone-Acetaminophen Nausea Only  . Other Itching and Rash    Anything metal on skin will cause rash and itching    Antimicrobials this admission: 6/19 Cefepime >> 6/19 Vancomycin >>   Dose adjustments this admission: N/A   Microbiology results: 6/19 BCx: pending    Thank you for allowing pharmacy to be a part of this patient's care.  Rowland Lathe 11/01/2018 3:03 PM

## 2018-11-01 NOTE — ED Triage Notes (Signed)
Pt to ER via EMS from home.  PT has physical therapy at home after back and hip surgery in September.  Therapy noted pt was weaker yesterday and today is not able to walk at all.  Pt also c/o left sided pain.

## 2018-11-01 NOTE — ED Provider Notes (Signed)
[facility], [facility] Emergency Department Provider Note    First MD Initiated Contact with Patient 11/01/18 1344     (approximate)  I have reviewed the triage vital signs and the nursing notes.   HISTORY  Chief Complaint Weakness    HPI Lauren Roman Char is a 85 y.o. female below listed past medical history presents the ER for generalized weakness as well as left hip pain.  Denies any shortness of breath or chest pain.  No head injury.  She is status post recent hip repair.  Reportedly came to the ER due to worsening generalized weakness and not working with physical therapy today.   States the pain is mild to moderate.   Past Medical History:  Diagnosis Date  . Arthritis   . Dementia (HCC)    mild  . Headache    migraines  . Hepatitis 1952   hep b-no problems since then  . Hyperlipidemia   . Myasthenia gravis (HCC)   . Tremor    right hand   History reviewed. No pertinent family history. Past Surgical History:  Procedure Laterality Date  . COLONOSCOPY    . INTRAMEDULLARY (IM) NAIL INTERTROCHANTERIC Left 09/20/2018   Procedure: INTRAMEDULLARY (IM) NAIL INTERTROCHANTRIC;  Surgeon: Kennedy BuckerMenz, Michael, MD;  Location: [facility] ORS;  Service: Orthopedics;  Laterality: Left;  . LUMBAR LAMINECTOMY/DECOMPRESSION MICRODISCECTOMY N/A 01/30/2018   Procedure: LUMBAR LAMINECTOMY/DECOMPRESSION MICRODISCECTOMY 1 LEVEL-L4-5 FAR LATERAL DISCECTOMY;  Surgeon: Venetia NightYarbrough, Chester, MD;  Location: [facility] ORS;  Service: Neurosurgery;  Laterality: N/A;   Patient Active Problem List   Diagnosis Date Noted  . Closed left hip fracture (HCC) 09/19/2018  . Lumbar radiculopathy 01/30/2018      Prior to Admission medications   Medication Sig Start Date End Date Taking? Authorizing Provider  Cholecalciferol (VITAMIN D-1000 MAX ST) 1000 units tablet Take 1,000 Units by mouth daily.    [provider]  enoxaparin (LOVENOX) 40 MG/0.4ML injection Inject 0.4 mLs (40 mg total) into the skin  daily for 14 days. 09/22/18 10/06/18  Evon SlackGaines, Thomas C, PA-C  gabapentin (NEURONTIN) 300 MG capsule Take 300 mg by mouth 3 (three) times daily.    [provider]  HYDROcodone-acetaminophen (NORCO/VICODIN) 5-325 MG tablet Take 1-2 tablets by mouth every 4 (four) hours as needed for moderate pain (pain score 4-6). 09/22/18   Evon SlackGaines, Thomas C, PA-C  omeprazole (PRILOSEC) 40 MG capsule Take 40 mg by mouth daily. 09/16/18 09/16/19  [provider]  predniSONE (DELTASONE) 5 MG tablet Take 5 mg by mouth daily. 09/05/18   [provider]  propranolol (INDERAL) 20 MG tablet Take 20 mg by mouth daily. 07/27/18   [provider]  vitamin B-12 (CYANOCOBALAMIN) 1000 MCG tablet Take 1,000 mcg by mouth daily.    [provider]    Allergies Exelon [rivastigmine tartrate], Hydrocodone-acetaminophen, and Other    Social History Social History   Tobacco Use  . Smoking status: Former Smoker    Types: Cigarettes    Quit date: 01/30/1963    Years since quitting: 55.7  . Smokeless tobacco: Never Used  . Tobacco comment: only social smoking and not even a full year  Substance Use Topics  . Alcohol use: Never    Frequency: Never  . Drug use: Never    Review of Systems Patient denies headaches, rhinorrhea, blurry vision, numbness, shortness of breath, chest pain, edema, cough, abdominal pain, nausea, vomiting, diarrhea, dysuria, fevers, rashes or hallucinations unless otherwise stated above in HPI. ____________________________________________   PHYSICAL EXAM:  VITAL SIGNS: Vitals:   11/01/18 1322 11/01/18 1326  BP:  (!) 129/59  Pulse:  80  Resp:  16  Temp:  99 F (37.2 C)  SpO2: 94% 92%    Constitutional: Alert frail appearing in NAD Eyes: Conjunctivae are normal.  Head: Atraumatic. Nose: No congestion/rhinnorhea. Mouth/Throat: Mucous membranes are moist.   Neck: No stridor. Painless ROM.  Cardiovascular: Normal rate, regular rhythm. Grossly normal  heart sounds.  Good peripheral circulation. Respiratory: Normal respiratory effort.  No retractions. Lungs with coarse bibasilar breathsounds. Gastrointestinal: Soft and nontender. No distention. No abdominal bruits. No CVA tenderness. Genitourinary:  Musculoskeletal: No lower extremity tenderness nor edema, surgical site appear well healed.  No joint effusions. Neurologic:  Normal speech and language. No gross focal neurologic deficits are appreciated. No facial droop Skin:  Skin is warm, dry and intact. No rash noted. Psychiatric: Mood and affect are normal. Speech and behavior are normal.  ____________________________________________   LABS (all labs ordered are listed, but only abnormal results are displayed)  Results for orders placed or performed during the hospital encounter of 11/01/18 (from the past 24 hour(s))  Basic metabolic panel     Status: Abnormal   Collection Time: 11/01/18  1:39 PM  Result Value Ref Range   Sodium 135 135 - 145 mmol/L   Potassium 4.0 3.5 - 5.1 mmol/L   Chloride 103 98 - 111 mmol/L   CO2 23 22 - 32 mmol/L   Glucose, Bld 112 (H) 70 - 99 mg/dL   BUN 28 (H) 8 - 23 mg/dL   Creatinine, Ser 1.610.70 0.44 - 1.00 mg/dL   Calcium 9.0 8.9 - 09.610.3 mg/dL   GFR calc non Af Amer >60 >60 mL/min   GFR calc Af Amer >60 >60 mL/min   Anion gap 9 5 - 15  CBC     Status: Abnormal   Collection Time: 11/01/18  1:39 PM  Result Value Ref Range   WBC 13.0 (H) 4.0 - 10.5 K/uL   RBC 3.94 3.87 - 5.11 MIL/uL   Hemoglobin 12.4 12.0 - 15.0 Roman/dL   HCT 04.538.1 40.936.0 - 81.146.0 %   MCV 96.7 80.0 - 100.0 fL   MCH 31.5 26.0 - 34.0 pg   MCHC 32.5 30.0 - 36.0 Roman/dL   RDW 91.413.7 78.211.5 - 95.615.5 %   Platelets 185 150 - 400 K/uL   nRBC 0.0 0.0 - 0.2 %   ____________________________________________  EKG My review and personal interpretation at Time: 13:26   Indication: weakness  Rate: 80  Rhythm: sinus Axis: normal Other: normal intervals, no stemi ____________________________________________   RADIOLOGY  I personally reviewed all radiographic images ordered to evaluate for the above acute complaints and reviewed radiology reports and findings.  These findings were personally discussed with the patient.  Please see medical record for radiology report.  ____________________________________________   PROCEDURES  Procedure(s) performed:  Procedures    Critical Care performed: no ____________________________________________   INITIAL IMPRESSION / ASSESSMENT AND PLAN / ED COURSE  Pertinent labs & imaging results that were available during my care of the patient were reviewed by me and considered in my medical decision making (see chart for details).   DDX: Fracture, contusion, arthritis, diverticulitis, colitis, musculoskeletal strain, UTI, stone  Lauren Roman Albro is a 85 y.o. who presents to the ED with symptoms as described above.  Patient complaining of left and left lower quadrant abdominal pain and generalized weakness.  Unable to provide much additional history.  Blood will be sent  for by differential.  The patient will be placed on continuous pulse oximetry and telemetry for monitoring.  Laboratory evaluation will be sent to evaluate for the above complaints.     Clinical Course as of Oct 31 1529  Fri Nov 01, 2018  1502 Patient with evidence of pneumonia on chest x-ray.  Will start be started on antibiotics for H CAP given recent admission.  Will check cultures.  Given her weakness will be admitted for further medical management.   [PR]    Clinical Course User Index [PR] Merlyn Lot, MD    The patient was evaluated in Emergency Department today for the symptoms described in the history of present illness. He/she was evaluated in the context of the global COVID-19 pandemic, which necessitated consideration that the patient might be at risk for infection with the SARS-CoV-2 virus that causes COVID-19. Institutional protocols and algorithms that pertain to the  evaluation of patients at risk for COVID-19 are in a state of rapid change based on information released by regulatory bodies including the CDC and federal and state organizations. These policies and algorithms were followed during the patient's care in the ED.  As part of my medical decision making, I reviewed the following data within the [facility] notes reviewed and incorporated, Labs reviewed, notes from prior ED visits and [place] Controlled Substance Database   ____________________________________________   FINAL CLINICAL IMPRESSION(S) / ED DIAGNOSES  Final diagnoses:  Weakness  Healthcare-associated pneumonia      NEW MEDICATIONS STARTED DURING THIS VISIT:  New Prescriptions   No medications on file     Note:  This document was prepared using Dragon voice recognition software and may include unintentional dictation errors.    Merlyn Lot, MD 11/01/18 1531

## 2018-11-01 NOTE — H&P (Signed)
[facility] [place] NAME: Lauren Roman    MR#:  [id number]  DATE OF BIRTH:  12-25-1928  DATE OF ADMISSION:  11/01/2018  PRIMARY CARE PHYSICIAN: Rusty Aus, MD   REQUESTING/REFERRING PHYSICIAN: Merlyn Lot, MD  CHIEF COMPLAINT:   Chief Complaint  Patient presents with  . Weakness   Generalized weakness. HISTORY OF PRESENT ILLNESS:  Lauren Roman  is a 85 y.o. female with a known history of arthritis, dementia, migraine, hepatitis, hyperlipidemia MG and tremor.  The patient is sent to ED due to above chief complaints.  The patient had left hip fracture and surgery recently.  The patient has worsening generalized weakness and not working with physical therapy today.  She complains of left hip pain.  She denies any fever or chills, no shortness of breath or cough.  WBC 13.  Chest x-ray showed left-sided pneumonia.  She is treated with cefepime and vancomycin in the ED.  Dr. Quentin Cornwall request admission for pneumonia. PAST MEDICAL HISTORY:   Past Medical History:  Diagnosis Date  . Arthritis   . Dementia ([place])    mild  . Headache    migraines  . Hepatitis 1952   hep b-no problems since then  . Hyperlipidemia   . Myasthenia gravis ([place])   . Tremor    right hand    PAST SURGICAL HISTORY:   Past Surgical History:  Procedure Laterality Date  . COLONOSCOPY    . INTRAMEDULLARY (IM) NAIL INTERTROCHANTERIC Left 09/20/2018   Procedure: INTRAMEDULLARY (IM) NAIL INTERTROCHANTRIC;  Surgeon: Hessie Knows, MD;  Location: [facility] ORS;  Service: Orthopedics;  Laterality: Left;  . LUMBAR LAMINECTOMY/DECOMPRESSION MICRODISCECTOMY N/A 01/30/2018   Procedure: LUMBAR LAMINECTOMY/DECOMPRESSION MICRODISCECTOMY 1 LEVEL-L4-5 FAR LATERAL DISCECTOMY;  Surgeon: Meade Maw, MD;  Location: [facility] ORS;  Service: Neurosurgery;  Laterality: N/A;    SOCIAL HISTORY:   Social History   Tobacco Use  . Smoking status: Former Smoker    Types:  Cigarettes    Quit date: 01/30/1963    Years since quitting: 55.7  . Smokeless tobacco: Never Used  . Tobacco comment: only social smoking and not even a full year  Substance Use Topics  . Alcohol use: Never    Frequency: Never    FAMILY HISTORY:  History reviewed. No pertinent family history.  Mother and daughter had breast cancer.  DRUG ALLERGIES:   Allergies  Allergen Reactions  . Exelon [Rivastigmine Tartrate]     Patient unsure of reaction  . Hydrocodone-Acetaminophen Nausea Only    Sometimes make her nausea   . Other Itching and Rash    Anything metal on skin will cause rash and itching    REVIEW OF SYSTEMS:   Review of Systems  Constitutional: Positive for malaise/fatigue. Negative for chills and fever.  HENT: Negative for sore throat.   Eyes: Negative for blurred vision and double vision.  Respiratory: Negative for cough, hemoptysis, shortness of breath, wheezing and stridor.   Cardiovascular: Negative for chest pain, palpitations, orthopnea and leg swelling.  Gastrointestinal: Negative for abdominal pain, blood in stool, diarrhea, melena, nausea and vomiting.  Genitourinary: Negative for dysuria, flank pain and hematuria.  Musculoskeletal: Positive for joint pain. Negative for back pain.  Neurological: Negative for dizziness, sensory change, focal weakness, seizures, loss of consciousness, weakness and headaches.  Endo/Heme/Allergies: Negative for polydipsia.  Psychiatric/Behavioral: Negative for depression. The patient is not nervous/anxious.     MEDICATIONS AT HOME:   Prior to Admission medications  Medication Sig Start Date End Date Taking? Authorizing Provider  Cholecalciferol (VITAMIN D-1000 MAX ST) 1000 units tablet Take 1,000 Units by mouth daily.   Yes [provider]  gabapentin (NEURONTIN) 300 MG capsule Take 300 mg by mouth 3 (three) times daily.   Yes [provider]  HYDROcodone-acetaminophen (NORCO/VICODIN) 5-325 MG tablet Take  1-2 tablets by mouth every 4 (four) hours as needed for moderate pain (pain score 4-6). 09/22/18  Yes Evon SlackGaines, Thomas C, PA-C  omeprazole (PRILOSEC) 40 MG capsule Take 40 mg by mouth daily. 09/16/18 09/16/19 Yes [provider]  predniSONE (DELTASONE) 5 MG tablet Take 5 mg by mouth daily. 09/05/18  Yes [provider]  propranolol (INDERAL) 20 MG tablet Take 20 mg by mouth daily. 07/27/18  Yes [provider]  vitamin B-12 (CYANOCOBALAMIN) 1000 MCG tablet Take 1,000 mcg by mouth daily.   Yes [provider]      VITAL SIGNS:  Blood pressure 135/62, pulse 79, temperature 99 F (37.2 C), temperature source Oral, resp. rate 17, height 4\' 8"  (1.422 m), weight 44.5 kg, SpO2 92 %.  PHYSICAL EXAMINATION:  Physical Exam  GENERAL:  85 y.o.-year-old patient lying in the bed with no acute distress.  EYES: Pupils equal, round, reactive to light and accommodation. No scleral icterus. Extraocular muscles intact.  HEENT: Head atraumatic, normocephalic. Oropharynx and nasopharynx clear.  NECK:  Supple, no jugular venous distention. No thyroid enlargement, no tenderness.  LUNGS: Normal breath sounds bilaterally, no wheezing, rales,rhonchi or crepitation. No use of accessory muscles of respiration.  CARDIOVASCULAR: S1, S2 normal. No murmurs, rubs, or gallops.  ABDOMEN: Soft, nontender, nondistended. Bowel sounds present. No organomegaly or mass.  EXTREMITIES: No pedal edema, cyanosis, or clubbing.  NEUROLOGIC: Cranial nerves II through XII are intact. Muscle strength 4/5 in all extremities. Sensation intact. Gait not checked.  PSYCHIATRIC: The patient is alert and oriented x 3.  SKIN: No obvious rash, lesion, or ulcer.   LABORATORY PANEL:   CBC Recent Labs  Lab 11/01/18 1339  WBC 13.0*  HGB 12.4  HCT 38.1  PLT 185   ------------------------------------------------------------------------------------------------------------------  Chemistries  Recent Labs  Lab  11/01/18 1339  NA 135  K 4.0  CL 103  CO2 23  GLUCOSE 112*  BUN 28*  CREATININE 0.70  CALCIUM 9.0   ------------------------------------------------------------------------------------------------------------------  Cardiac Enzymes No results for input(s): TROPONINI in the last 168 hours. ------------------------------------------------------------------------------------------------------------------  RADIOLOGY:  Dg Chest Portable 1 View  Result Date: 11/01/2018 CLINICAL DATA:  Left hip pain.  Rule out pneumonia EXAM: PORTABLE CHEST 1 VIEW COMPARISON:  09/22/2018. FINDINGS: Left lower lobe infiltrate and small left effusion has developed since the prior study. Probable pneumonia. Right lung clear. Negative for heart failure. Atherosclerotic aortic arch. IMPRESSION: Left lower lobe infiltrate and small left effusion consistent with pneumonia. Electronically Signed   By: Marlan Palauharles  Clark M.D.   On: 11/01/2018 14:57   Dg Hip Unilat W Or Wo Pelvis 2-3 Views Left  Result Date: 11/01/2018 CLINICAL DATA:  Left hip pain.  Recent left hip fracture. EXAM: DG HIP (WITH OR WITHOUT PELVIS) 2-3V LEFT COMPARISON:  09/19/2018 FINDINGS: Left intertrochanteric fracture left femur has been fixed with compression screw and intramedullary rod. Fracture alignment satisfactory. Hardware in good position. IMPRESSION: Satisfactory fixation left intertrochanteric fracture. No new fracture. Electronically Signed   By: Marlan Palauharles  Clark M.D.   On: 11/01/2018 14:56      IMPRESSION AND PLAN:   Left-sided pneumonia with leukocytosis. The patient will be admitted to  medical floor. Continue cefepime and vancomycin, Robitussin as needed, follow-up CBC and cultures.  Dehydration.  IV fluid support.  Generalized weakness.  PT evaluation.  All the records are reviewed and case discussed with ED provider. Management plans discussed with the patient, family and they are in agreement.  CODE STATUS: Full code.  TOTAL  TIME TAKING CARE OF THIS PATIENT: 43 minutes.    Shaune PollackQing Tagen Brethauer M.D on 11/01/2018 at 4:09 PM  Between 7am to 6pm - Pager - [phone]  After 6pm go to [email or web address] - Social research officer, governmentpassword [email or web address]  [employer]  Hospitalists  Office  [phone]  CC: Primary care physician; Danella PentonMiller, Mark F, MD   Note: This dictation was prepared with Dragon dictation along with smaller phrase technology. Any transcriptional errors that result from this process are unin

## 2018-11-01 NOTE — ED Notes (Signed)
ED TO INPATIENT HANDOFF REPORT  ED Nurse Name and Phone #: Victorino DikeJennifer [phone]  S Name/Age/Gender Lauren Roman 85 y.o. female Room/Bed: ED05A/ED05A  Code Status   Code Status: Prior  Home/SNF/Other Home Patient oriented to: self, place, time and situation Is this baseline? Yes   Triage Complete: Triage complete  Chief Complaint Abd Pain,Gen Weakness   Triage Note Pt to ER via EMS from home.  PT has physical therapy at home after back and hip surgery in September.  Therapy noted pt was weaker yesterday and today is not able to walk at all.  Pt also c/o left sided pain.   Allergies Allergies  Allergen Reactions  . Exelon [Rivastigmine Tartrate]     Patient unsure of reaction  . Hydrocodone-Acetaminophen Nausea Only    Sometimes make her nausea   . Other Itching and Rash    Anything metal on skin will cause rash and itching    Level of Care/Admitting Diagnosis ED Disposition    ED Disposition Condition Comment   Admit  [facility]ospital [facility] [[id number]]  Level of Care: Med-Surg [16]  Covid Evaluation: Screening Protocol (No Symptoms)  Diagnosis: HAP (hospital-acquired pneumonia) [[id number]][[id number]]  Admitting Physician: Shaune PollackHEN, QING [[id number]]  Attending Physician: Shaune PollackHEN, QING [phone][[id number]]  Estimated length of stay: past midnight tomorrow  Certification:: I certify this patient will need inpatient services for at least 2 midnights  PT Class (Do Not Modify): Inpatient [101]  PT Acc Code (Do Not Modify): Private [1]       B Medical/Surgery History Past Medical History:  Diagnosis Date  . Arthritis   . Dementia (HCC)    mild  . Headache    migraines  . Hepatitis 1952   hep b-no problems since then  . Hyperlipidemia   . Myasthenia gravis (HCC)   . Tremor    right hand   Past Surgical History:  Procedure Laterality Date  . COLONOSCOPY    . INTRAMEDULLARY (IM) NAIL INTERTROCHANTERIC Left 09/20/2018   Procedure: INTRAMEDULLARY (IM) NAIL  INTERTROCHANTRIC;  Surgeon: Kennedy BuckerMenz, Michael, MD;  Location: [facility] ORS;  Service: Orthopedics;  Laterality: Left;  . LUMBAR LAMINECTOMY/DECOMPRESSION MICRODISCECTOMY N/A 01/30/2018   Procedure: LUMBAR LAMINECTOMY/DECOMPRESSION MICRODISCECTOMY 1 LEVEL-L4-5 FAR LATERAL DISCECTOMY;  Surgeon: Venetia NightYarbrough, Chester, MD;  Location: [facility] ORS;  Service: Neurosurgery;  Laterality: N/A;     A IV Location/Drains/Wounds Patient Lines/Drains/Airways Status   Active Line/Drains/Airways    Name:   Placement date:   Placement time:   Site:   Days:   Peripheral IV 11/01/18 Right Forearm   11/01/18    1338    Forearm   less than 1   Incision (Closed) 01/30/18 Back   01/30/18    1611     275   Incision (Closed) 09/20/18 Hip   09/20/18    0837     42          Intake/Output Last 24 hours No intake or output data in the 24 hours ending 11/01/18 1657  Labs/Imaging Results for orders placed or performed during the hospital encounter of 11/01/18 (from the past 48 hour(s))  Basic metabolic panel     Status: Abnormal   Collection Time: 11/01/18  1:39 PM  Result Value Ref Range   Sodium 135 135 - 145 mmol/L   Potassium 4.0 3.5 - 5.1 mmol/L   Chloride 103 98 - 111 mmol/L   CO2 23 22 - 32 mmol/L   Glucose, Bld 112 (H) 70 - 99 mg/dL  BUN 28 (H) 8 - 23 mg/dL   Creatinine, Ser 0.70 0.44 - 1.00 mg/dL   Calcium 9.0 8.9 - 10.3 mg/dL   GFR calc non Af Amer >60 >60 mL/min   GFR calc Af Amer >60 >60 mL/min   Anion gap 9 5 - 15    Comment: Performed at [facility], [address] [place], [place] [address]  CBC     Status: Abnormal   Collection Time: 11/01/18  1:39 PM  Result Value Ref Range   WBC 13.0 (H) 4.0 - 10.5 K/uL   RBC 3.94 3.87 - 5.11 MIL/uL   Hemoglobin 12.4 12.0 - 15.0 g/dL   HCT 38.1 36.0 - 46.0 %   MCV 96.7 80.0 - 100.0 fL   MCH 31.5 26.0 - 34.0 pg   MCHC 32.5 30.0 - 36.0 g/dL   RDW 13.7 11.5 - 15.5 %   Platelets 185 150 - 400 K/uL   nRBC 0.0 0.0 - 0.2 %    Comment: Performed at  [facility], [address], [place], [place] [address]  SARS Coronavirus 2 (CEPHEID- Performed in [place] hospital lab), Hosp Order     Status: None   Collection Time: 11/01/18  3:30 PM   Specimen: Nasopharyngeal Swab  Result Value Ref Range   SARS Coronavirus 2 NEGATIVE NEGATIVE    Comment: (NOTE) If result is NEGATIVE SARS-CoV-2 target nucleic acids are NOT DETECTED. The SARS-CoV-2 RNA is generally detectable in upper and lower  respiratory specimens during the acute phase of infection. The lowest  concentration of SARS-CoV-2 viral copies this assay can detect is 250  copies / mL. A negative result does not preclude SARS-CoV-2 infection  and should not be used as the sole basis for treatment or other  patient management decisions.  A negative result may occur with  improper specimen collection / handling, submission of specimen other  than nasopharyngeal swab, presence of viral mutation(s) within the  areas targeted by this assay, and inadequate number of viral copies  (<250 copies / mL). A negative result must be combined with clinical  observations, patient history, and epidemiological information. If result is POSITIVE SARS-CoV-2 target nucleic acids are DETECTED. The SARS-CoV-2 RNA is generally detectable in upper and lower  respiratory specimens dur ing the acute phase of infection.  Positive  results are indicative of active infection with SARS-CoV-2.  Clinical  correlation with patient history and other diagnostic information is  necessary to determine patient infection status.  Positive results do  not rule out bacterial infection or co-infection with other viruses. If result is PRESUMPTIVE POSTIVE SARS-CoV-2 nucleic acids MAY BE PRESENT.   A presumptive positive result was obtained on the submitted specimen  and confirmed on repeat testing.  While 2019 novel coronavirus  (SARS-CoV-2) nucleic acids may be present in the submitted sample  additional  confirmatory testing may be necessary for epidemiological  and / or clinical management purposes  to differentiate between  SARS-CoV-2 and other Sarbecovirus currently known to infect humans.  If clinically indicated additional testing with an alternate test  methodology [phone]) is advised. The SARS-CoV-2 RNA is generally  detectable in upper and lower respiratory sp ecimens during the acute  phase of infection. The expected result is Negative. Fact Sheet for Patients:  StrictlyIdeas.no Fact Sheet for Healthcare Providers: BankingDealers.co.za This test is not yet approved or cleared by the [place] FDA and has been authorized for detection and/or diagnosis of SARS-CoV-2 by FDA under an Emergency  Use Authorization (EUA).  This EUA will remain in effect (meaning this test can be used) for the duration of the COVID-19 declaration under Section 564(b)(1) of the Act, 21 U.S.C. section 360bbb-3(b)(1), unless the authorization is terminated or revoked sooner. Performed at [facility], [address] [place], [place] [address]    Dg Chest Portable 1 View  Result Date: 11/01/2018 CLINICAL DATA:  Left hip pain.  Rule out pneumonia EXAM: PORTABLE CHEST 1 VIEW COMPARISON:  09/22/2018. FINDINGS: Left lower lobe infiltrate and small left effusion has developed since the prior study. Probable pneumonia. Right lung clear. Negative for heart failure. Atherosclerotic aortic arch. IMPRESSION: Left lower lobe infiltrate and small left effusion consistent with pneumonia. Electronically Signed   By: Marlan Palauharles  Clark M.D.   On: 11/01/2018 14:57   Dg Hip Unilat W Or Wo Pelvis 2-3 Views Left  Result Date: 11/01/2018 CLINICAL DATA:  Left hip pain.  Recent left hip fracture. EXAM: DG HIP (WITH OR WITHOUT PELVIS) 2-3V LEFT COMPARISON:  09/19/2018 FINDINGS: Left intertrochanteric fracture left femur has been fixed with compression screw and  intramedullary rod. Fracture alignment satisfactory. Hardware in good position. IMPRESSION: Satisfactory fixation left intertrochanteric fracture. No new fracture. Electronically Signed   By: Marlan Palauharles  Clark M.D.   On: 11/01/2018 14:56    Pending Labs Unresulted Labs (From admission, onward)    Start     Ordered   11/01/18 1501  Blood culture (routine x 2)  BLOOD CULTURE X 2,   STAT     11/01/18 1501   11/01/18 1331  Urinalysis, Complete w Microscopic  ONCE - STAT,   STAT     11/01/18 1330   Signed and Held  Basic metabolic panel  Tomorrow morning,   R     Signed and Held   Signed and Held  CBC  Tomorrow morning,   R     Signed and Held   Signed and Held  Creatinine, serum  (enoxaparin (LOVENOX)    CrCl >/= 30 ml/min)  Weekly,   R    Comments: while on enoxaparin therapy    Signed and Held   Signed and Held  Culture, sputum-assessment  Once,   R    Question:  Patient immune status  Answer:  Normal   Signed and Held   Signed and Held  MRSA PCR Screening  Once,   R     Signed and Held          Vitals/Pain Today's Vitals   11/01/18 1330 11/01/18 1400 11/01/18 1600 11/01/18 1630  BP: (!) 120/59 (!) 110/53 135/62 (!) 120/57  Pulse: 79 77 79 77  Resp: 16 16 17 15   Temp:      TempSrc:      SpO2: 92% 92% 92% 92%  Weight:      Height:      PainSc:        Isolation Precautions Droplet and Contact precautions  Medications Medications  vancomycin (VANCOCIN) IVPB 1000 mg/200 mL premix (1,000 mg Intravenous New Bag/Given 11/01/18 1656)  ceFEPIme (MAXIPIME) 1 g in sodium chloride 0.9 % 100 mL IVPB (has no administration in time range)  vancomycin variable dose per unstable renal function (pharmacist dosing) (has no administration in time range)  ceFEPIme (MAXIPIME) 2 g in sodium chloride 0.9 % 100 mL IVPB (0 g Intravenous Stopped 11/01/18 1651)    Mobility walks with device High fall risk   Focused Assessments Pulmonary Assessment Handoff:  Lung sounds:  R Recommendations: See Admitting Provider Note  Report given to:   Additional Notes: Pt is nonambulatory today de to weakness from pneumonia

## 2018-11-01 NOTE — Progress Notes (Signed)
Advanced Care Plan.  Purpose of Encounter: CODE STATUS. Parties in Attendance: The patient and me. Patient's Decisional Capacity: Yes. Medical Story: Lauren Roman  is a 85 y.o. female with a known history of arthritis, dementia, migraine, hepatitis, hyperlipidemia MG and tremor.    Patient is being admitted for pneumonia with leukocytosis and dehydration.  I discussed with patient about her current condition, prognosis and CODE STATUS.  The patient wants to be resuscitated and intubated if she has cardiopulmonary arrest. Plan:  Code Status: Full code. Time spent discussing advance care planning: 17 minutes.

## 2018-11-02 LAB — CBC
HCT: 36.1 % (ref 36.0–46.0)
Hemoglobin: 11.8 g/dL — ABNORMAL LOW (ref 12.0–15.0)
MCH: 31.4 pg (ref 26.0–34.0)
MCHC: 32.7 g/dL (ref 30.0–36.0)
MCV: 96 fL (ref 80.0–100.0)
Platelets: 188 10*3/uL (ref 150–400)
RBC: 3.76 MIL/uL — ABNORMAL LOW (ref 3.87–5.11)
RDW: 13.6 % (ref 11.5–15.5)
WBC: 10.3 10*3/uL (ref 4.0–10.5)
nRBC: 0 % (ref 0.0–0.2)

## 2018-11-02 LAB — BASIC METABOLIC PANEL
Anion gap: 9 (ref 5–15)
BUN: 26 mg/dL — ABNORMAL HIGH (ref 8–23)
CO2: 21 mmol/L — ABNORMAL LOW (ref 22–32)
Calcium: 8.6 mg/dL — ABNORMAL LOW (ref 8.9–10.3)
Chloride: 106 mmol/L (ref 98–111)
Creatinine, Ser: 0.57 mg/dL (ref 0.44–1.00)
GFR calc Af Amer: >60 mL/min (ref >60)
GFR calc non Af Amer: >60 mL/min (ref >60)
Glucose, Bld: 92 mg/dL (ref 70–99)
Potassium: 4.1 mmol/L (ref 3.5–5.1)
Sodium: 136 mmol/L (ref 135–145)

## 2018-11-02 MED ORDER — ENOXAPARIN SODIUM 30 MG/0.3ML ~~LOC~~ SOLN
30.0000 mg | SUBCUTANEOUS | Status: DC
  Administered 2018-11-02: 30 mg via SUBCUTANEOUS
  Filled 2018-11-02: qty 0.3

## 2018-11-02 NOTE — Plan of Care (Signed)

## 2018-11-02 NOTE — Evaluation (Signed)
Physical Therapy Evaluation Patient Details Name: Lauren PickettDorothy G Roman MRN: [id number] DOB: [birth year] Today's Date: 11/02/2018   History of Present Illness  85 y.o. female with a known history of arthritis, dementia, migraine, hepatitis, hyperlipidemia MG and tremor (r hand).  Admitted for generalized weakness (unable to ambulate with home health PT) and L hip pain, has previously undergone ORIF 09/20/2018 ( [facility]).    Clinical Impression  Patient agreeable to PT. Pt with history of dementia at baseline, pt able to provide most PLOF information and correlated to information during chart review. Pt reported she has been having a more difficult time ambulating in home, brother and daughter assist with IADLs, pt previously mod I for ADLs. Endorsed at least 1 fall.  Pt stated she had some abdominal pain that has been present for about 1 week. Pt intermittently emotional during session as well, responded well to active listening and encouragement. Pt presented with generalized weakness and fatigue, bed mobility with supervision/extended time. Sit <> stand with RW and CGA, and was able to ambulate ~3315ft with RW and CGA. Pt fatigued quickly, short shuffled step, anxious with turns. Verbal cues for breathing and to ambulate closer to RW.  Overall the patient demonstrated deficits (see "PT Problem List") that impede the patient's functional abilities, safety, and mobility and would benefit from skilled PT intervention. Recommendation is STR to maximize independence, safety, and functional abilities.     Follow Up Recommendations SNF    Equipment Recommendations  None recommended by PT;Other (comment)(Pt reported having RW at home)    Recommendations for Other Services       Precautions / Restrictions Precautions Precautions: Fall Restrictions Weight Bearing Restrictions: Yes LLE Weight Bearing: Weight bearing as tolerated      Mobility  Bed Mobility Overal bed mobility: Needs Assistance Bed  Mobility: Supine to Sit     Supine to sit: HOB elevated;Supervision     General bed mobility comments: use of bed rails, extended time  Transfers Overall transfer level: Needs assistance Equipment used: Rolling walker (2 wheeled) Transfers: Sit to/from Stand Sit to Stand: Min guard;From elevated surface         General transfer comment: extended time to maximize independence  Ambulation/Gait Ambulation/Gait assistance: Min guard Gait Distance (Feet): 15 Feet Assistive device: Rolling walker (2 wheeled)       General Gait Details: Pt fatigued quickly, short shuffled step, anxious with turns. Verbal cues for breathing and to ambulate closer to RW.  Stairs            Wheelchair Mobility    Modified Rankin (Stroke Patients Only)       Balance Overall balance assessment: Needs assistance Sitting-balance support: Feet supported Sitting balance-Leahy Scale: Fair       Standing balance-Leahy Scale: Poor                               Pertinent Vitals/Pain Pain Assessment: Faces Faces Pain Scale: Hurts a little bit Pain Location: Pt endorses some abdominal pain Pain Intervention(s): Monitored during session    Home Living Family/patient expects to be discharged to:: Private residence Living Arrangements: Other relatives(brother stays with her daughter checks on her)   Type of Home: House Home Access: Stairs to enter Entrance Stairs-Rails: Right Entrance Stairs-Number of Steps: 3 carport 4 at front door  Home Layout: One level Home Equipment: Walker - 2 wheels;Grab bars - tub/shower;Grab bars - toilet;Cane - single point Additional Comments: Pt  thinks she may have WC but pt with history of dementia    Prior Function Level of Independence: Needs assistance   Gait / Transfers Assistance Needed: patient reports she walks with a walker at home  ADL's / Homemaking Assistance Needed: Able to dress herself, bird bath, daughter assists with chores,  cooking, brother manages medications  Comments: Patient confused with history so information is subjective due to memory.      Hand Dominance        Extremity/Trunk Assessment   Upper Extremity Assessment Upper Extremity Assessment: Generalized weakness    Lower Extremity Assessment Lower Extremity Assessment: Generalized weakness       Communication   Communication: No difficulties  Cognition Arousal/Alertness: Awake/alert Behavior During Therapy: WFL for tasks assessed/performed Overall Cognitive Status: Within Functional Limits for tasks assessed                                        General Comments      Exercises Other Exercises Other Exercises: PT emotional, active listening provided about pt frustrations of current status   Assessment/Plan    PT Assessment Patient needs continued PT services  PT Problem List Decreased strength;Decreased mobility;Decreased activity tolerance;Decreased balance;Decreased knowledge of use of DME       PT Treatment Interventions DME instruction;Therapeutic exercise;Gait training;Balance training;Stair training;Neuromuscular re-education;Functional mobility training;Therapeutic activities;Patient/family education    PT Goals (Current goals can be found in the Care Plan section)  Acute Rehab PT Goals Patient Stated Goal: to go home PT Goal Formulation: With patient Time For Goal Achievement: 11/16/18 Potential to Achieve Goals: Good    Frequency Min 2X/week   Barriers to discharge        Co-evaluation               AM-PAC PT "6 Clicks" Mobility  Outcome Measure Help needed turning from your back to your side while in a flat bed without using bedrails?: A Lot Help needed moving from lying on your back to sitting on the side of a flat bed without using bedrails?: A Lot Help needed moving to and from a bed to a chair (including a wheelchair)?: A Little Help needed standing up from a chair using your  arms (e.g., wheelchair or bedside chair)?: A Little Help needed to walk in hospital room?: A Little Help needed climbing 3-5 steps with a railing? : Total 6 Click Score: 14    End of Session Equipment Utilized During Treatment: Gait belt Activity Tolerance: Patient limited by fatigue Patient left: in chair;with chair alarm set;with nursing/sitter in room;with call bell/phone within reach Nurse Communication: Mobility status PT Visit Diagnosis: Unsteadiness on feet (R26.81);Other abnormalities of gait and mobility (R26.89);Muscle weakness (generalized) (M62.81)    Time: 5277-8242 PT Time Calculation (min) (ACUTE ONLY): 31 min   Charges:   PT Evaluation $PT Eval Low Complexity: 1 Low PT Treatments $Therapeutic Activity: 8-22 mins        Lieutenant Diego PT, DPT 1:38 PM,11/02/18 [phone]

## 2018-11-02 NOTE — Consult Note (Signed)
Pharmacy Antibiotic Note  Lauren Roman is a 85 y.o. female admitted on 11/01/2018 with pneumonia. Patient admitted via EMS from home. Patient with hip replacement surgery in September 2019 and has physical therapy at home. Pharmacy has been consulted for Cefepime dosing.   Plan: Continue cefepime 1g Q12H  Patient with negative MRSA PCR. Per conversation with attending, will discontinue vancomycin.   Height: 4\' 8"  (142.2 cm) Weight: 98 lb (44.5 kg) IBW/kg (Calculated) : 36.3   Temp (24hrs), Avg:98.6 F (37 C), Min:98.2 F (36.8 C), Max:99 F (37.2 C)  Recent Labs  Lab 11/01/18 1339 11/02/18 0454  WBC 13.0* 10.3  CREATININE 0.70 0.57    Estimated Creatinine Clearance: 31 mL/min (by C-G formula based on SCr of 0.57 mg/dL).    Allergies  Allergen Reactions  . Exelon [Rivastigmine Tartrate]     Patient unsure of reaction  . Hydrocodone-Acetaminophen Nausea Only    Sometimes make her nausea   . Other Itching and Rash    Anything metal on skin will cause rash and itching    Antimicrobials this admission: Cefepime 6/19 >> Vancomycin 6/19  >>   Dose adjustments this admission: N/A   Microbiology results: 6/19 BCx: no growth < 24 hours  6/19 MRSA PCR: negative  6/19 COVID: negative    Thank you for allowing pharmacy to be a part of this patient's care.  Elford Evilsizer L 11/02/2018 7:46 AM

## 2018-11-02 NOTE — Progress Notes (Addendum)
[facility] [place] NAME: Lauren Roman    MR#:  [id number]  DATE OF BIRTH:  [birth year]  SUBJECTIVE:  Patient complains of acute on chronic back pain only, physical therapy input appreciated, patient is interested in going to rehab/SNF, change antibiotics to p.o. REVIEW OF SYSTEMS:  CONSTITUTIONAL: No fever, fatigue or weakness.  EYES: No blurred or double vision.  EARS, NOSE, AND THROAT: No tinnitus or ear pain.  RESPIRATORY: No cough, shortness of breath, wheezing or hemoptysis.  CARDIOVASCULAR: No chest pain, orthopnea, edema.  GASTROINTESTINAL: No nausea, vomiting, diarrhea or abdominal pain.  GENITOURINARY: No dysuria, hematuria.  ENDOCRINE: No polyuria, nocturia,  HEMATOLOGY: No anemia, easy bruising or bleeding SKIN: No rash or lesion. MUSCULOSKELETAL: No joint pain or arthritis.   NEUROLOGIC: No tingling, numbness, weakness.  PSYCHIATRY: No anxiety or depression.   ROS  DRUG ALLERGIES:   Allergies  Allergen Reactions  . Exelon [Rivastigmine Tartrate]     Patient unsure of reaction  . Hydrocodone-Acetaminophen Nausea Only    Sometimes make her nausea   . Other Itching and Rash    Anything metal on skin will cause rash and itching    VITALS:  Blood pressure (!) 144/56, pulse 81, temperature 98.2 F (36.8 C), temperature source Oral, resp. rate 20, height 4\' 8"  (1.422 m), weight 44.5 kg, SpO2 91 %.  PHYSICAL EXAMINATION:  GENERAL:  85 y.o.-year-old patient lying in the bed with no acute distress.  EYES: Pupils equal, round, reactive to light and accommodation. No scleral icterus. Extraocular muscles intact.  HEENT: Head atraumatic, normocephalic. Oropharynx and nasopharynx clear.  NECK:  Supple, no jugular venous distention. No thyroid enlargement, no tenderness.  LUNGS: Normal breath sounds bilaterally, no wheezing, rales,rhonchi or crepitation. No use of accessory muscles of respiration.  CARDIOVASCULAR: S1, S2 normal.  No murmurs, rubs, or gallops.  ABDOMEN: Soft, nontender, nondistended. Bowel sounds present. No organomegaly or mass.  EXTREMITIES: No pedal edema, cyanosis, or clubbing.  NEUROLOGIC: Cranial nerves II through XII are intact. Muscle strength 5/5 in all extremities. Sensation intact. Gait not checked.  PSYCHIATRIC: The patient is alert and oriented x 3.  SKIN: No obvious rash, lesion, or ulcer.   Physical Exam LABORATORY PANEL:   CBC Recent Labs  Lab 11/02/18 0454  WBC 10.3  HGB 11.8*  HCT 36.1  PLT 188   ------------------------------------------------------------------------------------------------------------------  Chemistries  Recent Labs  Lab 11/02/18 0454  NA 136  K 4.1  CL 106  CO2 21*  GLUCOSE 92  BUN 26*  CREATININE 0.57  CALCIUM 8.6*   ------------------------------------------------------------------------------------------------------------------  Cardiac Enzymes No results for input(s): TROPONINI in the last 168 hours. ------------------------------------------------------------------------------------------------------------------  RADIOLOGY:  Dg Chest Portable 1 View  Result Date: 11/01/2018 CLINICAL DATA:  Left hip pain.  Rule out pneumonia EXAM: PORTABLE CHEST 1 VIEW COMPARISON:  09/22/2018. FINDINGS: Left lower lobe infiltrate and small left effusion has developed since the prior study. Probable pneumonia. Right lung clear. Negative for heart failure. Atherosclerotic aortic arch. IMPRESSION: Left lower lobe infiltrate and small left effusion consistent with pneumonia. Electronically Signed   By: Franchot Gallo M.D.   On: 11/01/2018 14:57   Dg Hip Unilat W Or Wo Pelvis 2-3 Views Left  Result Date: 11/01/2018 CLINICAL DATA:  Left hip pain.  Recent left hip fracture. EXAM: DG HIP (WITH OR WITHOUT PELVIS) 2-3V LEFT COMPARISON:  09/19/2018 FINDINGS: Left intertrochanteric fracture left femur has been fixed with compression screw and intramedullary rod.  Fracture alignment satisfactory.  Hardware in good position. IMPRESSION: Satisfactory fixation left intertrochanteric fracture. No new fracture. Electronically Signed   By: Marlan Palauharles  Clark M.D.   On: 11/01/2018 14:56    ASSESSMENT AND PLAN:  * Acute CAP Resolving  Continue pneumonia protocol, change antibiotics to p.o./cefdinir to complete antibiotic course    *Acute dehydration  Resolved with IV fluids for rehydration   *Chronic deconditioning/generalized weakness  Physical therapy recommending skilled nursing facility status post discharge   Increase nursing care PRN, aspiration/fall/skin care precautions while in house   *Acute on chronic back pain Likely secondary to OA Scheduled Tylenol, continue Neurontin, low-dose Mobic  Disposition to SNF when bed is available   All the records are reviewed and case discussed with Care Management/Social Workerr. Management plans discussed with the patient, family and they are in agreement.  CODE STATUS: full  TOTAL TIME TAKING CARE OF THIS PATIENT: 35 minutes.     POSSIBLE D/C IN 1-2 DAYS, DEPENDING ON CLINICAL CONDITION.   Evelena AsaMontell D  M.D on 11/02/2018   Between 7am to 6pm - Pager - [phone]  After 6pm go to [email or web address] - password [email or web address]  [facility]  Office  [phone]  CC: Primary care physician; Danella PentonMiller, Mark F, MD  Note: This dictation was prepared with Dragon dictation along with smaller phrase technology. Any transcriptional errors that result from this process are unintentional.

## 2018-11-03 MED ORDER — POLYETHYLENE GLYCOL 3350 17 G PO PACK
17.0000 g | PACK | Freq: Every day | ORAL | Status: AC
  Administered 2018-11-04: 17 g via ORAL
  Filled 2018-11-03: qty 1

## 2018-11-03 MED ORDER — ENOXAPARIN SODIUM 40 MG/0.4ML ~~LOC~~ SOLN: 40.0000 mg | SUBCUTANEOUS | Status: DC

## 2018-11-03 MED ORDER — ACETAMINOPHEN 325 MG PO TABS
650.0000 mg | ORAL_TABLET | Freq: Three times a day (TID) | ORAL | Status: DC
  Administered 2018-11-03 – 2018-11-04 (×4): 650 mg via ORAL
  Filled 2018-11-03 (×4): qty 2

## 2018-11-03 MED ORDER — ENOXAPARIN SODIUM 30 MG/0.3ML ~~LOC~~ SOLN
30.0000 mg | SUBCUTANEOUS | Status: DC
  Administered 2018-11-03: 21:00:00 30 mg via SUBCUTANEOUS
  Filled 2018-11-03: qty 0.3

## 2018-11-03 MED ORDER — CEFDINIR 300 MG PO CAPS
300.0000 mg | ORAL_CAPSULE | Freq: Two times a day (BID) | ORAL | Status: DC
  Administered 2018-11-03: 21:00:00 300 mg via ORAL
  Filled 2018-11-03 (×3): qty 1

## 2018-11-03 MED ORDER — CEFDINIR 300 MG PO CAPS
300.0000 mg | ORAL_CAPSULE | Freq: Two times a day (BID) | ORAL | Status: DC
  Administered 2018-11-03: 300 mg via ORAL
  Filled 2018-11-03 (×2): qty 1

## 2018-11-03 MED ORDER — MELOXICAM 7.5 MG PO TABS
7.5000 mg | ORAL_TABLET | Freq: Every day | ORAL | Status: DC
  Administered 2018-11-03: 7.5 mg via ORAL
  Filled 2018-11-03 (×2): qty 1

## 2018-11-03 NOTE — Progress Notes (Addendum)
Lauren Roman at [place] NAME: Lauren Roman    MR#:  [id number]  DATE OF BIRTH:  18-Oct-1928  SUBJECTIVE:  Complains of back pain/constipation only, no events overnight, physical therapy input appreciated  REVIEW OF SYSTEMS:  CONSTITUTIONAL: No fever, fatigue or weakness.  EYES: No blurred or double vision.  EARS, NOSE, AND THROAT: No tinnitus or ear pain.  RESPIRATORY: No cough, shortness of breath, wheezing or hemoptysis.  CARDIOVASCULAR: No chest pain, orthopnea, edema.  GASTROINTESTINAL: No nausea, vomiting, diarrhea or abdominal pain.  GENITOURINARY: No dysuria, hematuria.  ENDOCRINE: No polyuria, nocturia,  HEMATOLOGY: No anemia, easy bruising or bleeding SKIN: No rash or lesion. MUSCULOSKELETAL: No joint pain or arthritis.   NEUROLOGIC: No tingling, numbness, weakness.  PSYCHIATRY: No anxiety or depression.   ROS  DRUG ALLERGIES:   Allergies  Allergen Reactions  . Exelon [Rivastigmine Tartrate]     Patient unsure of reaction  . Hydrocodone-Acetaminophen Nausea Only    Sometimes make her nausea   . Other Itching and Rash    Anything metal on skin will cause rash and itching    VITALS:  Blood pressure (!) 143/56, pulse 73, temperature 98.3 Roman (36.8 C), resp. rate 19, height 4\' 8"  (1.422 m), weight 44.5 kg, SpO2 94 %.  PHYSICAL EXAMINATION:  GENERAL:  85 y.o.-year-old patient lying in the bed with no acute distress.  EYES: Pupils equal, round, reactive to light and accommodation. No scleral icterus. Extraocular muscles intact.  HEENT: Head atraumatic, normocephalic. Oropharynx and nasopharynx clear.  NECK:  Supple, no jugular venous distention. No thyroid enlargement, no tenderness.  LUNGS: Normal breath sounds bilaterally, no wheezing, rales,rhonchi or crepitation. No use of accessory muscles of respiration.  CARDIOVASCULAR: S1, S2 normal. No murmurs, rubs, or gallops.  ABDOMEN: Soft, nontender, nondistended. Bowel sounds  present. No organomegaly or mass.  EXTREMITIES: No pedal edema, cyanosis, or clubbing.  NEUROLOGIC: Cranial nerves II through XII are intact. Muscle strength 5/5 in all extremities. Sensation intact. Gait not checked.  PSYCHIATRIC: The patient is alert and oriented x 3.  SKIN: No obvious rash, lesion, or ulcer.   Physical Exam LABORATORY PANEL:   CBC Recent Labs  Lab 11/02/18 0454  WBC 10.3  HGB 11.8*  HCT 36.1  PLT 188   ------------------------------------------------------------------------------------------------------------------  Chemistries  Recent Labs  Lab 11/02/18 0454  NA 136  K 4.1  CL 106  CO2 21*  GLUCOSE 92  BUN 26*  CREATININE 0.57  CALCIUM 8.6*   ------------------------------------------------------------------------------------------------------------------  Cardiac Enzymes No results for input(s): TROPONINI in the last 168 hours. ------------------------------------------------------------------------------------------------------------------  RADIOLOGY:  Dg Chest Portable 1 View  Result Date: 11/01/2018 CLINICAL DATA:  Left hip pain.  Rule out pneumonia EXAM: PORTABLE CHEST 1 VIEW COMPARISON:  09/22/2018. FINDINGS: Left lower lobe infiltrate and small left effusion has developed since the prior study. Probable pneumonia. Right lung clear. Negative for heart failure. Atherosclerotic aortic arch. IMPRESSION: Left lower lobe infiltrate and small left effusion consistent with pneumonia. Electronically Signed   By: Franchot Gallo M.D.   On: 11/01/2018 14:57   Dg Hip Unilat W Or Wo Pelvis 2-3 Views Left  Result Date: 11/01/2018 CLINICAL DATA:  Left hip pain.  Recent left hip fracture. EXAM: DG HIP (WITH OR WITHOUT PELVIS) 2-3V LEFT COMPARISON:  09/19/2018 FINDINGS: Left intertrochanteric fracture left femur has been fixed with compression screw and intramedullary rod. Fracture alignment satisfactory. Hardware in good position. IMPRESSION: Satisfactory  fixation left intertrochanteric fracture. No new fracture. Electronically  Signed   By: Marlan Palauharles  Clark M.D.   On: 11/01/2018 14:56    ASSESSMENT AND PLAN:  * AcuteCAP Resolving  Continue pneumonia protocol, changed to p.o. cefdinir to complete antibiotic course   *Acute on chronic back pain Likely due to OA Scheduled Tylenol, continue Neurontin, low-dose Mobic daily  *Acute constipation MiraLAX daily for next 2 days  *Acute dehydration  Resolved with IV fluids for rehydration   *Chronic deconditioning/generalized weakness  Physical therapy recommending SNF status post discharge  increase nursing care PRN, aspiration/fall/skin care precautions while in house   Disposition to SNF in 1-2 days barring any complications, daughter updated with all questions answered    All the records are reviewed and case discussed with Care Management/Social Workerr. Management plans discussed with the patient, family and they are in agreement.  CODE STATUS: full  TOTAL TIME TAKING CARE OF THIS PATIENT: 35 minutes.     POSSIBLE D/C IN 1-2 DAYS, DEPENDING ON CLINICAL CONDITION.   [name] M.D on 11/03/2018   Between 7am to 6pm - Pager - [phone]  After 6pm go to [email or web address] - password [email or web address]  [facility]  Office  [phone]  CC: Primary care physician; Lauren PentonMiller, Lauren F, MD  Note: This dictation was prepared with Dragon dictation along with smaller phrase technology. Any transcriptional errors that result from this process are unintentional.

## 2018-11-03 NOTE — NC FL2 (Signed)
[place] MEDICAID FL2 LEVEL OF CARE SCREENING TOOL     IDENTIFICATION  Patient Name: Lauren PickettDorothy G Charney Birthdate: 1928/09/17 Sex: female Admission Date (Current Location): 11/01/2018  [place]   Facility and Address:  [facility], [address], [place], [place] [address]      Provider Number: [id number]  Attending Physician Name and Address:  Bertrum SolSalary, Montell D, MD  Relative Name and Phone Number:  Burnis MedinYORK,KATHY W Daughter [phone]    Current Level of Care: Hospital Recommended Level of Care: Skilled Nursing Facility Prior Approval Number:    Date Approved/Denied:   PASRR Number: [id number]  Discharge Plan: Hospital    Current Diagnoses: Patient Active Problem List   Diagnosis Date Noted  . HAP (hospital-acquired pneumonia) 11/01/2018  . Closed left hip fracture (HCC) 09/19/2018  . Lumbar radiculopathy 01/30/2018    Orientation RESPIRATION BLADDER Height & Weight     Self, Place, Situation, Time  Normal Continent Weight: 98 lb (44.5 kg) Height:  4\' 8"  (142.2 cm)  BEHAVIORAL SYMPTOMS/MOOD NEUROLOGICAL BOWEL NUTRITION STATUS      Continent Diet(Cardiac)  AMBULATORY STATUS COMMUNICATION OF NEEDS Skin   Limited Assist Verbally Normal                       Personal Care Assistance Level of Assistance  Bathing, Dressing, Feeding Bathing Assistance: Limited assistance Feeding assistance: Independent Dressing Assistance: Limited assistance     Functional Limitations Info  Sight, Hearing, Speech Sight Info: Adequate Hearing Info: Adequate Speech Info: Adequate    SPECIAL CARE FACTORS FREQUENCY  PT (By licensed PT), OT (By licensed OT)     PT Frequency: Minimum 5x a week OT Frequency: Minimum 5x a week            Contractures Contractures Info: Not present    Additional Factors Info  Code Status, Allergies Code Status Info: Full Code Allergies Info: Exelon  Rivastigmine Tartrate, Hydrocodone-acetaminophen Other           Current Medications (11/03/2018):  This is the current hospital active medication list Current Facility-Administered Medications  Medication Dose Route Frequency Provider Last Rate Last Dose  . acetaminophen (TYLENOL) tablet 650 mg  650 mg Oral TID Angelina OkSalary, Montell D, MD   650 mg at 11/03/18 1656  . albuterol (PROVENTIL) (2.5 MG/3ML) 0.083% nebulizer solution 2.5 mg  2.5 mg Nebulization Q2H PRN Shaune Pollackhen, Qing, MD      . bisacodyl (DULCOLAX) EC tablet 5 mg  5 mg Oral Daily PRN Shaune Pollackhen, Qing, MD      . cefdinir (OMNICEF) capsule 300 mg  300 mg Oral Q12H Salary, Montell D, MD      . cholecalciferol (VITAMIN D) tablet 1,000 Units  1,000 Units Oral Daily Shaune Pollackhen, Qing, MD   1,000 Units at 11/03/18 [phone]  . enoxaparin (LOVENOX) injection 30 mg  30 mg Subcutaneous Q24H Bertram SavinSimpson, Michael L, RPH      . gabapentin (NEURONTIN) capsule 300 mg  300 mg Oral TID Shaune Pollackhen, Qing, MD   300 mg at 11/03/18 1656  . guaiFENesin (ROBITUSSIN) 100 MG/5ML solution 100 mg  5 mL Oral Q4H PRN Shaune Pollackhen, Qing, MD      . meloxicam [facility](MOBIC) tablet 7.5 mg  7.5 mg Oral Daily Salary, Montell D, MD   7.5 mg at 11/03/18 1656  . ondansetron (ZOFRAN) tablet 4 mg  4 mg Oral Q6H PRN Shaune Pollackhen, Qing, MD       Or  .  ondansetron (ZOFRAN) injection 4 mg  4 mg Intravenous Q6H PRN Demetrios Loll, MD      . pantoprazole (PROTONIX) EC tablet 40 mg  40 mg Oral Daily Demetrios Loll, MD   40 mg at 11/03/18 0924  . polyethylene glycol (MIRALAX / GLYCOLAX) packet 17 g  17 g Oral Daily Salary, Montell D, MD      . predniSONE (DELTASONE) tablet 5 mg  5 mg Oral Daily Demetrios Loll, MD   5 mg at 11/03/18 0925  . propranolol (INDERAL) tablet 20 mg  20 mg Oral Daily Demetrios Loll, MD   20 mg at 11/03/18 0924  . senna-docusate (Senokot-S) tablet 1 tablet  1 tablet Oral QHS PRN Demetrios Loll, MD      . vitamin B-12 (CYANOCOBALAMIN) tablet 1,000 mcg  1,000 mcg Oral Daily Demetrios Loll, MD   1,000 mcg at 11/03/18 8315     Discharge  Medications: Please see discharge summary for a list of discharge medications.  Relevant Imaging Results:  Relevant Lab Results:   Additional Information SSN [id number]  Ross Ludwig, LCSW

## 2018-11-03 NOTE — Plan of Care (Signed)

## 2018-11-03 NOTE — TOC Initial Note (Signed)
Transition of Care [facility](TOC) - Initial/Assessment Note    Patient Details  Name: Lauren Roman MRN: [id number] Date of Birth: [birth year]  Transition of Care [facility](TOC) CM/SW Contact:    [name], [name], LCSW Phone Number: 11/03/2018, 5:28 PM  Clinical Narrative:                  Patient is an 85 year old female who is alert and oriented x4.  Patient lives with her brother, and her daughter checks on her daily.  Patient says she has been to SNF before for short term rehab and was at [facility].  Patient asked CSW what other facilities are nearby, CSW left a list of facilities, and also informed her of the other options that are locally.  Patient stated she is in agreement to going to SNF, and she would like her daughter to help decide which one to go to once she has options available.  CSW explained how insurance will pay for stay, CSW was given permission [place].                                                                                           Expected Discharge Plan: Skilled Nursing Facility Barriers to Discharge: Continued Medical Work up   Patient Goals and CMS Choice Patient states their goals for this hospitalization and ongoing recovery are:: To go to short term rehab, then return back home. CMS Medicare.gov Compare Post Acute Care list provided to:: Patient Choice offered to / list presented to : Patient  Expected Discharge Plan and Services Expected Discharge Plan: Skilled Nursing Facility In-house Referral: Clinical Social Work                       DME Arranged: N/A DME Agency: NA                  Prior Living Arrangements/Services   Lives with:: Siblings Patient language and need for interpreter reviewed:: Yes Do you feel safe going back to the place where you live?: No   Patient feels she needs some short termr rehab before returning back home.  Need for Family Participation in Patient Care: No (Comment) Care giver  support system in place?: Yes (comment)   Criminal Activity/Legal Involvement Pertinent to Current Situation/Hospitalization: No - Comment as needed  Activities of Daily Living Home Assistive Devices/Equipment: None ADL Screening (condition at time of admission) Patient's cognitive ability adequate to safely complete daily activities?: Yes Is the patient deaf or have difficulty hearing?: Yes Does the patient have difficulty seeing, even when wearing glasses/contacts?: No Does the patient have difficulty concentrating, remembering, or making decisions?: No Patient able to express need for assistance with ADLs?: Yes Does the patient have difficulty dressing or bathing?: Yes Independently performs ADLs?: No Communication: Independent Dressing (OT): Needs assistance Is this a change from baseline?: Pre-admission baseline Grooming: Needs assistance Is this a change from baseline?: Pre-admission baseline Feeding: Needs assistance Is this a change from baseline?: Pre-admission baseline Bathing: Needs assistance Is this a change from baseline?: Pre-admission baseline Toileting: Needs assistance  Is this a change from baseline?: Pre-admission baseline In/Out Bed: Needs assistance Is this a change from baseline?: Pre-admission baseline Walks in Home: Needs assistance Is this a change from baseline?: Pre-admission baseline Does the patient have difficulty walking or climbing stairs?: Yes Weakness of Legs: Both Weakness of Arms/Hands: Both  Permission Sought/Granted Permission sought to share information with : Facility Sport and exercise psychologist, Family Supports Permission granted to share information with : Yes, Verbal Permission Granted  Share Information with NAME: [name] Daughter [phone]  Permission granted to share info w AGENCY: SNF admissions        Emotional Assessment Appearance:: Appears stated age Attitude/Demeanor/Rapport: Engaged Affect  (typically observed): Accepting, Appropriate, Calm, Pleasant Orientation: : Oriented to Self, Oriented to Place, Oriented to  Time, Oriented to Situation Alcohol / Substance Use: Not Applicable Psych Involvement: No (comment)  Admission diagnosis:  Weakness [R53.1] Healthcare-associated pneumonia [J18.9] Patient Active Problem List   Diagnosis Date Noted  . HAP (hospital-acquired pneumonia) 11/01/2018  . Closed left hip fracture ([place]) 09/19/2018  . Lumbar radiculopathy 01/30/2018   PCP:  Rusty Aus, MD Pharmacy:   [place], [place] - [address] [place] [address] Phone: [phone] Fax: [phone]     Social Determinants of Health (SDOH) Interventions    Readmission Risk Interventions No flowsheet data found.

## 2018-11-04 MED ORDER — ALBUTEROL SULFATE HFA 108 (90 BASE) MCG/ACT IN AERS: 2.0000 | INHALATION_SPRAY | Freq: Four times a day (QID) | RESPIRATORY_TRACT | 0 refills | Status: DC | PRN

## 2018-11-04 MED ORDER — CEFDINIR 300 MG PO CAPS: 300.0000 mg | ORAL_CAPSULE | Freq: Two times a day (BID) | ORAL | 0 refills | Status: DC

## 2018-11-04 MED ORDER — HYDROCODONE-ACETAMINOPHEN 5-325 MG PO TABS: 1.0000 | ORAL_TABLET | Freq: Four times a day (QID) | ORAL | 0 refills | Status: DC | PRN

## 2018-11-04 MED ORDER — MELOXICAM 7.5 MG PO TABS: 7.5000 mg | ORAL_TABLET | Freq: Every day | ORAL | 0 refills | Status: DC

## 2018-11-04 NOTE — Care Management Important Message (Signed)
Important Message  Patient Details  Name: Lauren Roman MRN: [id number] Date of Birth: Nov 29, 1928   Medicare Important Message Given:  Yes     Juliann Pulse A Zaydah Nawabi 11/04/2018, 11:09 AM

## 2018-11-04 NOTE — TOC Progression Note (Signed)
Transition of Care [facility]) - Progression Note    Patient Details  Name: Lauren Roman MRN: [id number] Date of Birth: 1929-01-14  [facility] [place] [place] CM/SW Contact  Shelbie Hutching, RN Phone Number: 11/04/2018, 12:34 PM  Clinical Narrative:    Patient and patient's daughter have agreed for patient to go to [facility].  Patient will discharge this afternoon.  Magda Paganini at [facility] will call with bed number.      Expected Discharge Plan: [place] Barriers to Discharge: Continued Medical Work up  Expected Discharge Plan and Services Expected Discharge Plan: [place] In-house Referral: Clinical Social Work       Expected Discharge Date: 11/04/18               DME Arranged: N/A DME Agency: NA                   Social Determinants of Health (SDOH) Interventions    Readmission Risk Interventions No flowsheet data found.

## 2018-11-04 NOTE — Progress Notes (Signed)
Pt given oral and written discharge instructions. Left facility via EMS. Unable to reach facility for report at [facility] after 3 attempts.

## 2018-11-04 NOTE — Discharge Summary (Signed)
[facility] - [place] at [facility]   PATIENT NAME: Lauren Roman    MR#:  [id number]  DATE OF BIRTH:  21-Oct-1928  DATE OF ADMISSION:  11/01/2018 ADMITTING PHYSICIAN: Shaune PollackQing Chen, MD  DATE OF DISCHARGE: No discharge date for patient encounter.  PRIMARY CARE PHYSICIAN: Danella PentonMiller, Mark F, MD    ADMISSION DIAGNOSIS:  Weakness [R53.1] Healthcare-associated pneumonia [J18.9]  DISCHARGE DIAGNOSIS:  Active Problems:   HAP (hospital-acquired pneumonia)   SECONDARY DIAGNOSIS:   Past Medical History:  Diagnosis Date  . Arthritis   . Dementia (HCC)    mild  . Headache    migraines  . Hepatitis 1952   hep b-no problems since then  . Hyperlipidemia   . Myasthenia gravis (HCC)   . Tremor    right hand    HOSPITAL COURSE:  * AcuteCAP Resolving  Treated on our pneumonia protocol, cefdinir to complete antibiotic course   *Acute on chronic back pain Stable Likely due to OA Continue pain control measures   *Acute constipation Likely opioid induced Treated with MiraLAX  *Acute dehydration  Resolved with IV fluids for rehydration   *Chronic deconditioning/generalized weakness  Physical therapy recommending SNF status post discharge  Provided increased nursing care PRN, aspiration/fall/skin care precautions while in house    DISCHARGE CONDITIONS:   stable  CONSULTS OBTAINED:    DRUG ALLERGIES:   Allergies  Allergen Reactions  . Exelon [Rivastigmine Tartrate]     Patient unsure of reaction  . Hydrocodone-Acetaminophen Nausea Only    Sometimes make her nausea   . Other Itching and Rash    Anything metal on skin will cause rash and itching    DISCHARGE MEDICATIONS:   Allergies as of 11/04/2018      Reactions   Exelon [rivastigmine Tartrate]    Patient unsure of reaction   Hydrocodone-acetaminophen Nausea Only   Sometimes make her nausea    Other Itching, Rash   Anything metal on skin will cause rash and itching      Medication  List    TAKE these medications   cefdinir 300 MG capsule Commonly known as: OMNICEF Take 1 capsule (300 mg total) by mouth every 12 (twelve) hours.   gabapentin 300 MG capsule Commonly known as: NEURONTIN Take 300 mg by mouth 3 (three) times daily.   HYDROcodone-acetaminophen 5-325 MG tablet Commonly known as: NORCO/VICODIN Take 1-2 tablets by mouth every 6 (six) hours as needed for severe pain (pain score 4-6). What changed:   when to take this  reasons to take this   meloxicam 7.5 MG tablet Commonly known as: MOBIC Take 1 tablet (7.5 mg total) by mouth daily. Start taking on: November 05, 2018   omeprazole 40 MG capsule Commonly known as: PRILOSEC Take 40 mg by mouth daily.   predniSONE 5 MG tablet Commonly known as: DELTASONE Take 5 mg by mouth daily.   propranolol 20 MG tablet Commonly known as: INDERAL Take 20 mg by mouth daily.   vitamin B-12 1000 MCG tablet Commonly known as: CYANOCOBALAMIN Take 1,000 mcg by mouth daily.   Vitamin D-1000 Max St 25 MCG (1000 UT) tablet Generic drug: Cholecalciferol Take 1,000 Units by mouth daily.        DISCHARGE INSTRUCTIONS:  If you experience worsening of your admission symptoms, develop shortness of breath, life threatening emergency, suicidal or homicidal thoughts you must seek medical attention immediately by calling 911 or calling your MD immediately  if symptoms less severe.  You Must read complete  instructions/literature along with all the possible adverse reactions/side effects for all the Medicines you take and that have been prescribed to you. Take any new Medicines after you have completely understood and accept all the possible adverse reactions/side effects.   Please note  You were cared for by a hospitalist during your hospital stay. If you have any questions about your discharge medications or the care you received while you were in the hospital after you are discharged, you can call the unit and asked to  speak with the hospitalist on call if the hospitalist that took care of you is not available. Once you are discharged, your primary care physician will handle any further medical issues. Please note that NO REFILLS for any discharge medications will be authorized once you are discharged, as it is imperative that you return to your primary care physician (or establish a relationship with a primary care physician if you do not have one) for your aftercare needs so that they can reassess your need for medications and monitor your lab values.    Today   CHIEF COMPLAINT:   Chief Complaint  Patient presents with  . Weakness    HISTORY OF PRESENT ILLNESS:    85 y.o. female with a known history of arthritis, dementia, migraine, hepatitis, hyperlipidemia MG and tremor.  The patient is sent to ED due to above chief complaints.  The patient had left hip fracture and surgery recently.  The patient has worsening generalized weakness and not working with physical therapy today.  She complains of left hip pain.  She denies any fever or chills, no shortness of breath or cough.  WBC 13.  Chest x-ray showed left-sided pneumonia.  She is treated with cefepime and vancomycin in the ED.  Dr. Quentin Cornwall request admission for pneumonia.  VITAL SIGNS:  Blood pressure (!) 128/57, pulse 67, temperature 97.8 F (36.6 C), temperature source Oral, resp. rate 18, height 4\' 8"  (1.422 m), weight 44.5 kg, SpO2 95 %.  I/O:    Intake/Output Summary (Last 24 hours) at 11/04/2018 1018 Last data filed at 11/03/2018 1814 Gross per 24 hour  Intake 480 ml  Output -  Net 480 ml    PHYSICAL EXAMINATION:  GENERAL:  85 y.o.-year-old patient lying in the bed with no acute distress.  EYES: Pupils equal, round, reactive to light and accommodation. No scleral icterus. Extraocular muscles intact.  HEENT: Head atraumatic, normocephalic. Oropharynx and nasopharynx clear.  NECK:  Supple, no jugular venous distention. No thyroid  enlargement, no tenderness.  LUNGS: Normal breath sounds bilaterally, no wheezing, rales,rhonchi or crepitation. No use of accessory muscles of respiration.  CARDIOVASCULAR: S1, S2 normal. No murmurs, rubs, or gallops.  ABDOMEN: Soft, non-tender, non-distended. Bowel sounds present. No organomegaly or mass.  EXTREMITIES: No pedal edema, cyanosis, or clubbing.  NEUROLOGIC: Cranial nerves II through XII are intact. Muscle strength 5/5 in all extremities. Sensation intact. Gait not checked.  PSYCHIATRIC: The patient is alert and oriented x 3.  SKIN: No obvious rash, lesion, or ulcer.   DATA REVIEW:   CBC Recent Labs  Lab 11/02/18 0454  WBC 10.3  HGB 11.8*  HCT 36.1  PLT 188    Chemistries  Recent Labs  Lab 11/02/18 0454  NA 136  K 4.1  CL 106  CO2 21*  GLUCOSE 92  BUN 26*  CREATININE 0.57  CALCIUM 8.6*    Cardiac Enzymes No results for input(s): TROPONINI in the last 168 hours.  Microbiology Results  Results for orders  placed or performed during the hospital encounter of 11/01/18  Blood culture (routine x 2)     Status: None (Preliminary result)   Collection Time: 11/01/18  3:30 PM   Specimen: BLOOD  Result Value Ref Range Status   Specimen Description BLOOD RIGHT ANTECUBITAL  Final   Special Requests   Final    BOTTLES DRAWN AEROBIC AND ANAEROBIC Blood Culture adequate volume   Culture   Final    NO GROWTH 3 DAYS Performed at [facility], [address] [place], [place] [address]    Report Status PENDING  Incomplete  Blood culture (routine x 2)     Status: None (Preliminary result)   Collection Time: 11/01/18  3:30 PM   Specimen: BLOOD  Result Value Ref Range Status   Specimen Description BLOOD LEFT ANTECUBITAL  Final   Special Requests   Final    BOTTLES DRAWN AEROBIC AND ANAEROBIC Blood Culture adequate volume   Culture   Final    NO GROWTH 3 DAYS Performed at [facility], [address] [place], [place] [address]    Report Status  PENDING  Incomplete  SARS Coronavirus 2 (CEPHEID- Performed in [facility] hospital lab), Hosp Order     Status: None   Collection Time: 11/01/18  3:30 PM   Specimen: Nasopharyngeal Swab  Result Value Ref Range Status   SARS Coronavirus 2 NEGATIVE NEGATIVE Final    Comment: (NOTE) If result is NEGATIVE SARS-CoV-2 target nucleic acids are NOT DETECTED. The SARS-CoV-2 RNA is generally detectable in upper and lower  respiratory specimens during the acute phase of infection. The lowest  concentration of SARS-CoV-2 viral copies this assay can detect is 250  copies / mL. A negative result does not preclude SARS-CoV-2 infection  and should not be used as the sole basis for treatment or other  patient management decisions.  A negative result may occur with  improper specimen collection / handling, submission of specimen other  than nasopharyngeal swab, presence of viral mutation(s) within the  areas targeted by this assay, and inadequate number of viral copies  (<250 copies / mL). A negative result must be combined with clinical  observations, patient history, and epidemiological information. If result is POSITIVE SARS-CoV-2 target nucleic acids are DETECTED. The SARS-CoV-2 RNA is generally detectable in upper and lower  respiratory specimens dur ing the acute phase of infection.  Positive  results are indicative of active infection with SARS-CoV-2.  Clinical  correlation with patient history and other diagnostic information is  necessary to determine patient infection status.  Positive results do  not rule out bacterial infection or co-infection with other viruses. If result is PRESUMPTIVE POSTIVE SARS-CoV-2 nucleic acids MAY BE PRESENT.   A presumptive positive result was obtained on the submitted specimen  and confirmed on repeat testing.  While 2019 novel coronavirus  (SARS-CoV-2) nucleic acids may be present in the submitted sample  additional confirmatory testing may be necessary for  epidemiological  and / or clinical management purposes  to differentiate between  SARS-CoV-2 and other Sarbecovirus currently known to infect humans.  If clinically indicated additional testing with an alternate test  methodology [phone]([id number]) is advised. The SARS-CoV-2 RNA is generally  detectable in upper and lower respiratory sp ecimens during the acute  phase of infection. The expected result is Negative. Fact Sheet for Patients:  BoilerBrush.com.cyhttps://www.fda.gov/media/136312/download Fact Sheet for Healthcare Providers: https://pope.com/https://www.fda.gov/media/136313/download This test is not yet approved or cleared by the [place] FDA and has been  authorized for detection and/or diagnosis of SARS-CoV-2 by FDA under an Emergency Use Authorization (EUA).  This EUA will remain in effect (meaning this test can be used) for the duration of the COVID-19 declaration under Section 564(b)(1) of the Act, 21 U.S.C. section 360bbb-3(b)(1), unless the authorization is terminated or revoked sooner. Performed at [facility], [address] [place], [place] [address]   MRSA PCR Screening     Status: None   Collection Time: 11/01/18  9:42 PM   Specimen: Nasal Mucosa; Nasopharyngeal  Result Value Ref Range Status   MRSA by PCR NEGATIVE NEGATIVE Final    Comment:        The GeneXpert MRSA Assay (FDA approved for NASAL specimens only), is one component of a comprehensive MRSA colonization surveillance program. It is not intended to diagnose MRSA infection nor to guide or monitor treatment for MRSA infections. Performed at [facility], [address] [place], [place] [address]     RADIOLOGY:  No results found.  EKG:   Orders placed or performed during the hospital encounter of 11/01/18  . EKG 12-Lead  . EKG 12-Lead  . ED EKG  . ED EKG      Management plans discussed with the patient, family and they are in agreement.  CODE STATUS:     Code Status Orders  (From admission,  onward)         Start     Ordered   11/01/18 1856  Full code  Continuous     11/01/18 1855        Code Status History    Date Active Date Inactive Code Status Order ID Comments User Context   09/19/2018 1749 09/23/2018 1917 Full Code [id number]  Campbell StallMayo, Katy Dodd, MD ED   01/30/2018 1959 01/31/2018 2237 Full Code [id number]  Ivar DrapeFerri, Amanda, PA-C Inpatient   Advance Care Planning Activity      TOTAL TIME TAKING CARE OF THIS PATIENT: 40 minutes.    Evelena AsaMontell D Jimma Ortman M.D on 11/04/2018 at 10:18 AM  Between 7am to 6pm - Pager - [phone]  After 6pm go to [email or web address] - password [email or web address]  [facility]  Office  [phone]  CC: Primary care physician; Danella PentonMiller, Mark F, MD   Note: This dictation was prepared with Dragon dictation along with smaller phrase technology. Any transcriptional errors that result from this process are unintentional.

## 2018-11-04 NOTE — TOC Transition Note (Signed)
Transition of Care [facility]) - CM/SW Discharge Note   Patient Details  Name: CEOLA PARA MRN: [id number] Date of Birth: Apr 03, 1929  [facility] CM/SW Contact:  Shelbie Hutching, RN Phone Number: 11/04/2018, 3:51 PM   Clinical Narrative:    Patient will discharge to [facility] to be transported by EMS.  Patient is going to room 508.  Bedside RN to call report to [phone].  Juliann Pulse patient's daughter is aware of discharge to [facility].    Final next level of care: Skilled Nursing Facility Barriers to Discharge: Barriers Resolved   Patient Goals and CMS Choice Patient states their goals for this hospitalization and ongoing recovery are:: To go to short term rehab, then return back home. CMS Medicare.gov Compare Post Acute Care list provided to:: Patient Choice offered to / list presented to : Patient  Discharge Placement              Patient [facility] bed at: [facility] Patient to be transferred to facility by: [facility] Name of family member notified: Valetta Fuller- Daughter Patient and family notified of of transfer: 11/04/18  Discharge Plan and Services In-house Referral: Clinical Social Work              DME Arranged: N/A DME Agency: NA                  Social Determinants of Health ([place]) Interventions     Readmission Risk Interventions No flowsheet data found.

## 2018-11-06 LAB — CULTURE, BLOOD (ROUTINE X 2)
Culture: NO GROWTH
Culture: NO GROWTH
Special Requests: ADEQUATE
Special Requests: ADEQUATE

## 2018-12-02 ENCOUNTER — Other Ambulatory Visit: Payer: Self-pay | Admitting: Physician Assistant

## 2018-12-02 ENCOUNTER — Ambulatory Visit
Admission: RE | Admit: 2018-12-02 | Discharge: 2018-12-02 | Disposition: A | Discharge status: Home / Self Care | Payer: Medicare Other | Source: Ambulatory Visit | Attending: Physician Assistant | Admitting: Physician Assistant

## 2018-12-02 ENCOUNTER — Other Ambulatory Visit: Payer: Self-pay

## 2018-12-02 DIAGNOSIS — M7989 Other specified soft tissue disorders: Secondary | ICD-10-CM

## 2018-12-10 DIAGNOSIS — I82412 Acute embolism and thrombosis of left femoral vein: Secondary | ICD-10-CM | POA: Insufficient documentation

## 2018-12-27 ENCOUNTER — Other Ambulatory Visit: Payer: Self-pay

## 2018-12-27 ENCOUNTER — Emergency Department: Payer: Medicare Other

## 2018-12-27 ENCOUNTER — Inpatient Hospital Stay
Admission: EM | Admit: 2018-12-27 | Discharge: 2018-12-30 | DRG: 690 | Disposition: A | Discharge status: Skilled Nursing Facility | Payer: Medicare Other | Attending: Internal Medicine | Admitting: Internal Medicine

## 2018-12-27 DIAGNOSIS — I82402 Acute embolism and thrombosis of unspecified deep veins of left lower extremity: Secondary | ICD-10-CM | POA: Diagnosis present

## 2018-12-27 DIAGNOSIS — E876 Hypokalemia: Secondary | ICD-10-CM | POA: Diagnosis present

## 2018-12-27 DIAGNOSIS — N390 Urinary tract infection, site not specified: Secondary | ICD-10-CM | POA: Diagnosis present

## 2018-12-27 DIAGNOSIS — R531 Weakness: Secondary | ICD-10-CM | POA: Diagnosis not present

## 2018-12-27 DIAGNOSIS — E785 Hyperlipidemia, unspecified: Secondary | ICD-10-CM | POA: Diagnosis present

## 2018-12-27 DIAGNOSIS — Z888 Allergy status to other drugs, medicaments and biological substances status: Secondary | ICD-10-CM

## 2018-12-27 DIAGNOSIS — M1990 Unspecified osteoarthritis, unspecified site: Secondary | ICD-10-CM | POA: Diagnosis present

## 2018-12-27 DIAGNOSIS — Z885 Allergy status to narcotic agent status: Secondary | ICD-10-CM

## 2018-12-27 DIAGNOSIS — F0390 Unspecified dementia without behavioral disturbance: Secondary | ICD-10-CM | POA: Diagnosis present

## 2018-12-27 DIAGNOSIS — K5900 Constipation, unspecified: Secondary | ICD-10-CM | POA: Diagnosis not present

## 2018-12-27 DIAGNOSIS — L8990 Pressure ulcer of unspecified site, unspecified stage: Secondary | ICD-10-CM | POA: Insufficient documentation

## 2018-12-27 DIAGNOSIS — Z8619 Personal history of other infectious and parasitic diseases: Secondary | ICD-10-CM | POA: Diagnosis not present

## 2018-12-27 DIAGNOSIS — Z8701 Personal history of pneumonia (recurrent): Secondary | ICD-10-CM

## 2018-12-27 DIAGNOSIS — G250 Essential tremor: Secondary | ICD-10-CM | POA: Diagnosis present

## 2018-12-27 DIAGNOSIS — Z79899 Other long term (current) drug therapy: Secondary | ICD-10-CM

## 2018-12-27 DIAGNOSIS — H9190 Unspecified hearing loss, unspecified ear: Secondary | ICD-10-CM | POA: Diagnosis present

## 2018-12-27 DIAGNOSIS — N3000 Acute cystitis without hematuria: Secondary | ICD-10-CM | POA: Diagnosis not present

## 2018-12-27 DIAGNOSIS — Z7952 Long term (current) use of systemic steroids: Secondary | ICD-10-CM | POA: Diagnosis not present

## 2018-12-27 DIAGNOSIS — Z87891 Personal history of nicotine dependence: Secondary | ICD-10-CM

## 2018-12-27 DIAGNOSIS — G43909 Migraine, unspecified, not intractable, without status migrainosus: Secondary | ICD-10-CM | POA: Diagnosis present

## 2018-12-27 DIAGNOSIS — B964 Proteus (mirabilis) (morganii) as the cause of diseases classified elsewhere: Secondary | ICD-10-CM | POA: Diagnosis present

## 2018-12-27 DIAGNOSIS — Z7901 Long term (current) use of anticoagulants: Secondary | ICD-10-CM | POA: Diagnosis not present

## 2018-12-27 DIAGNOSIS — Z20828 Contact with and (suspected) exposure to other viral communicable diseases: Secondary | ICD-10-CM | POA: Diagnosis present

## 2018-12-27 DIAGNOSIS — Z7951 Long term (current) use of inhaled steroids: Secondary | ICD-10-CM | POA: Diagnosis not present

## 2018-12-27 DIAGNOSIS — G7000 Myasthenia gravis without (acute) exacerbation: Secondary | ICD-10-CM | POA: Diagnosis present

## 2018-12-27 DIAGNOSIS — G629 Polyneuropathy, unspecified: Secondary | ICD-10-CM | POA: Diagnosis present

## 2018-12-27 LAB — CBC WITH DIFFERENTIAL/PLATELET
Abs Immature Granulocytes: 0.11 10*3/uL — ABNORMAL HIGH (ref 0.00–0.07)
Basophils Absolute: 0 10*3/uL (ref 0.0–0.1)
Basophils Relative: 0 %
Eosinophils Absolute: 0 10*3/uL (ref 0.0–0.5)
Eosinophils Relative: 0 %
HCT: 38.4 % (ref 36.0–46.0)
Hemoglobin: 12.4 g/dL (ref 12.0–15.0)
Immature Granulocytes: 1 %
Lymphocytes Relative: 4 %
Lymphs Abs: 0.5 10*3/uL — ABNORMAL LOW (ref 0.7–4.0)
MCH: 30.7 pg (ref 26.0–34.0)
MCHC: 32.3 g/dL (ref 30.0–36.0)
MCV: 95 fL (ref 80.0–100.0)
Monocytes Absolute: 0.7 10*3/uL (ref 0.1–1.0)
Monocytes Relative: 6 %
Neutro Abs: 10.6 10*3/uL — ABNORMAL HIGH (ref 1.7–7.7)
Neutrophils Relative %: 89 %
Platelets: 148 10*3/uL — ABNORMAL LOW (ref 150–400)
RBC: 4.04 MIL/uL (ref 3.87–5.11)
RDW: 13.3 % (ref 11.5–15.5)
WBC: 11.9 10*3/uL — ABNORMAL HIGH (ref 4.0–10.5)
nRBC: 0 % (ref 0.0–0.2)

## 2018-12-27 LAB — URINALYSIS, COMPLETE (UACMP) WITH MICROSCOPIC
Bilirubin Urine: NEGATIVE
Glucose, UA: NEGATIVE mg/dL
Ketones, ur: 5 mg/dL — AB
Nitrite: NEGATIVE
Protein, ur: 100 mg/dL — AB
Specific Gravity, Urine: 1.016 (ref 1.005–1.030)
Squamous Epithelial / HPF: NONE SEEN (ref 0–5)
WBC, UA: >50 WBC/hpf — ABNORMAL HIGH (ref 0–5)
pH: 7 (ref 5.0–8.0)

## 2018-12-27 LAB — BASIC METABOLIC PANEL
Anion gap: 9 (ref 5–15)
BUN: 20 mg/dL (ref 8–23)
CO2: 19 mmol/L — ABNORMAL LOW (ref 22–32)
Calcium: 7.8 mg/dL — ABNORMAL LOW (ref 8.9–10.3)
Chloride: 108 mmol/L (ref 98–111)
Creatinine, Ser: 0.8 mg/dL (ref 0.44–1.00)
GFR calc Af Amer: >60 mL/min (ref >60)
GFR calc non Af Amer: >60 mL/min (ref >60)
Glucose, Bld: 117 mg/dL — ABNORMAL HIGH (ref 70–99)
Potassium: 3.3 mmol/L — ABNORMAL LOW (ref 3.5–5.1)
Sodium: 136 mmol/L (ref 135–145)

## 2018-12-27 LAB — TSH: TSH: 0.591 u[IU]/mL (ref 0.350–4.500)

## 2018-12-27 MED ORDER — HYDROCHLOROTHIAZIDE 25 MG PO TABS: 25.0000 mg | ORAL_TABLET | Freq: Every day | ORAL | Status: DC

## 2018-12-27 MED ORDER — SODIUM CHLORIDE 0.9 % IV SOLN
1.0000 g | Freq: Once | INTRAVENOUS | Status: AC
  Administered 2018-12-27: 1 g via INTRAVENOUS
  Filled 2018-12-27: qty 10

## 2018-12-27 MED ORDER — SODIUM CHLORIDE 0.9% FLUSH
3.0000 mL | Freq: Two times a day (BID) | INTRAVENOUS | Status: DC
  Administered 2018-12-27 – 2018-12-28 (×2): 3 mL via INTRAVENOUS

## 2018-12-27 MED ORDER — VITAMIN B-12 1000 MCG PO TABS
1000.0000 ug | ORAL_TABLET | Freq: Every day | ORAL | Status: DC
  Administered 2018-12-28 – 2018-12-30 (×3): 1000 ug via ORAL
  Filled 2018-12-27 (×3): qty 1

## 2018-12-27 MED ORDER — SODIUM CHLORIDE 0.9 % IV SOLN
1.0000 g | INTRAVENOUS | Status: DC
  Administered 2018-12-28 – 2018-12-29 (×2): 1 g via INTRAVENOUS
  Filled 2018-12-27 (×2): qty 1
  Filled 2018-12-27: qty 10

## 2018-12-27 MED ORDER — ONDANSETRON HCL 4 MG PO TABS: 4.0000 mg | ORAL_TABLET | Freq: Four times a day (QID) | ORAL | Status: DC | PRN

## 2018-12-27 MED ORDER — ACETAMINOPHEN 650 MG RE SUPP: 650.0000 mg | Freq: Four times a day (QID) | RECTAL | Status: DC | PRN

## 2018-12-27 MED ORDER — SODIUM CHLORIDE 0.9 % IV SOLN
INTRAVENOUS | Status: DC
  Administered 2018-12-27 – 2018-12-30 (×5): via INTRAVENOUS

## 2018-12-27 MED ORDER — POTASSIUM CHLORIDE 20 MEQ PO PACK
40.0000 meq | PACK | Freq: Once | ORAL | Status: AC
  Administered 2018-12-27: 40 meq via ORAL
  Filled 2018-12-27: qty 2

## 2018-12-27 MED ORDER — ONDANSETRON HCL 4 MG/2ML IJ SOLN: 4.0000 mg | Freq: Four times a day (QID) | INTRAMUSCULAR | Status: DC | PRN

## 2018-12-27 MED ORDER — ACETAMINOPHEN 325 MG PO TABS
650.0000 mg | ORAL_TABLET | Freq: Four times a day (QID) | ORAL | Status: DC | PRN
  Administered 2018-12-27 – 2018-12-30 (×2): 650 mg via ORAL
  Filled 2018-12-27 (×2): qty 2

## 2018-12-27 MED ORDER — GABAPENTIN 300 MG PO CAPS
300.0000 mg | ORAL_CAPSULE | Freq: Three times a day (TID) | ORAL | Status: DC
  Administered 2018-12-27 – 2018-12-30 (×9): 300 mg via ORAL
  Filled 2018-12-27 (×9): qty 1

## 2018-12-27 MED ORDER — PANTOPRAZOLE SODIUM 40 MG PO TBEC
40.0000 mg | DELAYED_RELEASE_TABLET | Freq: Every day | ORAL | Status: DC
  Administered 2018-12-28 – 2018-12-30 (×3): 40 mg via ORAL
  Filled 2018-12-27 (×3): qty 1

## 2018-12-27 MED ORDER — PREDNISONE 10 MG PO TABS
5.0000 mg | ORAL_TABLET | Freq: Every day | ORAL | Status: DC
  Administered 2018-12-28 – 2018-12-30 (×3): 5 mg via ORAL
  Filled 2018-12-27 (×3): qty 1

## 2018-12-27 MED ORDER — RIVAROXABAN 15 MG PO TABS
15.0000 mg | ORAL_TABLET | Freq: Two times a day (BID) | ORAL | Status: DC
  Administered 2018-12-27 – 2018-12-29 (×4): 15 mg via ORAL
  Filled 2018-12-27 (×5): qty 1

## 2018-12-27 MED ORDER — FLUTICASONE PROPIONATE 50 MCG/ACT NA SUSP
1.0000 | Freq: Every day | NASAL | Status: DC
  Administered 2018-12-28 – 2018-12-30 (×3): 1 via NASAL
  Filled 2018-12-27: qty 16

## 2018-12-27 MED ORDER — POLYVINYL ALCOHOL 1.4 % OP SOLN
1.0000 [drp] | Freq: Every day | OPHTHALMIC | Status: DC
  Administered 2018-12-28 – 2018-12-30 (×3): 1 [drp] via OPHTHALMIC
  Filled 2018-12-27: qty 15

## 2018-12-27 MED ORDER — POLYETHYLENE GLYCOL 3350 17 G PO PACK
17.0000 g | PACK | Freq: Every day | ORAL | Status: DC | PRN
  Administered 2018-12-28 – 2018-12-30 (×2): 17 g via ORAL
  Filled 2018-12-27 (×2): qty 1

## 2018-12-27 MED ORDER — PROPRANOLOL HCL 20 MG PO TABS
20.0000 mg | ORAL_TABLET | Freq: Every day | ORAL | Status: DC
  Administered 2018-12-28 – 2018-12-30 (×3): 20 mg via ORAL
  Filled 2018-12-27 (×3): qty 1

## 2018-12-27 NOTE — ED Provider Notes (Signed)
[facility] Emergency Department Provider Note   ____________________________________________   I have reviewed the triage vital signs and the nursing notes.   HISTORY  Chief Complaint Weakness   History limited by: Not Limited   HPI Lauren Roman is a 85 y.o. female who presents to the emergency department today because of concerns for weakness.  She states it is somewhat generalized however slightly worse in her lower extremities.  Has been present for the past 2 to 3 days.  She says it is gradually getting worse.  Patient denies any associated chest pain or shortness of breath.  She denies any cough.  Patient states she was treated for pneumonia a few weeks ago.  Patient denies any fevers.  Records reviewed. Per medical record review patient has a history of admission for weakness and HCAP  Past Medical History:  Diagnosis Date  . Arthritis   . Dementia (HCC)    mild  . Headache    migraines  . Hepatitis 1952   hep b-no problems since then  . Hyperlipidemia   . Myasthenia gravis (HCC)   . Tremor    right hand    Patient Active Problem List   Diagnosis Date Noted  . HAP (hospital-acquired pneumonia) 11/01/2018  . Closed left hip fracture (HCC) 09/19/2018  . Lumbar radiculopathy 01/30/2018    Past Surgical History:  Procedure Laterality Date  . COLONOSCOPY    . INTRAMEDULLARY (IM) NAIL INTERTROCHANTERIC Left 09/20/2018   Procedure: INTRAMEDULLARY (IM) NAIL INTERTROCHANTRIC;  Surgeon: Kennedy BuckerMenz, Michael, MD;  Location: [facility] ORS;  Service: Orthopedics;  Laterality: Left;  . LUMBAR LAMINECTOMY/DECOMPRESSION MICRODISCECTOMY N/A 01/30/2018   Procedure: LUMBAR LAMINECTOMY/DECOMPRESSION MICRODISCECTOMY 1 LEVEL-L4-5 FAR LATERAL DISCECTOMY;  Surgeon: Venetia NightYarbrough, Chester, MD;  Location: [facility] ORS;  Service: Neurosurgery;  Laterality: N/A;    Prior to Admission medications   Medication Sig Start Date End Date Taking? Authorizing Provider  albuterol  (VENTOLIN HFA) 108 (90 Base) MCG/ACT inhaler Inhale 2 puffs into the lungs every 6 (six) hours as needed for up to 30 days for wheezing or shortness of breath. 11/04/18 12/04/18  Enid BaasKalisetti, Radhika, MD  cefdinir (OMNICEF) 300 MG capsule Take 1 capsule (300 mg total) by mouth every 12 (twelve) hours. 11/04/18   Salary, Evelena AsaMontell D, MD  Cholecalciferol (VITAMIN D-1000 MAX ST) 1000 units tablet Take 1,000 Units by mouth daily.    [provider]  gabapentin (NEURONTIN) 300 MG capsule Take 300 mg by mouth 3 (three) times daily.    [provider]  HYDROcodone-acetaminophen (NORCO/VICODIN) 5-325 MG tablet Take 1-2 tablets by mouth every 6 (six) hours as needed for severe pain (pain score 4-6). 11/04/18   Salary, Evelena AsaMontell D, MD  meloxicam (MOBIC) 7.5 MG tablet Take 1 tablet (7.5 mg total) by mouth daily. 11/05/18   Salary, Evelena AsaMontell D, MD  omeprazole (PRILOSEC) 40 MG capsule Take 40 mg by mouth daily. 09/16/18 09/16/19  [provider]  predniSONE (DELTASONE) 5 MG tablet Take 5 mg by mouth daily. 09/05/18   [provider]  propranolol (INDERAL) 20 MG tablet Take 20 mg by mouth daily. 07/27/18   [provider]  vitamin B-12 (CYANOCOBALAMIN) 1000 MCG tablet Take 1,000 mcg by mouth daily.    [provider]    Allergies Exelon [rivastigmine tartrate], Hydrocodone-acetaminophen, and Other  History reviewed. No pertinent family history.  Social History Social History   Tobacco Use  . Smoking status: Former Smoker    Quit date: 01/30/1963    Years since  quitting: 55.9  . Smokeless tobacco: Never Used  . Tobacco comment: only social smoking and not even a full year  Substance Use Topics  . Alcohol use: Never    Frequency: Never  . Drug use: Never    Review of Systems Constitutional: Positive for generalized weakness. Eyes: No visual changes. ENT: No sore throat. Cardiovascular: Denies chest pain. Respiratory: Denies shortness of  breath. Gastrointestinal: No abdominal pain.  No nausea, no vomiting.  No diarrhea.   Genitourinary: Negative for dysuria. Musculoskeletal: Negative for back pain. Skin: Negative for rash. Neurological: Negative for headaches, focal weakness or numbness.  ____________________________________________   PHYSICAL EXAM:  VITAL SIGNS: ED Triage Vitals  Enc Vitals Group     BP 12/27/18 1450 (!) 130/58     Pulse Rate 12/27/18 1440 75     Resp 12/27/18 1450 18     Temp 12/27/18 1440 97.7 F (36.5 C)     Temp Source 12/27/18 1440 Oral     SpO2 12/27/18 1440 94 %     Weight 12/27/18 1443 106 lb (48.1 kg)     Height 12/27/18 1443 5' (1.524 m)     Head Circumference --      Peak Flow --      Pain Score 12/27/18 1443 0   Constitutional: Alert and oriented.  Eyes: Conjunctivae are normal.  ENT      Head: Normocephalic and atraumatic.      Nose: No congestion/rhinnorhea.      Mouth/Throat: Mucous membranes are moist.      Neck: No stridor. Hematological/Lymphatic/Immunilogical: No cervical lymphadenopathy. Cardiovascular: Normal rate, regular rhythm.  No murmurs, rubs, or gallops.  Respiratory: Normal respiratory effort without tachypnea nor retractions. Breath sounds are clear and equal bilaterally. No wheezes/rales/rhonchi. Gastrointestinal: Soft and non tender. No rebound. No guarding.  Genitourinary: Deferred Musculoskeletal: Normal range of motion in all extremities. No lower extremity edema. Neurologic:  Normal speech and language. No gross focal neurologic deficits are appreciated.  Skin:  Skin is warm, dry and intact. No rash noted. Psychiatric: Mood and affect are normal. Speech and behavior are normal. Patient exhibits appropriate insight and judgment.  ____________________________________________    LABS (pertinent positives/negatives)  BMP k 3.3, co2 19, glu 117, ca 7.8 UA hazy, moderate hgb dipstick, ketones 5, protein 100, large leukocytes, >50 WBC CBC wbc 11.9, hgb  12.4, plt 148  ____________________________________________   EKG  I, Nance Pear, attending physician, personally viewed and interpreted this EKG  EKG Time: 1445 Rate: 70 Rhythm: sinus rhythm Axis: left axis deviation Intervals: qtc 472 QRS: narrow ST changes: no st elevation Impression: abnormal ekg   ____________________________________________    RADIOLOGY  CXR No acute abnormality  ____________________________________________   PROCEDURES  Procedures  ____________________________________________   INITIAL IMPRESSION / ASSESSMENT AND PLAN / ED COURSE  Pertinent labs & imaging results that were available during my care of the patient were reviewed by me and considered in my medical decision making (see chart for details).   Patient presented to the emergency department today with concerns for generalized weakness.  Differential would be broad including infection, anemia, dehydration, electrolyte abnormality amongst other etiologies.  Patient's work-up was concerning for urinary tract infection.  Patient did have recent admission for pneumonia however chest x-ray today with findings concerning for pneumonia.  Will plan on starting IV antibiotics and admission to the hospitalist service. ____________________________________________   FINAL CLINICAL IMPRESSION(S) / ED DIAGNOSES  Final diagnoses:  Weakness  Lower urinary tract infectious disease  Note: This dictation was prepared with Dragon dictation. Any transcriptional errors that result from this process are unintentional     Phineas SemenGoodman, Alex Mcmanigal, MD 12/27/18 1924

## 2018-12-27 NOTE — ED Notes (Addendum)
ED TO INPATIENT HANDOFF REPORT  ED Nurse Name and Phone #: Geraldine ContrasDee [phone]  S Name/Age/Gender Lauren Pickettorothy G Houseworth 85 y.o. female Room/Bed: ED13A/ED13A  Code Status   Code Status: Prior  Home/SNF/Other Home Patient oriented to: self, place, time and situation Is this baseline? Yes   Triage Complete: Triage complete  Chief Complaint Generalized Weakness   Triage Note Pt arrived via EMS from home complaining of generalized weakness X 3 days, history of pneumonia.Placed on 2 L [place] by EMS. Crackles auscultated on left lower lobe.    Allergies Allergies  Allergen Reactions  . Exelon [Rivastigmine Tartrate]     Patient unsure of reaction  . Hydrocodone-Acetaminophen Nausea Only    Sometimes make her nausea   . Other Itching and Rash    Anything metal on skin will cause rash and itching    Level of Care/Admitting Diagnosis ED Disposition    ED Disposition Condition Comment   Admit  The patient appears reasonably stabilized for admission considering the current resources, flow, and capabilities available in the ED at this time, and I doubt any other [facility] requiring further screening and/or treatment in the ED prior to admission is  present.       B Medical/Surgery History Past Medical History:  Diagnosis Date  . Arthritis   . Dementia (HCC)    mild  . Headache    migraines  . Hepatitis 1952   hep b-no problems since then  . Hyperlipidemia   . Myasthenia gravis (HCC)   . Tremor    right hand   Past Surgical History:  Procedure Laterality Date  . COLONOSCOPY    . INTRAMEDULLARY (IM) NAIL INTERTROCHANTERIC Left 09/20/2018   Procedure: INTRAMEDULLARY (IM) NAIL INTERTROCHANTRIC;  Surgeon: Kennedy BuckerMenz, Michael, MD;  Location: [facility] ORS;  Service: Orthopedics;  Laterality: Left;  . LUMBAR LAMINECTOMY/DECOMPRESSION MICRODISCECTOMY N/A 01/30/2018   Procedure: LUMBAR LAMINECTOMY/DECOMPRESSION MICRODISCECTOMY 1 LEVEL-L4-5 FAR LATERAL DISCECTOMY;  Surgeon: Venetia NightYarbrough, Chester, MD;  Location: [facility]  ORS;  Service: Neurosurgery;  Laterality: N/A;     A IV Location/Drains/Wounds Patient Lines/Drains/Airways Status   Active Line/Drains/Airways    Name:   Placement date:   Placement time:   Site:   Days:   Peripheral IV 12/27/18 Left Wrist   12/27/18    1452    Wrist   less than 1   Peripheral IV 12/27/18 Right Forearm   12/27/18    1828    Forearm   less than 1   Incision (Closed) 01/30/18 Back   01/30/18    1611     331   Incision (Closed) 09/20/18 Hip   09/20/18    0837     98          Intake/Output Last 24 hours  Intake/Output Summary (Last 24 hours) at 12/27/2018 1915 Last data filed at 12/27/2018 1756 Gross per 24 hour  Intake -  Output 20 ml  Net -20 ml    Labs/Imaging Results for orders placed or performed during the hospital encounter of 12/27/18 (from the past 48 hour(s))  CBC with Differential     Status: Abnormal   Collection Time: 12/27/18  2:56 PM  Result Value Ref Range   WBC 11.9 (H) 4.0 - 10.5 K/uL   RBC 4.04 3.87 - 5.11 MIL/uL   Hemoglobin 12.4 12.0 - 15.0 g/dL   HCT 16.138.4 09.636.0 - 04.546.0 %   MCV 95.0 80.0 - 100.0 fL   MCH 30.7 26.0 - 34.0 pg   MCHC 32.3 30.0 -  36.0 g/dL   RDW 13.3 11.5 - 15.5 %   Platelets 148 (L) 150 - 400 K/uL   nRBC 0.0 0.0 - 0.2 %   Neutrophils Relative % 89 %   Neutro Abs 10.6 (H) 1.7 - 7.7 K/uL   Lymphocytes Relative 4 %   Lymphs Abs 0.5 (L) 0.7 - 4.0 K/uL   Monocytes Relative 6 %   Monocytes Absolute 0.7 0.1 - 1.0 K/uL   Eosinophils Relative 0 %   Eosinophils Absolute 0.0 0.0 - 0.5 K/uL   Basophils Relative 0 %   Basophils Absolute 0.0 0.0 - 0.1 K/uL   Immature Granulocytes 1 %   Abs Immature Granulocytes 0.11 (H) 0.00 - 0.07 K/uL    Comment: Performed at [facility], [address] [place], [place] [address]  Basic metabolic panel     Status: Abnormal   Collection Time: 12/27/18  2:56 PM  Result Value Ref Range   Sodium 136 135 - 145 mmol/L   Potassium 3.3 (L) 3.5 - 5.1 mmol/L   Chloride 108 98 - 111 mmol/L    CO2 19 (L) 22 - 32 mmol/L   Glucose, Bld 117 (H) 70 - 99 mg/dL   BUN 20 8 - 23 mg/dL   Creatinine, Ser 0.80 0.44 - 1.00 mg/dL   Calcium 7.8 (L) 8.9 - 10.3 mg/dL   GFR calc non Af Amer >60 >60 mL/min   GFR calc Af Amer >60 >60 mL/min   Anion gap 9 5 - 15    Comment: Performed at [facility], [address] [place], [place] [address]  Urinalysis, Complete w Microscopic     Status: Abnormal   Collection Time: 12/27/18  3:41 PM  Result Value Ref Range   Color, Urine YELLOW (A) YELLOW   APPearance HAZY (A) CLEAR   Specific Gravity, Urine 1.016 1.005 - 1.030   pH 7.0 5.0 - 8.0   Glucose, UA NEGATIVE NEGATIVE mg/dL   Hgb urine dipstick MODERATE (A) NEGATIVE   Bilirubin Urine NEGATIVE NEGATIVE   Ketones, ur 5 (A) NEGATIVE mg/dL   Protein, ur 100 (A) NEGATIVE mg/dL   Nitrite NEGATIVE NEGATIVE   Leukocytes,Ua LARGE (A) NEGATIVE   RBC / HPF 0-5 0 - 5 RBC/hpf   WBC, UA >50 (H) 0 - 5 WBC/hpf   Bacteria, UA FEW (A) NONE SEEN   Squamous Epithelial / LPF NONE SEEN 0 - 5   Mucus PRESENT     Comment: Performed at [facility], [address], [place], [place] [address]   Dg Chest Portable 1 View  Result Date: 12/27/2018 CLINICAL DATA:  Generalized weakness for 3 days. History of pneumonia. EXAM: PORTABLE CHEST 1 VIEW COMPARISON:  11/01/2018 FINDINGS: Heart size is normal. Aortic atherosclerosis. There is no pleural effusion or edema identified. No airspace opacities identified. Lung volumes appear low. IMPRESSION: 1. No acute cardiopulmonary abnormalities. 2. Aortic Atherosclerosis (ICD10-I70.0). Electronically Signed   By: Kerby Moors M.D.   On: 12/27/2018 15:59    Pending Labs Unresulted Labs (From admission, onward)    Start     Ordered   12/27/18 1822  SARS CORONAVIRUS 2 Nasal Swab Aptima Multi Swab  (Asymptomatic/Tier 2 Patients Labs)  Once,   STAT    Question Answer Comment  Is this test for diagnosis or screening Screening   Symptomatic for COVID-19 as  defined by CDC Unknown   Hospitalized for COVID-19 Unknown   Admitted to ICU for COVID-19 Unknown   Previously tested for  COVID-19 Unknown   Resident in a congregate (group) care setting Unknown   Employed in healthcare setting Unknown   Pregnant Unknown      12/27/18 1822          Vitals/Pain Today's Vitals   12/27/18 1800 12/27/18 1830 12/27/18 1834 12/27/18 1900  BP: (!) 117/51 (!) 120/59  (!) 133/52  Pulse: 72 74  (!) 252  Resp: (!) 23 (!) 25  (!) 21  Temp:      TempSrc:      SpO2: 98% 95%  (!) 72%  Weight:      Height:      PainSc:   0-No pain     Isolation Precautions No active isolations  Medications Medications  cefTRIAXone (ROCEPHIN) 1 g in sodium chloride 0.9 % 100 mL IVPB (0 g Intravenous Stopped 12/27/18 1915)    Mobility walks with device High fall risk   Focused Assessments    R Recommendations: See Admitting Provider Note  Report given to: Irene Papatianna, RN

## 2018-12-27 NOTE — ED Triage Notes (Signed)
Pt arrived via EMS from home complaining of generalized weakness X 3 days, history of pneumonia.Placed on 2 L [place] by EMS. Crackles auscultated on left lower lobe.

## 2018-12-27 NOTE — Progress Notes (Signed)
Advance care planning  Purpose of Encounter UTI, generalized weakness  Parties in Attendance Patient, daughter -Lauren Roman ( healthcare power of attorney) at bedside  Patients Decisional capacity Patient is alert and oriented.  Able to make medical decisions.  Documented healthcare power of attorney at bedside  No ACP documents in place  Discussed in detail regarding UTI, generalized weakness.  Treatment plan , prognosis discussed.  All questions answered  Discussed regarding CODE STATUS.  Patient and daughter request aggressive medical care with CPR/intubation/defibrillation if needed.  All life prolonging measures to be attempted.  Orders entered and CODE STATUS changed  FULL CODE  Time spent - 17 minutes

## 2018-12-27 NOTE — H&P (Signed)
[facility] [place] [facility]   PATIENT NAME: Lauren Roman    MR#:  [id number]  DATE OF BIRTH:  January 15, 1929  DATE OF ADMISSION:  12/27/2018  PRIMARY CARE PHYSICIAN: Danella PentonMiller, Mark F, MD   REQUESTING/REFERRING PHYSICIAN: Phineas SemenGraydon Goodman, MD  CHIEF COMPLAINT:   Chief Complaint  Patient presents with  . Weakness    HISTORY OF PRESENT ILLNESS:  Lauren Roman  is a 85 y.o. female with a known history of mild dementia, myasthenia gravis, hyperlipidemia, hepatitis B, right hand tremor.  Patient was brought to the emergency room today by her daughter with concerns of increasing weakness over the last 2 to 3 days.  Patient was treated for pneumonia a few weeks ago as well as finding of left lower extremity DVT.  She is on Xarelto currently.  The patient endorses dysuria as well as foul urine odor and dark urine.  This has been present for at least 3 days that she and her daughter are aware of.  She has noted nausea however no vomiting.  She is unaware of any fevers or chills.  She denies diarrhea.  She notes suprapubic/lower abdominal tenderness.  She denies chest pain or increase shortness of breath.  She denies cough.  Labs on arrival demonstrate potassium 3.3, urinalysis with large amount leukocytes, positive bacteria, white blood cells greater than 50.  WBC is 11.9.  Chest x-ray shows no acute pulmonary disease.  We have admitted her to the hospitalist service for generalized weakness and urinary tract infection as well as hypokalemia.  PAST MEDICAL HISTORY:   Past Medical History:  Diagnosis Date  . Arthritis   . Dementia (HCC)    mild  . Headache    migraines  . Hepatitis 1952   hep b-no problems since then  . Hyperlipidemia   . Myasthenia gravis (HCC)   . Tremor    right hand    PAST SURGICAL HISTORY:   Past Surgical History:  Procedure Laterality Date  . COLONOSCOPY    . INTRAMEDULLARY (IM) NAIL INTERTROCHANTERIC Left 09/20/2018   Procedure:  INTRAMEDULLARY (IM) NAIL INTERTROCHANTRIC;  Surgeon: Kennedy BuckerMenz, Michael, MD;  Location: [facility] ORS;  Service: Orthopedics;  Laterality: Left;  . LUMBAR LAMINECTOMY/DECOMPRESSION MICRODISCECTOMY N/A 01/30/2018   Procedure: LUMBAR LAMINECTOMY/DECOMPRESSION MICRODISCECTOMY 1 LEVEL-L4-5 FAR LATERAL DISCECTOMY;  Surgeon: Venetia NightYarbrough, Chester, MD;  Location: [facility] ORS;  Service: Neurosurgery;  Laterality: N/A;    SOCIAL HISTORY:   Social History   Tobacco Use  . Smoking status: Former Smoker    Quit date: 01/30/1963    Years since quitting: 55.9  . Smokeless tobacco: Never Used  . Tobacco comment: only social smoking and not even a full year  Substance Use Topics  . Alcohol use: Never    Frequency: Never    FAMILY HISTORY:  History reviewed. No pertinent family history.  DRUG ALLERGIES:   Allergies  Allergen Reactions  . Exelon [Rivastigmine Tartrate]     Patient unsure of reaction  . Hydrocodone-Acetaminophen Nausea Only    Sometimes make her nausea   . Other Itching and Rash    Anything metal on skin will cause rash and itching    REVIEW OF SYSTEMS:   Review of Systems  Constitutional: Positive for chills and malaise/fatigue. Negative for diaphoresis, fever and weight loss.  HENT: Negative for congestion, sinus pain and sore throat.   Eyes: Negative for blurred vision and double vision.  Respiratory: Negative for sputum production, shortness of breath and wheezing.   Cardiovascular:  Negative for chest pain and palpitations.  Gastrointestinal: Positive for nausea. Negative for vomiting.  Genitourinary: Positive for dysuria, frequency and urgency. Negative for hematuria.  Musculoskeletal: Negative for falls and myalgias.  Skin: Negative for itching and rash.  Neurological: Positive for weakness. Negative for dizziness, focal weakness and headaches.  Psychiatric/Behavioral: Negative for depression.   MEDICATIONS AT HOME:   Prior to Admission medications   Medication Sig Start Date  End Date Taking? Authorizing Provider  Cholecalciferol (VITAMIN D-1000 MAX ST) 1000 units tablet Take 1,000 Units by mouth daily.   Yes [provider]  fluticasone (FLONASE) 50 MCG/ACT nasal spray Place 1 spray into both nostrils daily.   Yes [provider]  gabapentin (NEURONTIN) 300 MG capsule Take 300 mg by mouth 3 (three) times daily.   Yes [provider]  hydrochlorothiazide (HYDRODIURIL) 25 MG tablet Take 25 mg by mouth daily. 11/29/18  Yes [provider]  HYDROcodone-acetaminophen (NORCO/VICODIN) 5-325 MG tablet Take 1-2 tablets by mouth every 6 (six) hours as needed for severe pain (pain score 4-6). 11/04/18  Yes Salary, Evelena AsaMontell D, MD  omeprazole (PRILOSEC) 40 MG capsule Take 40 mg by mouth daily. 09/16/18 09/16/19 Yes [provider]  predniSONE (DELTASONE) 5 MG tablet Take 5 mg by mouth daily. 09/05/18  Yes [provider]  propranolol (INDERAL) 20 MG tablet Take 20 mg by mouth daily. 07/27/18  Yes [provider]  Propylene Glycol (SYSTANE COMPLETE OP) Place 1 drop into both eyes daily.   Yes [provider]  vitamin B-12 (CYANOCOBALAMIN) 1000 MCG tablet Take 1,000 mcg by mouth daily.   Yes [provider]  XARELTO 15 MG TABS tablet Take 15 mg by mouth 2 (two) times daily. 12/02/18  Yes [provider]      VITAL SIGNS:  Blood pressure (!) 133/52, pulse (!) 252, temperature 97.7 F (36.5 C), temperature source Oral, resp. rate (!) 21, height 5' (1.524 m), weight 48.1 kg, SpO2 (!) 72 %.  PHYSICAL EXAMINATION:  Physical Exam  GENERAL:  85 y.o.-year-old ill-appearing patient lying in the bed with no acute distress.  EYES: Pupils equal, round, reactive to light and accommodation. No scleral icterus. Extraocular muscles intact.  HEENT: Head atraumatic, normocephalic. Oropharynx and nasopharynx clear.  NECK:  Supple, no jugular venous distention. No thyroid enlargement, no tenderness.  LUNGS: Normal  breath sounds bilaterally, no wheezing, rales,rhonchi or crepitation. No use of accessory muscles of respiration.  CARDIOVASCULAR: Regular rate and rhythm, S1, S2 normal. No murmurs, rubs, or gallops.   ABDOMEN: Soft, nondistended, positive low abdominal tenderness. Bowel sounds present. No organomegaly or mass.  EXTREMITIES: No pedal edema, cyanosis, or clubbing.  NEUROLOGIC: Cranial nerves II through XII are intact.  General weakness. Sensation intact. Gait not checked.  PSYCHIATRIC: The patient is alert and oriented x 3.  Normal affect and good eye contact. SKIN: No obvious rash, lesion, or ulcer.   LABORATORY PANEL:   CBC Recent Labs  Lab 12/27/18 1456  WBC 11.9*  HGB 12.4  HCT 38.4  PLT 148*   ------------------------------------------------------------------------------------------------------------------  Chemistries  Recent Labs  Lab 12/27/18 1456  NA 136  K 3.3*  CL 108  CO2 19*  GLUCOSE 117*  BUN 20  CREATININE 0.80  CALCIUM 7.8*   ------------------------------------------------------------------------------------------------------------------  Cardiac Enzymes No results for input(s): TROPONINI in the last 168 hours. ------------------------------------------------------------------------------------------------------------------  RADIOLOGY:  Dg Chest Portable 1 View  Result Date: 12/27/2018 CLINICAL DATA:  Generalized weakness for 3 days. History of pneumonia. EXAM: PORTABLE  CHEST 1 VIEW COMPARISON:  11/01/2018 FINDINGS: Heart size is normal. Aortic atherosclerosis. There is no pleural effusion or edema identified. No airspace opacities identified. Lung volumes appear low. IMPRESSION: 1. No acute cardiopulmonary abnormalities. 2. Aortic Atherosclerosis (ICD10-I70.0). Electronically Signed   By: Kerby Moors M.D.   On: 12/27/2018 15:59      IMPRESSION AND PLAN:   1.  Urinary tract infection - IV Rocephin - Normal saline at 75 cc/h -Repeat BMP and CBC  in the a.m. - We will review results of urine culture and adjust treatment accordingly  2.  Generalized weakness - Likely secondary to #1 -We will consult physical therapy for supportive care during hospitalization -Patient may need social service consult for physical therapy follow-up at home  3.  History of DVT - Xarelto continued  4.  Dementia - Aware, mild, daughter at bedside  DVT and PPI prophylaxis    All the records are reviewed and case discussed with ED provider. The plan of care was discussed in details with the patient (and family). I answered all questions. The patient agreed to proceed with the above mentioned plan. Further management will depend upon hospital course.   CODE STATUS: Full code  TOTAL TIME TAKING CARE OF THIS PATIENT:34minutes.    [place] on 12/27/2018 at 7:30 PM  Pager - [phone]  After 6pm go to [email or web address] - Proofreader  [facility]  Office  [phone]  CC: Primary care physician; Rusty Aus, MD   Note: This dictation was prepared with Dragon dictation along with smaller phrase technology. Any transcriptional errors that result from this process are unintentional.

## 2018-12-28 DIAGNOSIS — L8990 Pressure ulcer of unspecified site, unspecified stage: Secondary | ICD-10-CM | POA: Insufficient documentation

## 2018-12-28 LAB — CBC
HCT: 33.9 % — ABNORMAL LOW (ref 36.0–46.0)
Hemoglobin: 11.3 g/dL — ABNORMAL LOW (ref 12.0–15.0)
MCH: 31 pg (ref 26.0–34.0)
MCHC: 33.3 g/dL (ref 30.0–36.0)
MCV: 93.1 fL (ref 80.0–100.0)
Platelets: 124 10*3/uL — ABNORMAL LOW (ref 150–400)
RBC: 3.64 MIL/uL — ABNORMAL LOW (ref 3.87–5.11)
RDW: 13.3 % (ref 11.5–15.5)
WBC: 8.4 10*3/uL (ref 4.0–10.5)
nRBC: 0 % (ref 0.0–0.2)

## 2018-12-28 LAB — BASIC METABOLIC PANEL
Anion gap: 6 (ref 5–15)
BUN: 21 mg/dL (ref 8–23)
CO2: 20 mmol/L — ABNORMAL LOW (ref 22–32)
Calcium: 8 mg/dL — ABNORMAL LOW (ref 8.9–10.3)
Chloride: 112 mmol/L — ABNORMAL HIGH (ref 98–111)
Creatinine, Ser: 0.66 mg/dL (ref 0.44–1.00)
GFR calc Af Amer: >60 mL/min (ref >60)
GFR calc non Af Amer: >60 mL/min (ref >60)
Glucose, Bld: 101 mg/dL — ABNORMAL HIGH (ref 70–99)
Potassium: 3.4 mmol/L — ABNORMAL LOW (ref 3.5–5.1)
Sodium: 138 mmol/L (ref 135–145)

## 2018-12-28 LAB — SARS CORONAVIRUS 2 (TAT 6-24 HRS): SARS Coronavirus 2: NEGATIVE

## 2018-12-28 NOTE — Progress Notes (Signed)
PT Cancellation Note  Patient Details Name: Lauren Roman MRN: [id number] DOB: [birth year]   Cancelled Treatment:    Reason Eval/Treat Not Completed: Patient declined, no reason specified.  PT consult received.  Chart reviewed.  Upon PT arrival, pt's body noted to be tremoring resting in bed and pt declined any PT d/t not feeling well  (nurse present and aware of all of this).  Will re-attempt PT at a later date/time.  Leitha Bleak, PT 12/28/18, 9:41 AM [phone]

## 2018-12-28 NOTE — Evaluation (Signed)
Physical Therapy Evaluation Patient Details Name: Lauren PickettDorothy G Roman MRN: [id number] DOB: 1928/08/13 Today's Date: 12/28/2018   History of Present Illness  Pt is an 85 y.o. female presenting to hospital 12/27/18 with generalized weakness x2-3 days, burning with urination and mild suprapubic pain, and recent diagnosis PNA and L LE DVT (12/02/18).  Pt admitted with UTI, generalized weakness, and essential tremors.  PMH includes dementia, HA, Hep B, myasthenia gravis, essential tremors, and s/p L LE ORIF 09/20/2018.  Clinical Impression  Prior to hospital admission, pt reports being ambulatory with walker.  Pt also reports living with family member in 1 level home with steps to enter with railing.  Currently pt is max assist semi-supine to/from sitting.  Pt able to sit on edge of bed but requiring intermittent assist for sitting balance d/t posterior lean with fatigue.  Pt demonstrating generalized weakness with decreased activity tolerance.  Pt declined to attempt to stand d/t feeling too weak to try and also reported mild dizziness in sitting (nurse notified).  Pt would benefit from skilled PT to address noted impairments and functional limitations (see below for any additional details).  Upon hospital discharge, recommend pt discharge to STR.    Follow Up Recommendations SNF    Equipment Recommendations  Rolling walker with 5" wheels;Wheelchair (measurements PT);Wheelchair cushion (measurements PT)(youth sized)    Recommendations for Other Services OT consult     Precautions / Restrictions Precautions Precautions: Fall Restrictions Weight Bearing Restrictions: No      Mobility  Bed Mobility Overal bed mobility: Needs Assistance Bed Mobility: Supine to Sit;Sit to Supine     Supine to sit: Max assist;HOB elevated Sit to supine: Max assist;HOB elevated   General bed mobility comments: assist for trunk and B LE's; vc's for technique  Transfers                 General transfer  comment: Pt declined d/t feeling too weak to try  Ambulation/Gait             General Gait Details: pt declined d/t feeling too weak to try  Stairs            Wheelchair Mobility    Modified Rankin (Stroke Patients Only)       Balance Overall balance assessment: Needs assistance Sitting-balance support: Bilateral upper extremity supported;Feet supported Sitting balance-Leahy Scale: Poor Sitting balance - Comments: intermittent posterior lean requiring min to mod assist for upright sitting balance                                     Pertinent Vitals/Pain Pain Assessment: No/denies pain  Vitals (HR and O2 on room air) stable and WFL throughout treatment session.    Home Living Family/patient expects to be discharged to:: Private residence Living Arrangements: Other relatives(Brother)   Type of Home: House Home Access: Stairs to enter Entrance Stairs-Rails: Right(from carport entrance) Entrance Stairs-Number of Steps: 3 carport; 4 at front door Home Layout: One level Home Equipment: Walker - 2 wheels;Grab bars - tub/shower;Grab bars - toilet;Cane - single point      Prior Function Level of Independence: Needs assistance   Gait / Transfers Assistance Needed: Pt reports ambulating with walker at home  ADL's / Homemaking Assistance Needed: Per PT note June 2020 "Able to dress herself, bird bath, daughter assists with chores, cooking, brother manages medications"        Hand Dominance  Extremity/Trunk Assessment   Upper Extremity Assessment Upper Extremity Assessment: Generalized weakness    Lower Extremity Assessment Lower Extremity Assessment: Generalized weakness    Cervical / Trunk Assessment Cervical / Trunk Assessment: Kyphotic  Communication   Communication: HOH  Cognition Arousal/Alertness: Awake/alert Behavior During Therapy: WFL for tasks assessed/performed Overall Cognitive Status: Within Functional Limits for  tasks assessed                                 General Comments: PMH of dementia      General Comments   Nursing cleared pt for participation in physical therapy.  Pt agreeable to PT session.    Exercises  sitting edge of bed x8 minutes   Assessment/Plan    PT Assessment Patient needs continued PT services  PT Problem List Decreased strength;Decreased activity tolerance;Decreased balance;Decreased mobility;Decreased knowledge of use of DME;Decreased knowledge of precautions       PT Treatment Interventions DME instruction;Gait training;Stair training;Functional mobility training;Therapeutic activities;Therapeutic exercise;Balance training;Patient/family education    PT Goals (Current goals can be found in the Care Plan section)  Acute Rehab PT Goals Patient Stated Goal: to improve strength and mobility PT Goal Formulation: With patient Time For Goal Achievement: 01/11/19 Potential to Achieve Goals: Fair    Frequency Min 2X/week   Barriers to discharge Decreased caregiver support      Co-evaluation               AM-PAC PT "6 Clicks" Mobility  Outcome Measure Help needed turning from your back to your side while in a flat bed without using bedrails?: A Little Help needed moving from lying on your back to sitting on the side of a flat bed without using bedrails?: A Lot Help needed moving to and from a bed to a chair (including a wheelchair)?: A Lot Help needed standing up from a chair using your arms (e.g., wheelchair or bedside chair)?: A Lot Help needed to walk in hospital room?: Total Help needed climbing 3-5 steps with a railing? : Total 6 Click Score: 11    End of Session   Activity Tolerance: Patient limited by fatigue Patient left: in bed;with call bell/phone within reach;with bed alarm set Nurse Communication: Mobility status;Precautions PT Visit Diagnosis: Other abnormalities of gait and mobility (R26.89);Muscle weakness (generalized)  (M62.81);Difficulty in walking, not elsewhere classified (R26.2)    Time: 1348-1410 PT Time Calculation (min) (ACUTE ONLY): 22 min   Charges:   PT Evaluation $PT Eval Low Complexity: 1 Low PT Treatments $Therapeutic Exercise: 8-22 mins       Leitha Bleak, PT 12/28/18, 2:26 PM [phone]

## 2018-12-28 NOTE — Progress Notes (Signed)
[employer] - [place] at [facility]   PATIENT NAME: Lauren Roman    MR#:  [id number]  DATE OF BIRTH:  1928-09-27  SUBJECTIVE:  CHIEF COMPLAINT:   Chief Complaint  Patient presents with  . Weakness   -Patient is extremely hard of hearing.  Feels a little better than yesterday.  Still very weak. -Urine is dark, improved dysuria  REVIEW OF SYSTEMS:  Review of Systems  Constitutional: Positive for fever and malaise/fatigue. Negative for chills.  HENT: Negative for congestion, ear discharge, hearing loss and nosebleeds.   Eyes: Negative for blurred vision and double vision.  Respiratory: Negative for cough, shortness of breath and wheezing.   Cardiovascular: Negative for chest pain and palpitations.  Gastrointestinal: Negative for abdominal pain, constipation, diarrhea, nausea and vomiting.  Genitourinary: Positive for dysuria and frequency.  Musculoskeletal: Negative for myalgias.  Neurological: Positive for weakness. Negative for dizziness, focal weakness, seizures and headaches.  Psychiatric/Behavioral: Negative for depression.    DRUG ALLERGIES:   Allergies  Allergen Reactions  . Exelon [Rivastigmine Tartrate]     Patient unsure of reaction  . Hydrocodone-Acetaminophen Nausea Only    Sometimes make her nausea   . Other Itching and Rash    Anything metal on skin will cause rash and itching    VITALS:  Blood pressure (!) 124/50, pulse 67, temperature 98.5 F (36.9 C), temperature source Oral, resp. rate 19, height 5' (1.524 m), weight 46.8 kg, SpO2 95 %.  PHYSICAL EXAMINATION:  Physical Exam   GENERAL:  85 y.o.-year-old elderly patient lying in the bed with no acute distress.  Very hard of hearing EYES: Pupils equal, round, reactive to light and accommodation. No scleral icterus. Extraocular muscles intact.  HEENT: Head atraumatic, normocephalic. Oropharynx and nasopharynx clear.  NECK:  Supple, no jugular venous distention. No thyroid  enlargement, no tenderness.  LUNGS: Normal breath sounds bilaterally, no wheezing, rales,rhonchi or crepitation. No use of accessory muscles of respiration.  Decreased bibasilar breath sounds CARDIOVASCULAR: S1, S2 normal. No murmurs, rubs, or gallops.  ABDOMEN: Soft, nontender, nondistended. Bowel sounds present. No organomegaly or mass.  EXTREMITIES: No pedal edema, cyanosis, or clubbing.  NEUROLOGIC: Cranial nerves II through XII are intact. Muscle strength 5/5 in all extremities. Sensation intact. Gait not checked.  Extremely hard of hearing PSYCHIATRIC: The patient is alert and oriented x 3.  SKIN: No obvious rash, lesion, or ulcer.    LABORATORY PANEL:   CBC Recent Labs  Lab 12/28/18 0622  WBC 8.4  HGB 11.3*  HCT 33.9*  PLT 124*   ------------------------------------------------------------------------------------------------------------------  Chemistries  Recent Labs  Lab 12/28/18 0622  NA 138  K 3.4*  CL 112*  CO2 20*  GLUCOSE 101*  BUN 21  CREATININE 0.66  CALCIUM 8.0*   ------------------------------------------------------------------------------------------------------------------  Cardiac Enzymes No results for input(s): TROPONINI in the last 168 hours. ------------------------------------------------------------------------------------------------------------------  RADIOLOGY:  Dg Chest Portable 1 View  Result Date: 12/27/2018 CLINICAL DATA:  Generalized weakness for 3 days. History of pneumonia. EXAM: PORTABLE CHEST 1 VIEW COMPARISON:  11/01/2018 FINDINGS: Heart size is normal. Aortic atherosclerosis. There is no pleural effusion or edema identified. No airspace opacities identified. Lung volumes appear low. IMPRESSION: 1. No acute cardiopulmonary abnormalities. 2. Aortic Atherosclerosis (ICD10-I70.0). Electronically Signed   By: Signa Kellaylor  Stroud M.D.   On: 12/27/2018 15:59    EKG:   Orders placed or performed during the hospital encounter of 12/27/18   . EKG 12-Lead  . EKG 12-Lead    ASSESSMENT AND PLAN:  85 year old female with past medical history significant for myasthenia, hand tremors, mild dementia, hyperlipidemia, history of DVT on Xarelto and hepatitis C presents to hospital from home secondary to worsening weakness.  1.  Acute cystitis-follow  urine cultures -WBC has improved. -Continue Rocephin  2.  Left leg DVT-continue Xarelto -Was noted to have left leg DVT after left hip fracture in June 2020.  Plan to use Xarelto for 3 months.  3.  Essential tremors-on propranolol  4.  Osteoarthritis and neuropathy-on low-dose prednisone, gabapentin  5.  DVT prophylaxis on Xarelto  Physical therapy consult Tried calling daughter Juliann Pulse- unable to leave a voicemail.   All the records are reviewed and case discussed with Care Management/Social Workerr. Management plans discussed with the patient, family and they are in agreement.  CODE STATUS: Full Code  TOTAL TIME TAKING CARE OF THIS PATIENT: 38 minutes.   POSSIBLE D/C IN 1-2 DAYS, DEPENDING ON CLINICAL CONDITION.   Gladstone Lighter M.D on 12/28/2018 at 11:56 AM  Between 7am to 6pm - Pager - [phone]  After 6pm go to [email or web address] - password [email or web address] [facility]  Office  [phone]  CC: Primary care physician; Rusty Aus, MD

## 2018-12-28 NOTE — Plan of Care (Signed)
  Problem: Urinary Elimination: Goal: Signs and symptoms of infection will decrease Outcome: Not Progressing   Problem: Education: Goal: Knowledge of General Education information will improve Description: Including pain rating scale, medication(s)/side effects and non-pharmacologic comfort measures Outcome: Progressing   Problem: Health Behavior/Discharge Planning: Goal: Ability to manage health-related needs will improve Outcome: Progressing   Problem: Clinical Measurements: Goal: Ability to maintain clinical measurements within normal limits will improve Outcome: Not Progressing Goal: Will remain free from infection Outcome: Not Progressing Goal: Diagnostic test results will improve Outcome: Not Progressing Goal: Respiratory complications will improve Outcome: Not Applicable Goal: Cardiovascular complication will be avoided Outcome: Progressing   Problem: Activity: Goal: Risk for activity intolerance will decrease Outcome: Not Progressing   Problem: Nutrition: Goal: Adequate nutrition will be maintained Outcome: Progressing   Problem: Coping: Goal: Level of anxiety will decrease Outcome: Progressing   Problem: Elimination: Goal: Will not experience complications related to bowel motility Outcome: Progressing Goal: Will not experience complications related to urinary retention Outcome: Not Progressing   Problem: Pain Managment: Goal: General experience of comfort will improve Outcome: Progressing   Problem: Safety: Goal: Ability to remain free from injury will improve Outcome: Progressing   Problem: Skin Integrity: Goal: Risk for impaired skin integrity will decrease Outcome: Progressing

## 2018-12-29 LAB — BASIC METABOLIC PANEL
Anion gap: 6 (ref 5–15)
BUN: 15 mg/dL (ref 8–23)
CO2: 18 mmol/L — ABNORMAL LOW (ref 22–32)
Calcium: 8.1 mg/dL — ABNORMAL LOW (ref 8.9–10.3)
Chloride: 111 mmol/L (ref 98–111)
Creatinine, Ser: 0.71 mg/dL (ref 0.44–1.00)
GFR calc Af Amer: >60 mL/min (ref >60)
GFR calc non Af Amer: >60 mL/min (ref >60)
Glucose, Bld: 109 mg/dL — ABNORMAL HIGH (ref 70–99)
Potassium: 3 mmol/L — ABNORMAL LOW (ref 3.5–5.1)
Sodium: 135 mmol/L (ref 135–145)

## 2018-12-29 LAB — CBC
HCT: 35 % — ABNORMAL LOW (ref 36.0–46.0)
Hemoglobin: 11.5 g/dL — ABNORMAL LOW (ref 12.0–15.0)
MCH: 30.5 pg (ref 26.0–34.0)
MCHC: 32.9 g/dL (ref 30.0–36.0)
MCV: 92.8 fL (ref 80.0–100.0)
Platelets: 129 10*3/uL — ABNORMAL LOW (ref 150–400)
RBC: 3.77 MIL/uL — ABNORMAL LOW (ref 3.87–5.11)
RDW: 13.2 % (ref 11.5–15.5)
WBC: 7.8 10*3/uL (ref 4.0–10.5)
nRBC: 0 % (ref 0.0–0.2)

## 2018-12-29 MED ORDER — RIVAROXABAN 20 MG PO TABS
20.0000 mg | ORAL_TABLET | Freq: Every day | ORAL | Status: DC
  Administered 2018-12-29 – 2018-12-30 (×2): 20 mg via ORAL
  Filled 2018-12-29 (×2): qty 1

## 2018-12-29 MED ORDER — BISACODYL 10 MG RE SUPP
10.0000 mg | Freq: Every day | RECTAL | Status: DC | PRN
  Administered 2018-12-29 – 2018-12-30 (×2): 10 mg via RECTAL
  Filled 2018-12-29 (×2): qty 1

## 2018-12-29 MED ORDER — POTASSIUM CHLORIDE CRYS ER 20 MEQ PO TBCR
40.0000 meq | EXTENDED_RELEASE_TABLET | Freq: Once | ORAL | Status: AC
  Administered 2018-12-29: 40 meq via ORAL
  Filled 2018-12-29: qty 2

## 2018-12-29 NOTE — TOC Initial Note (Signed)
Transition of Care [facility]) - Initial/Assessment Note    Patient Details  Name: Lauren Roman MRN: [id number] Date of Birth: 23-Jun-1928  Transition of Care [facility]) CM/SW Contact:    Elliot Gurney [place], [place] Phone Number: 12/29/2018, 2:23 PM  Clinical Narrative:                 Patient is a 85 year old female with past medical history significant for myasthenia, hand tremors, mild dementia, hyperlipidemia, history of DVT on Xarelto and hepatitis C. Patient presents to hospital from home secondary to worsening weakness.PT recommending SNF at this time. Patient hesitant initially stating a negative experience at a SNF previously, however states that she will do whatever is recommended to get better.  CSW met with patient at bedside to discuss discharge plan and SNF referral process.  Patient states that she lives with her younger brother who helps provide care for her. Patient also has a daughter who lives locally and helps when available as well. This social worker attempted to call patient's daughter to discuss SNF process as well, however there was no answer and no voicemail set up to leave a message.  This social worker will complete the FL2 and send out referrals for bed offers.    Expected Discharge Plan: [place]     Patient Goals and CMS Choice Patient states their goals for this hospitalization and ongoing recovery are:: "I will do whatever the doctor thinks is best for me to get better"      Expected Discharge Plan and Services Expected Discharge Plan: [place] In-house Referral: Clinical Social Work     Living arrangements for the past 2 months: Single Family Home                                      Prior Living Arrangements/Services Living arrangements for the past 2 months: Single Family Home Lives with:: Siblings Patient language and need for interpreter reviewed:: No Do you feel safe going back to the place where you  live?: Yes      Need for Family Participation in Patient Care: Yes (Comment) Care giver support system in place?: Yes (comment)   Criminal Activity/Legal Involvement Pertinent to Current Situation/Hospitalization: No - Comment as needed  Activities of Daily Living Home Assistive Devices/Equipment: Cane (specify quad or straight) ADL Screening (condition at time of admission) Patient's cognitive ability adequate to safely complete daily activities?: Yes Is the patient deaf or have difficulty hearing?: Yes Does the patient have difficulty seeing, even when wearing glasses/contacts?: Yes Does the patient have difficulty concentrating, remembering, or making decisions?: No Patient able to express need for assistance with ADLs?: Yes Does the patient have difficulty dressing or bathing?: Yes Independently performs ADLs?: No Communication: Independent Dressing (OT): Needs assistance Is this a change from baseline?: Pre-admission baseline Grooming: Independent Feeding: Independent Bathing: Needs assistance Is this a change from baseline?: Pre-admission baseline Toileting: Needs assistance Is this a change from baseline?: Pre-admission baseline In/Out Bed: Needs assistance Is this a change from baseline?: Pre-admission baseline Walks in Home: Independent with device (comment) Does the patient have difficulty walking or climbing stairs?: Yes Weakness of Legs: Both Weakness of Arms/Hands: Both  Permission Sought/Granted Permission sought to share information with : Family Supports Permission granted to share information with : Yes, Verbal Permission Granted  Share Information with NAME: Genelle Gather daughter     Permission granted to  share info w Relationship: daughter  Permission granted to share info w Contact Information: [phone]  Emotional Assessment Appearance:: Appears stated age Attitude/Demeanor/Rapport: Engaged Affect (typically observed): Pleasant Orientation: : Oriented  to Self, Oriented to Place, Oriented to  Time, Oriented to Situation Alcohol / Substance Use: Not Applicable Psych Involvement: No (comment)  Admission diagnosis:  Lower urinary tract infectious disease [N39.0] Weakness [R53.1] Patient Active Problem List   Diagnosis Date Noted  . Pressure injury of skin 12/28/2018  . UTI (urinary tract infection) 12/27/2018  . HAP (hospital-acquired pneumonia) 11/01/2018  . Closed left hip fracture ([place]) 09/19/2018  . Lumbar radiculopathy 01/30/2018   PCP:  Rusty Aus, MD Pharmacy:   [place], [place] - [address] [place] [address] Phone: [phone] Fax: [phone]     Social Determinants of Health (SDOH) Interventions    Readmission Risk Interventions No flowsheet data found.

## 2018-12-29 NOTE — NC FL2 (Signed)
[place] LEVEL OF CARE SCREENING TOOL     IDENTIFICATION  Patient Name: Lauren Roman Birthdate: 08-26-1928 Sex: female Admission Date (Current Location): 12/27/2018  [place] and [place] Number:  Engineering geologist and Address:  [facility], [address], [place], [place] [address]      Provider Number:    Attending Physician Name and Address:  Fritzi Mandes, MD  Relative Name and Phone Number:  Garnet Sierras [phone]    Current Level of Care: Hospital Recommended Level of Care: [place] Prior Approval Number:    Date Approved/Denied:   PASRR Number: [id number]  Discharge Plan: SNF    Current Diagnoses: Patient Active Problem List   Diagnosis Date Noted  . Pressure injury of skin 12/28/2018  . UTI (urinary tract infection) 12/27/2018  . HAP (hospital-acquired pneumonia) 11/01/2018  . Closed left hip fracture ([place]) 09/19/2018  . Lumbar radiculopathy 01/30/2018    Orientation RESPIRATION BLADDER Height & Weight     Self, Time, Situation  Normal Incontinent Weight: 103 lb 2.8 oz (46.8 kg) Height:  5' (152.4 cm)  BEHAVIORAL SYMPTOMS/MOOD NEUROLOGICAL BOWEL NUTRITION STATUS      Incontinent Diet  AMBULATORY STATUS COMMUNICATION OF NEEDS Skin   Extensive Assist Verbally Skin abrasions                       Personal Care Assistance Level of Assistance  Feeding, Dressing, Bathing Bathing Assistance: Maximum assistance   Dressing Assistance: Maximum assistance     Functional Limitations Info  Hearing, Sight, Speech Sight Info: Adequate Hearing Info: Impaired Speech Info: Adequate    SPECIAL CARE FACTORS FREQUENCY  PT (By licensed PT), OT (By licensed OT)     PT Frequency: 5x per week OT Frequency: 5x per week            Contractures Contractures Info: Not present    Additional Factors Info                  Current Medications (12/29/2018):  This is the current hospital  active medication list Current Facility-Administered Medications  Medication Dose Route Frequency Provider Last Rate Last Dose  . 0.9 %  sodium chloride infusion   Intravenous Continuous Seals, Theo Dills, NP 75 mL/hr at 12/29/18 1411    . acetaminophen (TYLENOL) tablet 650 mg  650 mg Oral Q6H PRN Gardiner Barefoot H, NP   650 mg at 12/27/18 2240   Or  . acetaminophen (TYLENOL) suppository 650 mg  650 mg Rectal Q6H PRN Seals, Theo Dills, NP      . bisacodyl (DULCOLAX) suppository 10 mg  10 mg Rectal Daily PRN Fritzi Mandes, MD   10 mg at 12/29/18 1026  . cefTRIAXone (ROCEPHIN) 1 g in sodium chloride 0.9 % 100 mL IVPB  1 g Intravenous Q24H Seals, Theo Dills, NP   Stopped at 12/28/18 1835  . fluticasone (FLONASE) 50 MCG/ACT nasal spray 1 spray  1 spray Each Nare Daily Seals, Angela H, NP   1 spray at 12/29/18 1413  . gabapentin (NEURONTIN) capsule 300 mg  300 mg Oral TID Gardiner Barefoot H, NP   300 mg at 12/29/18 1022  . ondansetron (ZOFRAN) tablet 4 mg  4 mg Oral Q6H PRN Seals, Theo Dills, NP       Or  . ondansetron (ZOFRAN) injection 4 mg  4 mg Intravenous Q6H PRN Seals, Theo Dills, NP      . pantoprazole (PROTONIX) EC  tablet 40 mg  40 mg Oral Daily Seals, Angela H, NP   40 mg at 12/29/18 1022  . polyethylene glycol (MIRALAX / GLYCOLAX) packet 17 g  17 g Oral Daily PRN Janeann MerlSeals, Angela H, NP   17 g at 12/28/18 2059  . polyvinyl alcohol (LIQUIFILM TEARS) 1.4 % ophthalmic solution 1 drop  1 drop Both Eyes Daily Seals, Angela H, NP   1 drop at 12/29/18 1414  . potassium chloride SA (K-DUR) CR tablet 40 mEq  40 mEq Oral Once Bertram SavinSimpson, Michael L, RPH      . predniSONE (DELTASONE) tablet 5 mg  5 mg Oral Daily Seals, Angela H, NP   5 mg at 12/29/18 1022  . propranolol (INDERAL) tablet 20 mg  20 mg Oral Daily Seals, Angela H, NP   20 mg at 12/29/18 1022  . rivaroxaban (XARELTO) tablet 20 mg  20 mg Oral Q supper Enedina FinnerPatel, Sona, MD      . sodium chloride flush (NS) 0.9 % injection 3 mL  3 mL Intravenous Q12H Seals, Milas KocherAngela H, NP    3 mL at 12/28/18 2104  . vitamin B-12 (CYANOCOBALAMIN) tablet 1,000 mcg  1,000 mcg Oral Daily Seals, Milas Kocherngela H, NP   1,000 mcg at 12/29/18 1022     Discharge Medications: Please see discharge summary for a list of discharge medications.  Relevant Imaging Results:  Relevant Lab Results:   Additional Information SSN [id number]  Verna CzechLand, Ludivina Guymon [place], [place]CS[place]

## 2018-12-29 NOTE — Progress Notes (Signed)
Contact number for daughter: [phone]

## 2018-12-29 NOTE — Progress Notes (Signed)
[facility] [employer] - [place] at [facility]   PATIENT NAME: Lucy ChrisDorothy Coe    MR#:  [id number]  DATE OF BIRTH:  [birth year]  SUBJECTIVE:   -Patient is extremely hard of hearing.  Feels a little better than yesterday.  Still very weak. -c/o constipation  REVIEW OF SYSTEMS:  Review of Systems  Constitutional: Positive for fever and malaise/fatigue. Negative for chills.  HENT: Negative for congestion, ear discharge, hearing loss and nosebleeds.   Eyes: Negative for blurred vision and double vision.  Respiratory: Negative for cough, shortness of breath and wheezing.   Cardiovascular: Negative for chest pain and palpitations.  Gastrointestinal: Positive for constipation. Negative for abdominal pain, diarrhea, nausea and vomiting.  Musculoskeletal: Negative for myalgias.  Neurological: Positive for weakness. Negative for dizziness, focal weakness, seizures and headaches.  Psychiatric/Behavioral: Negative for depression.    DRUG ALLERGIES:   Allergies  Allergen Reactions  . Exelon [Rivastigmine Tartrate]     Patient unsure of reaction  . Hydrocodone-Acetaminophen Nausea Only    Sometimes make her nausea   . Other Itching and Rash    Anything metal on skin will cause rash and itching    VITALS:  Blood pressure (!) 159/55, pulse 68, temperature 98.7 F (37.1 C), temperature source Axillary, resp. rate 18, height 5' (1.524 m), weight 46.8 kg, SpO2 95 %.  PHYSICAL EXAMINATION:  Physical Exam   GENERAL:  85 y.o.-year-old elderly patient lying in the bed with no acute distress.  Very hard of hearing EYES: Pupils equal, round, reactive to light and accommodation. No scleral icterus. Extraocular muscles intact.  HEENT: Head atraumatic, normocephalic. Oropharynx and nasopharynx clear.  NECK:  Supple, no jugular venous distention. No thyroid enlargement, no tenderness.  LUNGS: Normal breath sounds bilaterally, no wheezing, rales,rhonchi or crepitation. No use of accessory  muscles of respiration.  Decreased bibasilar breath sounds CARDIOVASCULAR: S1, S2 normal. No murmurs, rubs, or gallops.  ABDOMEN: Soft, nontender, nondistended. Bowel sounds present. No organomegaly or mass.  EXTREMITIES: No pedal edema, cyanosis, or clubbing.  NEUROLOGIC: Cranial nerves II through XII are intact. Muscle strength 5/5 in all extremities. Sensation intact. Gait not checked.  Extremely hard of hearing weak PSYCHIATRIC: The patient is alert and oriented x 3.  SKIN: No obvious rash, lesion, or ulcer.    LABORATORY PANEL:   CBC Recent Labs  Lab 12/29/18 0450  WBC 7.8  HGB 11.5*  HCT 35.0*  PLT 129*   ------------------------------------------------------------------------------------------------------------------  Chemistries  Recent Labs  Lab 12/29/18 0450  NA 135  K 3.0*  CL 111  CO2 18*  GLUCOSE 109*  BUN 15  CREATININE 0.71  CALCIUM 8.1*   ------------------------------------------------------------------------------------------------------------------  Cardiac Enzymes No results for input(s): TROPONINI in the last 168 hours. ------------------------------------------------------------------------------------------------------------------  RADIOLOGY:  Dg Chest Portable 1 View  Result Date: 12/27/2018 CLINICAL DATA:  Generalized weakness for 3 days. History of pneumonia. EXAM: PORTABLE CHEST 1 VIEW COMPARISON:  11/01/2018 FINDINGS: Heart size is normal. Aortic atherosclerosis. There is no pleural effusion or edema identified. No airspace opacities identified. Lung volumes appear low. IMPRESSION: 1. No acute cardiopulmonary abnormalities. 2. Aortic Atherosclerosis (ICD10-I70.0). Electronically Signed   By: Signa Kellaylor  Stroud M.D.   On: 12/27/2018 15:59    EKG:   Orders placed or performed during the hospital encounter of 12/27/18  . EKG 12-Lead  . EKG 12-Lead    ASSESSMENT AND PLAN:   85 year old female with past medical history significant for  myasthenia, hand tremors, mild dementia, hyperlipidemia, history of DVT on Xarelto  and hepatitis C presents to hospital from home secondary to worsening weakness.  1.  Acute cystitis-follow  urine cultures--GNR -WBC has improved. -Continue Rocephin -d/c ivf  2.  Left leg DVT-continue Xarelto -Was noted to have left leg DVT after left hip fracture in June 2020.  Plan to use Xarelto for 3 months.  3.  Essential tremors-on propranolol  4.  Osteoarthritis and neuropathy-on low-dose prednisone, gabapentin  5.  DVT prophylaxis on Xarelto  6.Constipation--dulcolax. Enema if needed  Physical therapy consult--recommends rehab. Pt wants to go home with brother. Will d/w dter Juliann Pulse [place]--tired calling--no answer, no VM Tried calling daughter Juliann Pulse- unable to leave a voicemail.   CODE STATUS: Full Code  TOTAL TIME TAKING CARE OF THIS PATIENT: 38 minutes.   POSSIBLE D/C IN 1-2 DAYS, DEPENDING ON CLINICAL CONDITION.   Fritzi Mandes M.D on 12/29/2018 at 10:24 AM  Between 7am to 6pm - Pager - [phone]  After 6pm go to [email or web address] - password [email or web address] [facility]  Office  [phone]  CC: Primary care physician; Rusty Aus, MD

## 2018-12-29 NOTE — Progress Notes (Signed)
Pharmacy Electrolyte Monitoring Consult:  Pharmacy consulted to assist in monitoring and replacing electrolytes in this 85 y.o. female admitted on 12/27/2018 with Weakness   Labs:  Sodium (mmol/L)  Date Value  12/29/2018 135   Potassium (mmol/L)  Date Value  12/29/2018 3.0 (L)   Calcium (mg/dL)  Date Value  12/29/2018 8.1 (L)   Albumin (g/dL)  Date Value  08/28/2018 4.2    Assessment/Plan: -NS at 76mL/hr.  -Potassium 71mEq PO x 1. BMP/Magnesium with am labs.  -Will replace to maintain electrolytes within normal limits.   Pharmacy will continue to monitor and adjust per consult.   Simpson,Michael L 12/29/2018 2:59 PM

## 2018-12-30 LAB — BASIC METABOLIC PANEL
Anion gap: 14 (ref 5–15)
BUN: 16 mg/dL (ref 8–23)
CO2: 22 mmol/L (ref 22–32)
Calcium: 8.6 mg/dL — ABNORMAL LOW (ref 8.9–10.3)
Chloride: 101 mmol/L (ref 98–111)
Creatinine, Ser: 0.51 mg/dL (ref 0.44–1.00)
GFR calc Af Amer: >60 mL/min (ref >60)
GFR calc non Af Amer: >60 mL/min (ref >60)
Glucose, Bld: 93 mg/dL (ref 70–99)
Potassium: 3.6 mmol/L (ref 3.5–5.1)
Sodium: 137 mmol/L (ref 135–145)

## 2018-12-30 LAB — GLUCOSE, CAPILLARY: Glucose-Capillary: 80 mg/dL (ref 70–99)

## 2018-12-30 LAB — URINE CULTURE: Culture: >=100000 — AB

## 2018-12-30 LAB — ABO/RH: ABO/RH(D): O POS

## 2018-12-30 LAB — SARS CORONAVIRUS 2 BY RT PCR (HOSPITAL ORDER, PERFORMED IN ~~LOC~~ HOSPITAL LAB): SARS Coronavirus 2: NEGATIVE

## 2018-12-30 LAB — MAGNESIUM: Magnesium: 2.1 mg/dL (ref 1.7–2.4)

## 2018-12-30 MED ORDER — CIPROFLOXACIN HCL 500 MG PO TABS: 500.0000 mg | ORAL_TABLET | Freq: Two times a day (BID) | ORAL | Status: DC

## 2018-12-30 MED ORDER — CEPHALEXIN 500 MG PO CAPS: 500.0000 mg | ORAL_CAPSULE | Freq: Two times a day (BID) | ORAL | 0 refills | Status: DC

## 2018-12-30 MED ORDER — CEPHALEXIN 500 MG PO CAPS
500.0000 mg | ORAL_CAPSULE | Freq: Two times a day (BID) | ORAL | Status: DC
  Administered 2018-12-30: 500 mg via ORAL
  Filled 2018-12-30: qty 1

## 2018-12-30 MED ORDER — HYDRALAZINE HCL 20 MG/ML IJ SOLN
10.0000 mg | Freq: Once | INTRAMUSCULAR | Status: AC
  Administered 2018-12-30: 04:00:00 10 mg via INTRAVENOUS
  Filled 2018-12-30: qty 1

## 2018-12-30 MED ORDER — HYDROCODONE-ACETAMINOPHEN 5-325 MG PO TABS: 1.0000 | ORAL_TABLET | Freq: Four times a day (QID) | ORAL | 0 refills | Status: DC | PRN

## 2018-12-30 NOTE — TOC Transition Note (Signed)
Transition of Care [facility]) - CM/SW Discharge Note   Patient Details  Name: Lauren Roman MRN: [id number] Date of Birth: 1928/07/04  [facility]) CM/SW Contact:  Ymani Porcher, Lenice Llamas Phone Number: [phone]  12/30/2018, 5:13 PM   Clinical Narrative: Clinical Social Worker (CSW) met with patient and her daughter Lauren Roman was at bedside. CSW presented SNF bed offers and patient chose [facility]. Per daughter patient was at [facility] 1 month ago. CSW explained that patient will be in her co-pay days at [facility] because she did not have a 60 day wellness period. Per [facility] admissions coordinator at [employer] will pay 10% of the 20% co-pay so patient will owe 10% co-per per day. Patient and daughter aware of above.  Patient is medically stable for D/C to [facility] today. Patient had a negative covid test today 8/17. Per Otila Kluver [facility] liaison patient can come today to room 713. RN will call report and arrange EMS for transport. CSW sent D/C orders to [facility]. Patient and her daughter Lauren Roman are aware of above. Please reconsult if future social work needs arise. CSW signing off.     Final next level of care: Skilled Nursing Facility Barriers to Discharge: Barriers Resolved   Patient Goals and CMS Choice Patient states their goals for this hospitalization and ongoing recovery are:: To D/C to [facility]. CMS Medicare.gov Compare Post Acute Care list provided to:: Other (Comment Required)(Patient's daughter Lauren Roman) Choice offered to / list presented to : Adult Children  Discharge Placement   Existing PASRR number confirmed : 12/29/18          [facility] bed at: [facility] [place] Patient to be transferred to facility by: [facility] Name of family member notified: Patient's daughter Lauren Roman is at bedside and aware of D/C today. Patient and family notified of of transfer: 12/30/18  Discharge Plan and Services In-house Referral: Clinical Social  Work              DME Arranged: N/A         HH Arranged: NA          Social Determinants of Health (SDOH) Interventions     Readmission Risk Interventions No flowsheet data found.

## 2018-12-30 NOTE — Discharge Summary (Signed)
Lauren Roman at [place] NAME: Lauren Roman    MR#:  [id number]  DATE OF BIRTH:  1928/07/11  DATE OF ADMISSION:  12/27/2018 ADMITTING PHYSICIAN: Christel Mormon, MD  DATE OF DISCHARGE: 12/30/2018  PRIMARY CARE PHYSICIAN: Rusty Aus, MD    ADMISSION DIAGNOSIS:  Lower urinary tract infectious disease [N39.0] Weakness [R53.1]  DISCHARGE DIAGNOSIS:  generalized weakness Proteus UTI  SECONDARY DIAGNOSIS:   Past Medical History:  Diagnosis Date  . Arthritis   . Dementia ([place])    mild  . Headache    migraines  . Hepatitis 1952   hep b-no problems since then  . Hyperlipidemia   . Myasthenia gravis ([place])   . Tremor    right hand    HOSPITAL COURSE:   85 year old female with past medical history significant for myasthenia, hand tremors, mild dementia, hyperlipidemia, history of DVT on Xarelto and hepatitis C presents to hospital from home secondary to worsening weakness.  1.  Acute cystitis-follow  urine cultures- proteus Mirabilis -WBC has improved. -Continue Rocephin-- and to PO Keflex -d/c ivf  2.  Left leg DVT-continue Xarelto -Was noted to have left leg DVT after left hip fracture in June 2020.  Plan to use Xarelto for 3 months.  3.  Essential tremors-on propranolol  4.  Osteoarthritis and neuropathy-on low-dose prednisone, gabapentin  5.  DVT prophylaxis on Xarelto  6.Constipation--dulcolax. Enema if needed another large bowel movement per RN  7. Generalized weakness continue physical therapy.  [facility] Discussed with daughter Juliann Pulse in the room. All questions answered.   CONSULTS OBTAINED:    DRUG ALLERGIES:   Allergies  Allergen Reactions  . Exelon [Rivastigmine Tartrate]     Patient unsure of reaction  . Hydrocodone-Acetaminophen Nausea Only    Sometimes make her nausea   . Other Itching and Rash    Anything metal on skin will cause rash and itching     DISCHARGE MEDICATIONS:   Allergies as of 12/30/2018      Reactions   Exelon [rivastigmine Tartrate]    Patient unsure of reaction   Hydrocodone-acetaminophen Nausea Only   Sometimes make her nausea    Other Itching, Rash   Anything metal on skin will cause rash and itching      Medication List    TAKE these medications   cephALEXin 500 MG capsule Commonly known as: KEFLEX Take 1 capsule (500 mg total) by mouth every 12 (twelve) hours.   fluticasone 50 MCG/ACT nasal spray Commonly known as: FLONASE Place 1 spray into both nostrils daily.   gabapentin 300 MG capsule Commonly known as: NEURONTIN Take 300 mg by mouth 3 (three) times daily.   hydrochlorothiazide 25 MG tablet Commonly known as: HYDRODIURIL Take 25 mg by mouth daily.   HYDROcodone-acetaminophen 5-325 MG tablet Commonly known as: NORCO/VICODIN Take 1-2 tablets by mouth every 6 (six) hours as needed for severe pain (pain score 4-6).   omeprazole 40 MG capsule Commonly known as: PRILOSEC Take 40 mg by mouth daily.   predniSONE 5 MG tablet Commonly known as: DELTASONE Take 5 mg by mouth daily.   propranolol 20 MG tablet Commonly known as: INDERAL Take 20 mg by mouth daily.   SYSTANE COMPLETE OP Place 1 drop into both eyes daily.   vitamin B-12 1000 MCG tablet Commonly known as: CYANOCOBALAMIN Take 1,000 mcg by mouth daily.   Vitamin D-1000 Max St 25 MCG (1000 UT) tablet Generic drug:  Cholecalciferol Take 1,000 Units by mouth daily.   Xarelto 15 MG Tabs tablet Generic drug: Rivaroxaban Take 15 mg by mouth 2 (two) times daily.       If you experience worsening of your admission symptoms, develop shortness of breath, life threatening emergency, suicidal or homicidal thoughts you must seek medical attention immediately by calling 911 or calling your MD immediately  if symptoms less severe.  You Must read complete instructions/literature along with all the possible adverse reactions/side  effects for all the Medicines you take and that have been prescribed to you. Take any new Medicines after you have completely understood and accept all the possible adverse reactions/side effects.   Please note  You were cared for by a hospitalist during your hospital stay. If you have any questions about your discharge medications or the care you received while you were in the hospital after you are discharged, you can call the unit and asked to speak with the hospitalist on call if the hospitalist that took care of you is not available. Once you are discharged, your primary care physician will handle any further medical issues. Please note that NO REFILLS for any discharge medications will be authorized once you are discharged, as it is imperative that you return to your primary care physician (or establish a relationship with a primary care physician if you do not have one) for your aftercare needs so that they can reassess your need for medications and monitor your lab values. Today   SUBJECTIVE   Overall improving. Still feels weak  VITAL SIGNS:  Blood pressure (!) 133/52, pulse 72, temperature (!) 97.5 F (36.4 C), temperature source Oral, resp. rate 16, height 5' (1.524 m), weight 46.8 kg, SpO2 96 %.  I/O:    Intake/Output Summary (Last 24 hours) at 12/30/2018 1354 Last data filed at 12/30/2018 1327 Gross per 24 hour  Intake 2720.8 ml  Output 2850 ml  Net -129.2 ml    PHYSICAL EXAMINATION:  GENERAL:  85 y.o.-year-old patient lying in the bed with no acute distress.  EYES: Pupils equal, round, reactive to light and accommodation. No scleral icterus. Extraocular muscles intact.  HEENT: Head atraumatic, normocephalic. Oropharynx and nasopharynx clear.  NECK:  Supple, no jugular venous distention. No thyroid enlargement, no tenderness.  LUNGS: Normal breath sounds bilaterally, no wheezing, rales,rhonchi or crepitation. No use of accessory muscles of respiration.  CARDIOVASCULAR: S1,  S2 normal. No murmurs, rubs, or gallops.  ABDOMEN: Soft, non-tender, non-distended. Bowel sounds present. No organomegaly or mass.  EXTREMITIES: No pedal edema, cyanosis, or clubbing.  NEUROLOGIC: Cranial nerves II through XII are intact. Muscle strength 5/5 in all extremities. Sensation intact. Gait not checked.  PSYCHIATRIC: The patient is alert and oriented x 3.  SKIN: No obvious rash, lesion, or ulcer.   DATA REVIEW:   CBC  Recent Labs  Lab 12/29/18 0450  WBC 7.8  HGB 11.5*  HCT 35.0*  PLT 129*    Chemistries  Recent Labs  Lab 12/30/18 0328  NA 137  K 3.6  CL 101  CO2 22  GLUCOSE 93  BUN 16  CREATININE 0.51  CALCIUM 8.6*  MG 2.1    Microbiology Results   Recent Results (from the past 240 hour(s))  Urine Culture     Status: Abnormal   Collection Time: 12/27/18  3:41 PM   Specimen: Urine, Catheterized  Result Value Ref Range Status   Specimen Description   Final    URINE, CATHETERIZED Performed at [facility],  [address] [place], [place] [address]    Special Requests   Final    NONE Performed at [facility], [address] [place], [place] [address]    Culture >=100,000 COLONIES/mL PROTEUS MIRABILIS (A)  Final   Report Status 12/30/2018 FINAL  Final   Organism ID, Bacteria PROTEUS MIRABILIS (A)  Final      Susceptibility   Proteus mirabilis - MIC*    AMPICILLIN <=2 SENSITIVE Sensitive     CEFAZOLIN <=4 SENSITIVE Sensitive     CEFTRIAXONE <=1 SENSITIVE Sensitive     CIPROFLOXACIN <=0.25 SENSITIVE Sensitive     GENTAMICIN <=1 SENSITIVE Sensitive     IMIPENEM 2 SENSITIVE Sensitive     NITROFURANTOIN 128 RESISTANT Resistant     TRIMETH/SULFA >=320 RESISTANT Resistant     AMPICILLIN/SULBACTAM <=2 SENSITIVE Sensitive     PIP/TAZO <=4 SENSITIVE Sensitive     * >=100,000 COLONIES/mL PROTEUS MIRABILIS  SARS CORONAVIRUS 2 Nasal Swab Aptima Multi Swab     Status: None   Collection Time: 12/27/18  6:29 PM   Specimen: Aptima Multi  Swab; Nasal Swab  Result Value Ref Range Status   SARS Coronavirus 2 NEGATIVE NEGATIVE Final    Comment: (NOTE) SARS-CoV-2 target nucleic acids are NOT DETECTED. The SARS-CoV-2 RNA is generally detectable in upper and lower respiratory specimens during the acute phase of infection. Negative results do not preclude SARS-CoV-2 infection, do not rule out co-infections with other pathogens, and should not be used as the sole basis for treatment or other patient management decisions. Negative results must be combined with clinical observations, patient history, and epidemiological information. The expected result is Negative. Fact Sheet for Patients: HairSlick.nohttps://www.fda.gov/media/138098/download Fact Sheet for Healthcare Providers: quierodirigir.comhttps://www.fda.gov/media/138095/download This test is not yet approved or cleared by the [place] FDA and  has been authorized for detection and/or diagnosis of SARS-CoV-2 by FDA under an Emergency Use Authorization (EUA). This EUA will remain  in effect (meaning this test can be used) for the duration of the COVID-19 declaration under Section 56 4(b)(1) of the Act, 21 U.S.C. section 360bbb-3(b)(1), unless the authorization is terminated or revoked sooner. Performed at [facility], [address] [place], [place] [address]     RADIOLOGY:  No results found.   CODE STATUS:     Code Status Orders  (From admission, onward)         Start     Ordered   12/27/18 2117  Full code  Continuous     12/27/18 2117        Code Status History    Date Active Date Inactive Code Status Order ID Comments User Context   11/01/2018 1855 11/04/2018 2044 Full Code [id number]  Shaune Pollackhen, Qing, MD Inpatient   09/19/2018 1749 09/23/2018 1917 Full Code [id number]  Campbell StallMayo, Katy Dodd, MD ED   01/30/2018 1959 01/31/2018 2237 Full Code [id number]  Ivar DrapeFerri, Amanda, PA-C Inpatient   Advance Care Planning Activity      TOTAL TIME TAKING CARE OF THIS PATIENT: *40* minutes.     Enedina FinnerSona Jonovan Boedecker M.D on 12/30/2018 at 1:54 PM  Between 7am to 6pm - Pager - [phone] After 6pm go to [email or web address] - Social research officer, governmentpassword [email or web address] [email or web address]  [facility]  Office  [phone]  CC: Primary care physician; Danella PentonMiller, Mark F, MD

## 2018-12-30 NOTE — Progress Notes (Addendum)
Pharmacy Electrolyte Monitoring Consult:  Pharmacy consulted to assist in monitoring and replacing electrolytes in this 85 y.o. female admitted on 12/27/2018 with Weakness   Labs:  Sodium (mmol/L)  Date Value  12/30/2018 137   Potassium (mmol/L)  Date Value  12/30/2018 3.6   Magnesium (mg/dL)  Date Value  12/30/2018 2.1   Calcium (mg/dL)  Date Value  12/30/2018 8.6 (L)   Albumin (g/dL)  Date Value  08/28/2018 4.2    Assessment/Plan: Pt received Potassium 66mEq PO x 1 yesterday K 3.6, Mag 2.1 - no supplementation needed at this time Check K with am labs.  -Will replace to maintain electrolytes within normal limits.   Pharmacy will continue to monitor and adjust per consult.   Lauren Roman 12/30/2018 3:01 PM

## 2018-12-30 NOTE — Care Management Important Message (Signed)
Important Message  Patient Details  Name: Lauren Roman MRN: [id number] Date of Birth: 11/29/1928   Medicare Important Message Given:  Yes     Dannette Barbara 12/30/2018, 11:15 AM

## 2019-04-02 ENCOUNTER — Other Ambulatory Visit: Payer: Self-pay | Admitting: Internal Medicine

## 2019-04-02 DIAGNOSIS — E559 Vitamin D deficiency, unspecified: Secondary | ICD-10-CM | POA: Insufficient documentation

## 2019-04-02 DIAGNOSIS — S22000A Wedge compression fracture of unspecified thoracic vertebra, initial encounter for closed fracture: Secondary | ICD-10-CM

## 2019-04-03 ENCOUNTER — Ambulatory Visit
Admission: RE | Admit: 2019-04-03 | Discharge: 2019-04-03 | Disposition: A | Discharge status: Home / Self Care | Payer: Medicare Other | Source: Ambulatory Visit | Attending: Internal Medicine | Admitting: Internal Medicine

## 2019-04-03 ENCOUNTER — Other Ambulatory Visit: Payer: Self-pay

## 2019-04-03 DIAGNOSIS — S22000A Wedge compression fracture of unspecified thoracic vertebra, initial encounter for closed fracture: Secondary | ICD-10-CM | POA: Insufficient documentation

## 2019-04-07 ENCOUNTER — Other Ambulatory Visit: Payer: Self-pay | Admitting: Orthopedic Surgery

## 2019-04-07 ENCOUNTER — Other Ambulatory Visit
Admission: RE | Admit: 2019-04-07 | Discharge: 2019-04-07 | Disposition: A | Discharge status: Home / Self Care | Payer: Medicare Other | Source: Ambulatory Visit | Attending: Orthopedic Surgery | Admitting: Orthopedic Surgery

## 2019-04-07 ENCOUNTER — Other Ambulatory Visit: Payer: Self-pay

## 2019-04-07 DIAGNOSIS — Z01812 Encounter for preprocedural laboratory examination: Secondary | ICD-10-CM | POA: Diagnosis present

## 2019-04-07 DIAGNOSIS — Z20828 Contact with and (suspected) exposure to other viral communicable diseases: Secondary | ICD-10-CM | POA: Insufficient documentation

## 2019-04-07 LAB — SARS CORONAVIRUS 2 (TAT 6-24 HRS): SARS Coronavirus 2: NEGATIVE

## 2019-04-08 ENCOUNTER — Ambulatory Visit: Payer: Medicare Other

## 2019-04-08 ENCOUNTER — Ambulatory Visit: Payer: Medicare Other | Admitting: Anesthesiology

## 2019-04-08 ENCOUNTER — Ambulatory Visit
Admission: RE | Admit: 2019-04-08 | Discharge: 2019-04-08 | Disposition: A | Discharge status: Home / Self Care | Payer: Medicare Other | Attending: Orthopedic Surgery | Admitting: Orthopedic Surgery

## 2019-04-08 ENCOUNTER — Encounter
Admission: RE | Disposition: A | Discharge status: Home / Self Care | Payer: Self-pay | Source: Home / Self Care | Attending: Orthopedic Surgery

## 2019-04-08 ENCOUNTER — Other Ambulatory Visit: Payer: Self-pay

## 2019-04-08 DIAGNOSIS — K219 Gastro-esophageal reflux disease without esophagitis: Secondary | ICD-10-CM | POA: Insufficient documentation

## 2019-04-08 DIAGNOSIS — M8008XA Age-related osteoporosis with current pathological fracture, vertebra(e), initial encounter for fracture: Secondary | ICD-10-CM | POA: Insufficient documentation

## 2019-04-08 DIAGNOSIS — Z885 Allergy status to narcotic agent status: Secondary | ICD-10-CM | POA: Diagnosis not present

## 2019-04-08 DIAGNOSIS — Z79899 Other long term (current) drug therapy: Secondary | ICD-10-CM | POA: Diagnosis not present

## 2019-04-08 DIAGNOSIS — I10 Essential (primary) hypertension: Secondary | ICD-10-CM | POA: Insufficient documentation

## 2019-04-08 DIAGNOSIS — Z419 Encounter for procedure for purposes other than remedying health state, unspecified: Secondary | ICD-10-CM

## 2019-04-08 DIAGNOSIS — Z888 Allergy status to other drugs, medicaments and biological substances status: Secondary | ICD-10-CM | POA: Diagnosis not present

## 2019-04-08 DIAGNOSIS — F0390 Unspecified dementia without behavioral disturbance: Secondary | ICD-10-CM | POA: Insufficient documentation

## 2019-04-08 DIAGNOSIS — M1990 Unspecified osteoarthritis, unspecified site: Secondary | ICD-10-CM | POA: Insufficient documentation

## 2019-04-08 DIAGNOSIS — Z7952 Long term (current) use of systemic steroids: Secondary | ICD-10-CM | POA: Diagnosis not present

## 2019-04-08 DIAGNOSIS — Z91048 Other nonmedicinal substance allergy status: Secondary | ICD-10-CM | POA: Insufficient documentation

## 2019-04-08 HISTORY — PX: KYPHOPLASTY: SHX5884

## 2019-04-08 SURGERY — KYPHOPLASTY
Anesthesia: General

## 2019-04-08 MED ORDER — CHLORHEXIDINE GLUCONATE 4 % EX LIQD: 60.0000 mL | Freq: Once | CUTANEOUS | Status: DC

## 2019-04-08 MED ORDER — KETOROLAC TROMETHAMINE 0.5 % OP SOLN
1.0000 [drp] | Freq: Three times a day (TID) | OPHTHALMIC | Status: DC | PRN
  Administered 2019-04-08: 1 [drp] via OPHTHALMIC
  Filled 2019-04-08: qty 3

## 2019-04-08 MED ORDER — OXYCODONE HCL 5 MG PO TABS
ORAL_TABLET | ORAL | Status: AC
  Filled 2019-04-08: qty 1

## 2019-04-08 MED ORDER — BUPIVACAINE HCL (PF) 0.5 % IJ SOLN
INTRAMUSCULAR | Status: AC
  Filled 2019-04-08: qty 30

## 2019-04-08 MED ORDER — CEFAZOLIN SODIUM-DEXTROSE 2-4 GM/100ML-% IV SOLN
2.0000 g | INTRAVENOUS | Status: AC
  Administered 2019-04-08: 2 g via INTRAVENOUS

## 2019-04-08 MED ORDER — IOHEXOL 180 MG/ML  SOLN
INTRAMUSCULAR | Status: DC | PRN
  Administered 2019-04-08: 40 mL

## 2019-04-08 MED ORDER — PROPOFOL 10 MG/ML IV BOLUS
INTRAVENOUS | Status: AC
  Filled 2019-04-08: qty 20

## 2019-04-08 MED ORDER — PROPOFOL 500 MG/50ML IV EMUL
INTRAVENOUS | Status: DC | PRN
  Administered 2019-04-08: 20 ug/kg/min via INTRAVENOUS

## 2019-04-08 MED ORDER — FAMOTIDINE 20 MG PO TABS
20.0000 mg | ORAL_TABLET | Freq: Once | ORAL | Status: AC
  Administered 2019-04-08: 14:00:00 20 mg via ORAL

## 2019-04-08 MED ORDER — METOCLOPRAMIDE HCL 10 MG PO TABS: 5.0000 mg | ORAL_TABLET | Freq: Three times a day (TID) | ORAL | Status: DC | PRN

## 2019-04-08 MED ORDER — BUPIVACAINE-EPINEPHRINE (PF) 0.5% -1:200000 IJ SOLN
INTRAMUSCULAR | Status: DC | PRN
  Administered 2019-04-08: 20 mL via PERINEURAL

## 2019-04-08 MED ORDER — LABETALOL HCL 5 MG/ML IV SOLN
5.0000 mg | INTRAVENOUS | Status: DC | PRN
  Administered 2019-04-08: 16:00:00 5 mg via INTRAVENOUS

## 2019-04-08 MED ORDER — CEFAZOLIN SODIUM-DEXTROSE 2-4 GM/100ML-% IV SOLN
INTRAVENOUS | Status: AC
  Filled 2019-04-08: qty 100

## 2019-04-08 MED ORDER — ONDANSETRON HCL 4 MG/2ML IJ SOLN: 4.0000 mg | Freq: Four times a day (QID) | INTRAMUSCULAR | Status: DC | PRN

## 2019-04-08 MED ORDER — LACTATED RINGERS IV SOLN
INTRAVENOUS | Status: DC
  Administered 2019-04-08: 14:00:00 via INTRAVENOUS

## 2019-04-08 MED ORDER — METOCLOPRAMIDE HCL 5 MG/ML IJ SOLN: 5.0000 mg | Freq: Three times a day (TID) | INTRAMUSCULAR | Status: DC | PRN

## 2019-04-08 MED ORDER — ONDANSETRON HCL 4 MG/2ML IJ SOLN
INTRAMUSCULAR | Status: DC | PRN
  Administered 2019-04-08: 4 mg via INTRAVENOUS

## 2019-04-08 MED ORDER — EPINEPHRINE PF 1 MG/ML IJ SOLN
INTRAMUSCULAR | Status: AC
  Filled 2019-04-08: qty 1

## 2019-04-08 MED ORDER — PROPOFOL 10 MG/ML IV BOLUS
INTRAVENOUS | Status: DC | PRN
  Administered 2019-04-08 (×2): 10 mg via INTRAVENOUS
  Administered 2019-04-08: 20 mg via INTRAVENOUS

## 2019-04-08 MED ORDER — ONDANSETRON HCL 4 MG PO TABS: 4.0000 mg | ORAL_TABLET | Freq: Four times a day (QID) | ORAL | Status: DC | PRN

## 2019-04-08 MED ORDER — FENTANYL CITRATE (PF) 100 MCG/2ML IJ SOLN: 25.0000 ug | INTRAMUSCULAR | Status: DC | PRN

## 2019-04-08 MED ORDER — FENTANYL CITRATE (PF) 100 MCG/2ML IJ SOLN
INTRAMUSCULAR | Status: DC | PRN
  Administered 2019-04-08 (×3): 25 ug via INTRAVENOUS

## 2019-04-08 MED ORDER — FAMOTIDINE 20 MG PO TABS
ORAL_TABLET | ORAL | Status: AC
  Administered 2019-04-08: 20 mg via ORAL
  Filled 2019-04-08: qty 1

## 2019-04-08 MED ORDER — OXYCODONE HCL 5 MG PO TABS
5.0000 mg | ORAL_TABLET | Freq: Once | ORAL | Status: AC
  Administered 2019-04-08: 5 mg via ORAL

## 2019-04-08 MED ORDER — ONDANSETRON HCL 4 MG/2ML IJ SOLN: 4.0000 mg | Freq: Once | INTRAMUSCULAR | Status: DC | PRN

## 2019-04-08 MED ORDER — SODIUM CHLORIDE 0.9 % IV SOLN: INTRAVENOUS | Status: DC

## 2019-04-08 MED ORDER — OXYCODONE HCL 5 MG PO TABS: 5.0000 mg | ORAL_TABLET | Freq: Three times a day (TID) | ORAL | 0 refills | Status: DC | PRN

## 2019-04-08 MED ORDER — LIDOCAINE HCL 1 % IJ SOLN
INTRAMUSCULAR | Status: DC | PRN
  Administered 2019-04-08: 30 mL

## 2019-04-08 MED ORDER — LIDOCAINE HCL (PF) 1 % IJ SOLN
INTRAMUSCULAR | Status: AC
  Filled 2019-04-08: qty 30

## 2019-04-08 MED ORDER — LABETALOL HCL 5 MG/ML IV SOLN
INTRAVENOUS | Status: AC
  Administered 2019-04-08: 5 mg via INTRAVENOUS
  Filled 2019-04-08: qty 4

## 2019-04-08 MED ORDER — FENTANYL CITRATE (PF) 100 MCG/2ML IJ SOLN
INTRAMUSCULAR | Status: AC
  Filled 2019-04-08: qty 2

## 2019-04-08 SURGICAL SUPPLY — 21 items
CEMENT KYPHON CX01A KIT/MIXER (Cement) ×3 IMPLANT
COVER WAND RF STERILE (DRAPES) ×2 IMPLANT
DERMABOND ADVANCED (GAUZE/BANDAGES/DRESSINGS) ×1
DERMABOND ADVANCED .7 DNX12 (GAUZE/BANDAGES/DRESSINGS) ×1 IMPLANT
DEVICE BIOPSY BONE KYPH (INSTRUMENTS) ×1 IMPLANT
DEVICE BIOPSY BONE KYPHX (INSTRUMENTS) ×2 IMPLANT
DRAPE C-ARM XRAY 36X54 (DRAPES) ×2 IMPLANT
DURAPREP 26ML APPLICATOR (WOUND CARE) ×2 IMPLANT
FEE RENTAL RFA GENERATOR (MISCELLANEOUS) IMPLANT
GLOVE SURG SYN 9.0  PF PI (GLOVE) ×1
GLOVE SURG SYN 9.0 PF PI (GLOVE) ×1 IMPLANT
GOWN SRG 2XL LVL 4 RGLN SLV (GOWNS) ×1 IMPLANT
GOWN STRL NON-REIN 2XL LVL4 (GOWNS) ×1
GOWN STRL REUS W/ TWL LRG LVL3 (GOWN DISPOSABLE) ×1 IMPLANT
GOWN STRL REUS W/TWL LRG LVL3 (GOWN DISPOSABLE) ×1
PACK KYPHOPLASTY (MISCELLANEOUS) ×2 IMPLANT
RENTAL RFA GENERATOR (MISCELLANEOUS) IMPLANT
STRAP SAFETY 5IN WIDE (MISCELLANEOUS) ×2 IMPLANT
TRAY KYPHOPAK 15/2 EXPRESS (KITS) ×1 IMPLANT
TRAY KYPHOPAK 15/3 EXPRESS 1ST (MISCELLANEOUS) ×1 IMPLANT
TRAY KYPHOPAK 20/3 EXPRESS 1ST (MISCELLANEOUS) ×1 IMPLANT

## 2019-04-08 NOTE — Op Note (Signed)
Date 04/08/2019  Time  3:18 pm   PATIENT:  Lauren Roman   PRE-OPERATIVE DIAGNOSIS:  closed wedge compression fracture of T9   POST-OPERATIVE DIAGNOSIS:  closed wedge compression fracture of T9   PROCEDURE:  Procedure(s): KYPHOPLASTY T9  SURGEON: Laurene Footman, MD   ASSISTANTS: None   ANESTHESIA:   local and MAC   EBL:  No intake/output data recorded.   BLOOD ADMINISTERED:none   DRAINS: none    LOCAL MEDICATIONS USED:  MARCAINE    and XYLOCAINE    SPECIMEN:   T9 vertebral body biopsy   DISPOSITION OF SPECIMEN:  Pathology   COUNTS:  YES   TOURNIQUET:  * No tourniquets in log *   IMPLANTS: Bone cement   DICTATION: .Dragon Dictation  patient was brought to the operating room and after adequate anesthesia was obtained the patient was placed prone.  C arm was brought in in good visualization of the affected level obtained on both AP and lateral projections.  After patient identification and timeout procedures were completed, local anesthetic was infiltrated with 10 cc 1% Xylocaine infiltrated subcutaneously.  This is done the area on the each side of the planned approach.  The back was then prepped and draped you sterile manner and repeat timeout procedure carried out.  A spinal needle was brought down to the pedicle on the each side of  T9 and a 50-50 mix of 1% Xylocaine half percent Sensorcaine with epinephrine total of 20 cc injected.  After allowing this to set a small incision was made and the trocar was advanced into the vertebral body in an extrapedicular fashion.  Inflation to 2 cc was carried on the left and then identical introduction of the trocar on the right which was made easier by opening up a vertebral plana with 2 cc of inflation on that side as well. Biopsy was obtained with the initial trocar Drilling was carried out balloon inserted with inflation to2 cc as noted previously first on the left than the right.  When the cement was appropriate consistency to cc were  injected into the vertebral body without extravasation on each side, good fill superior to inferior endplates and from right to left sides along the inferior endplate.  After the cement had set the trochar was removed and permanent C-arm views obtained.  The wound was closed with Dermabond followed by Band-Aid   PLAN OF CARE: Discharge to home after PACU   PATIENT DISPOSITION:  PACU - hemodynamically stable.

## 2019-04-08 NOTE — Anesthesia Preprocedure Evaluation (Signed)
Anesthesia Evaluation  Patient identified by MRN, date of birth, ID band Patient awake    Reviewed: Allergy & Precautions, NPO status , Patient's Chart, lab work & pertinent test results  History of Anesthesia Complications Negative for: history of anesthetic complications  Airway Mallampati: II  TM Distance: >3 FB Neck ROM: Full    Dental no notable dental hx.    Pulmonary neg pulmonary ROS, neg sleep apnea, neg COPD,    breath sounds clear to auscultation- rhonchi (-) wheezing      Cardiovascular Exercise Tolerance: Good (-) hypertension(-) CAD, (-) Past MI, (-) Cardiac Stents and (-) CABG  Rhythm:Regular Rate:Normal - Systolic murmurs and - Diastolic murmurs    Neuro/Psych  Headaches, neg Seizures PSYCHIATRIC DISORDERS Dementia    GI/Hepatic negative GI ROS, Neg liver ROS,   Endo/Other  negative endocrine ROSneg diabetes  Renal/GU negative Renal ROS     Musculoskeletal  (+) Arthritis ,   Abdominal (+) - obese,   Peds  Hematology negative hematology ROS (+)   Anesthesia Other Findings   Reproductive/Obstetrics                            Anesthesia Physical Anesthesia Plan  ASA: II  Anesthesia Plan: General   Post-op Pain Management:    Induction: Intravenous  PONV Risk Score and Plan: 2 and Propofol infusion  Airway Management Planned: Natural Airway  Additional Equipment:   Intra-op Plan:   Post-operative Plan:   Informed Consent: I have reviewed the patients History and Physical, chart, labs and discussed the procedure including the risks, benefits and alternatives for the proposed anesthesia with the patient or authorized representative who has indicated his/her understanding and acceptance.     Dental advisory given  Plan Discussed with: CRNA and Anesthesiologist  Anesthesia Plan Comments:         Anesthesia Quick Evaluation

## 2019-04-08 NOTE — Transfer of Care (Signed)
Immediate Anesthesia Transfer of Care Note  Patient: EMMOGENE SIMSON  Procedure(s) Performed: T9 KYPHOPLASTY (N/A )  Patient Location: PACU  Anesthesia Type:General  Level of Consciousness: drowsy  Airway & Oxygen Therapy: Patient Spontanous Breathing and Patient connected to nasal cannula oxygen  Post-op Assessment: Report given to RN and Post -op Vital signs reviewed and stable  Post vital signs: Reviewed and stable  Last Vitals:  Vitals Value Taken Time  BP 175/68 04/08/19 1511  Temp 36.7 C 04/08/19 1511  Pulse 72 04/08/19 1519  Resp 18 04/08/19 1519  SpO2 98 % 04/08/19 1519  Vitals shown include unvalidated device data.  Last Pain:  Vitals:   04/08/19 1346  TempSrc: Tympanic  PainSc: 9          Complications: No apparent anesthesia complications

## 2019-04-08 NOTE — H&P (Signed)
Reviewed paper H+P, will be scanned into chart. No changes noted.  

## 2019-04-08 NOTE — Discharge Instructions (Addendum)
Take it easy today and tomorrow.  Thursday remove Band-Aids then okay to shower.  Try not to lift much or bend at the waist for the next 2 weeks as you heal.  Pain medicine as directed.  Resume all normal medications.  AMBULATORY SURGERY  DISCHARGE INSTRUCTIONS   1) The drugs that you were given will stay in your system until tomorrow so for the next 24 hours you should not:  A) Drive an automobile B) Make any legal decisions C) Drink any alcoholic beverage   2) You may resume regular meals tomorrow.  Today it is better to start with liquids and gradually work up to solid foods.  You may eat anything you prefer, but it is better to start with liquids, then soup and crackers, and gradually work up to solid foods.   3) Please notify your doctor immediately if you have any unusual bleeding, trouble breathing, redness and pain at the surgery site, drainage, fever, or pain not relieved by medication.    4) Additional Instructions:        Please contact your physician with any problems or Same Day Surgery at [phone], Monday through Friday 6 am to 4 pm, or [place] [facility] number at [phone].

## 2019-04-08 NOTE — Anesthesia Postprocedure Evaluation (Signed)
Anesthesia Post Note  Patient: Lauren Roman  Procedure(s) Performed: T9 KYPHOPLASTY (N/A )  Patient location during evaluation: PACU Anesthesia Type: General Level of consciousness: awake and alert Pain management: pain level controlled Vital Signs Assessment: post-procedure vital signs reviewed and stable Respiratory status: spontaneous breathing, nonlabored ventilation, respiratory function stable and patient connected to nasal cannula oxygen Cardiovascular status: blood pressure returned to baseline and stable Postop Assessment: no apparent nausea or vomiting Anesthetic complications: no     Last Vitals:  Vitals:   04/08/19 1621 04/08/19 1705  BP: (!) 172/73 (!) 156/65  Pulse: 77 75  Resp: 16 16  Temp: 36.8 C   SpO2: 97% 97%    Last Pain:  Vitals:   04/08/19 1705  TempSrc:   PainSc: 0-No pain                 Dylan Ruotolo S

## 2019-04-08 NOTE — Anesthesia Post-op Follow-up Note (Signed)
Anesthesia QCDR form completed.        

## 2019-04-09 ENCOUNTER — Encounter: Payer: Self-pay | Admitting: Orthopedic Surgery

## 2019-04-11 LAB — SURGICAL PATHOLOGY

## 2019-04-24 ENCOUNTER — Other Ambulatory Visit: Payer: Self-pay | Admitting: Orthopedic Surgery

## 2019-04-24 DIAGNOSIS — Z9889 Other specified postprocedural states: Secondary | ICD-10-CM

## 2019-04-24 DIAGNOSIS — S22070A Wedge compression fracture of T9-T10 vertebra, initial encounter for closed fracture: Secondary | ICD-10-CM

## 2019-04-28 ENCOUNTER — Other Ambulatory Visit: Payer: Self-pay

## 2019-04-28 ENCOUNTER — Ambulatory Visit
Admission: RE | Admit: 2019-04-28 | Discharge: 2019-04-28 | Disposition: A | Discharge status: Home / Self Care | Payer: Medicare Other | Source: Ambulatory Visit | Attending: Orthopedic Surgery | Admitting: Orthopedic Surgery

## 2019-04-28 DIAGNOSIS — Z9889 Other specified postprocedural states: Secondary | ICD-10-CM | POA: Diagnosis present

## 2019-04-28 DIAGNOSIS — S22070A Wedge compression fracture of T9-T10 vertebra, initial encounter for closed fracture: Secondary | ICD-10-CM | POA: Insufficient documentation

## 2019-06-06 ENCOUNTER — Telehealth: Payer: Self-pay

## 2019-06-06 NOTE — Telephone Encounter (Signed)
Would someone please add this number to patient contact list, as the patient may not be able to answer history questions. Lauren Roman [phone] daughter

## 2019-06-09 NOTE — Progress Notes (Signed)
Patient: Lauren Roman  Service Category: E/M  Provider: Gillis Santa, MD  DOB: [birth year]  DOS: 06/10/2019  Location: Office  MRN: [id number]  Setting: Ambulatory outpatient  Referring Provider: Meade Maw, MD  Type: New Patient  Specialty: Interventional Pain Management  PCP: Lauren Aus, MD  Location: Home  Delivery: TeleHealth     Virtual Encounter - Pain Management PROVIDER NOTE: Information contained herein reflects review and annotations entered in association with encounter. Interpretation of such information and data should be left to medically-trained personnel. Information provided to patient can be located elsewhere in the medical record under "Patient Instructions". Document created using STT-dictation technology, any transcriptional errors that may result from process are unintentional.    Contact & Pharmacy Preferred: [phone] Home: [phone] (home) Mobile: [phone] (mobile) E-mail: [email or web address]  [place], [place] - [place] [address] Phone: [phone] Fax: [phone]   Pre-screening note:  Our staff contacted Ms. Tan and offered her an "in person", "face-to-face" appointment versus a telephone encounter. She indicated preferring the telephone encounter, at this time.  Primary Reason(s) for Visit: Tele-Encounter for initial evaluation of one or more chronic problems (new to examiner) potentially causing chronic pain, and posing a threat to normal musculoskeletal function. (Level of risk: High) CC: Back Pain (low) and Hip Pain (left)  I contacted Lauren Roman and her daughter on 06/10/2019 via telephone.      I clearly identified myself as Lauren Santa, MD. I verified that I was speaking with the correct person using two identifiers (Name: Lauren Roman, and date of birth: [birth year]).  Advanced Informed Consent I sought verbal advanced consent from Lauren Roman for virtual visit  interactions. I informed Lauren Roman of possible security and privacy concerns, risks, and limitations associated with providing "not-in-person" medical evaluation and management services. I also informed Lauren Roman of the availability of "in-person" appointments. Finally, I informed her that there would be a charge for the virtual visit and that she could be  personally, fully or partially, financially responsible for it. Lauren Roman expressed understanding and agreed to proceed.   HPI  Lauren Roman is a 86 y.o. year old, female patient, contacted today for an initial evaluation of her chronic pain. She has Lumbar radiculopathy; Closed left hip fracture ([place]); HAP (hospital-acquired pneumonia); UTI (urinary tract infection); Pressure injury of skin; Acute deep vein thrombosis (DVT) of femoral vein of left lower extremity ([place]); Adult idiopathic generalized osteoporosis; B12 deficiency; Chronic osteoarthritis; and DDD (degenerative disc disease), lumbar on their problem list.   Onset and Duration: Date of onset: 2019 Cause of pain: Kyphoplasty, decompression Severity: Getting worse Timing: Night Aggravating Factors: Motion and Surgery made it worse Alleviating Factors: Resting Associated Problems: Pain that wakes patient up and Pain that does not allow patient to sleep Quality of Pain: Aching, Constant, Shooting and Throbbing Previous Examinations or Tests: MRI scan, X-rays and Neurosurgical evaluation Previous Treatments: Epidural steroid injections, Narcotic medications and Physical Therapy  Patient is a pleasant 86 year old female with multiple extensive surgeries with a chief complaint of mid thoracic and lumbar spine pain.  Patient has a history of left L4-L5 lateral decompression lumbar radiculopathy which was done in 2019.  She also has a left intramedullary nail and rod for a history of a femur fracture.  Patient also has a history of T9 kyphoplasty for T9 compression fracture.  She endorses chronic  mid back hip and thigh pain that is worse  on the left.  She has been receiving oxycodone from her orthopedist.  She is referred here for chronic pain management.  She utilizes oxycodone 5 mg twice daily as needed which helps her manage her pain and function.Patient is on magnesium daily along with gabapentin 300 mg 3 times daily, vitamin B12, aspirin 81 mg, Xarelto, acetaminophen as needed.  She utilizes oxycodone 5 mg twice daily as needed   Historic Controlled Substance Pharmacotherapy Review  Current opioid analgesics:  06/03/2019  2   06/03/2019  Oxycodone Hcl 5 MG Tablet  20.00  5 Lauren Roman   [id number]   Thr (4878)   0  30.00 MME  Comm Ins   [place]  05/19/2019  2   05/19/2019  Oxycodone Hcl 5 MG Tablet  28.00  7 Lauren Roman   [id number]   Thr (5364)   0  30.00 MME  Comm Ins   [place]    Historical Monitoring: The patient  reports no history of drug use. List of all UDS Test(s): No results found for: MDMA, COCAINSCRNUR, [place], [place], CANNABQUANT, THCU, [place] List of other Serum/Urine Drug Screening Test(s):  No results found for: AMPHSCRSER, BARBSCRSER, BENZOSCRSER, COCAINSCRSER, COCAINSCRNUR, PCPSCRSER, PCPQUANT, THCSCRSER, THCU, CANNABQUANT, OPIATESCRSER, OXYSCRSER, PROPOXSCRSER, ETH Historical Background Evaluation: [place] PMP: PDMP reviewed during this encounter. Two (2) year initial data search conducted.               Pharmacologic Plan: As per protocol, I have not taken over any controlled substance management, pending the results of ordered tests and/or consults.            Initial impression: Pending review of available data and ordered tests.  Meds   Current Outpatient Medications:  .  acetaminophen (TYLENOL) 650 MG CR tablet, Take 650 mg by mouth as needed for pain., Disp: , Rfl:  .  aspirin EC 81 MG tablet, Take 81 mg by mouth daily., Disp: , Rfl:  .  Cholecalciferol (VITAMIN D-1000 MAX ST) 1000 units tablet, Take 1,000 Units by mouth daily., Disp: , Rfl:  .  fluticasone (FLONASE) 50 MCG/ACT  nasal spray, Place 1 spray into both nostrils daily., Disp: , Rfl:  .  magnesium oxide (MAG-OX) 400 MG tablet, Take 400 mg by mouth daily., Disp: , Rfl:  .  nitrofurantoin (MACRODANTIN) 50 MG capsule, Take 50 mg by mouth at bedtime., Disp: , Rfl:  .  omeprazole (PRILOSEC) 40 MG capsule, Take 40 mg by mouth daily., Disp: , Rfl:  .  oxyCODONE (ROXICODONE) 5 MG immediate release tablet, Take 1 tablet (5 mg total) by mouth every 8 (eight) hours as needed., Disp: 20 tablet, Rfl: 0 .  predniSONE (DELTASONE) 5 MG tablet, Take 5 mg by mouth daily., Disp: , Rfl:  .  Probiotic Product (ALIGN) 4 MG CAPS, Take 4 mg by mouth daily., Disp: , Rfl:  .  propranolol (INDERAL) 20 MG tablet, Take 20 mg by mouth daily., Disp: , Rfl:  .  Propylene Glycol (SYSTANE COMPLETE OP), Place 1 drop into both eyes daily., Disp: , Rfl:  .  solifenacin (VESICARE) 10 MG tablet, Take 10 mg by mouth daily., Disp: , Rfl:  .  vitamin B-12 (CYANOCOBALAMIN) 1000 MCG tablet, Take 1,000 mcg by mouth daily., Disp: , Rfl:  .  vitamin k 100 MCG tablet, Take 100 mcg by mouth daily., Disp: , Rfl:  .  gabapentin (NEURONTIN) 300 MG capsule, Take 300 mg by mouth 3 (three) times daily., Disp: , Rfl:  .  hydrochlorothiazide (HYDRODIURIL)  25 MG tablet, Take 25 mg by mouth daily., Disp: , Rfl:  .  XARELTO 15 MG TABS tablet, Take 15 mg by mouth 2 (two) times daily., Disp: , Rfl:   ROS  Cardiovascular: Daily Aspirin intake Pulmonary or Respiratory: No reported pulmonary signs or symptoms such as wheezing and difficulty taking a deep full breath (Asthma), difficulty blowing air out (Emphysema), coughing up mucus (Bronchitis), persistent dry cough, or temporary stoppage of breathing during sleep Neurological: No reported neurological signs or symptoms such as seizures, abnormal skin sensations, urinary and/or fecal incontinence, being born with an abnormal open spine and/or a tethered spinal cord Psychological-Psychiatric: No reported psychological or  psychiatric signs or symptoms such as difficulty sleeping, anxiety, depression, delusions or hallucinations (schizophrenial), mood swings (bipolar disorders) or suicidal ideations or attempts Gastrointestinal: Reflux or heatburn Genitourinary: No reported renal or genitourinary signs or symptoms such as difficulty voiding or producing urine, peeing blood, non-functioning kidney, kidney stones, difficulty emptying the bladder, difficulty controlling the flow of urine, or chronic kidney disease Hematological: Positive for taking the following blood thinner: ASA 81 mg/day Endocrine: No reported endocrine signs or symptoms such as high or low blood sugar, rapid heart rate due to high thyroid levels, obesity or weight gain due to slow thyroid or thyroid disease Rheumatologic: Joint aches and or swelling due to excess weight (Osteoarthritis) Musculoskeletal: Negative for myasthenia gravis, muscular dystrophy, multiple sclerosis or malignant hyperthermia Work History: Retired  Allergies  Ms. Fana is allergic to exelon [rivastigmine tartrate]; hydrocodone-acetaminophen; and other.  Laboratory Chemistry Profile   Screening Lab Results  Component Value Date   SARSCOV2NAA NEGATIVE 04/07/2019   STAPHAUREUS NEGATIVE 09/19/2018   MRSAPCR NEGATIVE 11/01/2018    Inflammation (CRP: Acute Phase) (ESR: Chronic Phase) Lab Results  Component Value Date   ESRSEDRATE 9 06/09/2013                         Renal Lab Results  Component Value Date   BUN 16 12/30/2018   CREATININE 0.51 12/30/2018   GFRAA >60 12/30/2018   GFRNONAA >60 12/30/2018                             Hepatic Lab Results  Component Value Date   AST 32 08/28/2018   ALT 12 08/28/2018   ALBUMIN 4.2 08/28/2018   ALKPHOS 54 08/28/2018   LIPASE 19 08/28/2018                        Electrolytes Lab Results  Component Value Date   NA 137 12/30/2018   K 3.6 12/30/2018   CL 101 12/30/2018   CALCIUM 8.6 (L) 12/30/2018   MG 2.1  12/30/2018                        Neuropathy No results found for: VITAMINB12, FOLATE, HGBA1C, HIV                      CNS No results found for: COLORCSF, APPEARCSF, RBCCOUNTCSF, WBCCSF, POLYSCSF, LYMPHSCSF, EOSCSF, PROTEINCSF, GLUCCSF, JCVIRUS, CSFOLI, IGGCSF, LABACHR, ACETBL                      Bone No results found for: [place], [id number], GM0102VO5, [id number], 25OHVITD1, 25OHVITD2, 25OHVITD3, TESTOFREE, TESTOSTERONE  Coagulation Lab Results  Component Value Date   INR 1.0 09/19/2018   LABPROT 12.7 09/19/2018   APTT 31 01/29/2018   PLT 129 (L) 12/29/2018                        Cardiovascular Lab Results  Component Value Date   TROPONINI <0.03 08/28/2018   HGB 11.5 (L) 12/29/2018   HCT 35.0 (L) 12/29/2018                         ID Lab Results  Component Value Date   SARSCOV2NAA NEGATIVE 04/07/2019   STAPHAUREUS NEGATIVE 09/19/2018   MRSAPCR NEGATIVE 11/01/2018    Cancer No results found for: CEA, CA125, LABCA2                      Endocrine Lab Results  Component Value Date   TSH 0.591 12/27/2018                        Note: Lab results reviewed.  Imaging Review   Thoracic MR wo contrast:  Results for orders placed during the hospital encounter of 04/28/19  MR THORACIC SPINE WO CONTRAST   Narrative CLINICAL DATA:  Patient status post T9 kyphoplasty 04/08/2019.  EXAM: MRI THORACIC SPINE WITHOUT CONTRAST  TECHNIQUE: Multiplanar, multisequence MR imaging of the thoracic spine was performed. No intravenous contrast was administered.  COMPARISON:  MRI thoracic spine 04/03/2019.  FINDINGS: Alignment:  Maintained.  Vertebrae: The patient has undergone T9 kyphoplasty since the prior examination. There has been no progression vertebral body height loss at T9. No new fracture or worrisome lesion is identified. Schmorl's node versus remote, mild inferior endplate compression fracture of T3 is unchanged.  Cord:  Normal signal  throughout.  Paraspinal and other soft tissues: Negative.  Disc levels:  The central canal and foramina are widely patent at all levels. Degenerative disc disease is most notable at T7-8 where a small central protrusion indents the ventral thecal sac but there is no stenosis. Minimal retropulsion off the inferior margin of T9 is unchanged.  IMPRESSION: Status post T9 kyphoplasty without evidence of complication. No new abnormality since the prior MRI. The central canal and foramina are open at all levels.   Electronically Signed   By: Inge Rise M.D.   On: 04/28/2019 11:18     Results for orders placed during the hospital encounter of 04/08/19  DG Thoracic Spine 2 View   Narrative CLINICAL DATA:  Kyphoplasty.  EXAM: THORACIC SPINE 2 VIEWS  COMPARISON:  April 03, 2019.  FINDINGS: Frontal and lateral views demonstrate expected postsurgical changes related to T9 kyphoplasty. Again noted is a compression deformity of the T9 vertebral body.  IMPRESSION: Satisfactory postoperative appearance status post T9 kyphoplasty.   Electronically Signed   By: Constance Holster M.D.   On: 04/08/2019 15:23    Lumbosacral Imaging: Lumbar MR wo contrast:  Results for orders placed during the hospital encounter of 07/03/18  MR LUMBAR SPINE WO CONTRAST   Narrative CLINICAL DATA:  Lumbar radiculopathy. Back pain with left buttock pain. History of back surgery 01/30/2018  EXAM: MRI LUMBAR SPINE WITHOUT CONTRAST  TECHNIQUE: Multiplanar, multisequence MR imaging of the lumbar spine was performed. No intravenous contrast was administered.  COMPARISON:  Lumbar MRI 01/11/2018  FINDINGS: Segmentation:  Normal  Alignment: Mild retrolisthesis T12-L1 and L1-2 unchanged. 5 mm anterolisthesis L4-5 unchanged.  Vertebrae: Negative  for lumbar fracture or mass. Normal bone marrow.  Conus medullaris and cauda equina: Conus extends to the T12-L1 level. Conus and cauda equina  appear normal.  Paraspinal and other soft tissues: Right renal cyst. No paraspinous soft tissue mass.  Disc levels:  L1-2: Disc degeneration with disc bulging and mild spurring. Negative for stenosis.  L2-3: Disc degeneration. Disc bulging and mild spurring. Negative for stenosis.  L3-4: Mild right foraminal narrowing due to spurring. Bilateral facet degeneration. No change from the prior study.  L4-5: Large foraminal and extraforaminal disc protrusion on the left has progressed in the interval. There is marked left foraminal encroachment with impingement of the left L4 nerve root with progression. Advanced facet degeneration. Moderate spinal stenosis.  L5-S1: Mild facet degeneration. Small left foraminal disc protrusion unchanged which could affect the left L5 nerve root.  IMPRESSION: Large foraminal and extraforaminal disc protrusion L4-5 has progressed. Progressive left L4 nerve root impingement. Moderate spinal stenosis at L4-5  Small left foraminal disc protrusion L5-S1 is unchanged.   Electronically Signed   By: Franchot Gallo M.D.   On: 07/03/2018 15:29    visit. Lumbar DG 2-3V (Clearing): No results found for this or any previous visit. Lumbar DG 2-3 views:  Results for orders placed during the hospital encounter of 01/30/18  DG Lumbar Spine 2-3 Views   Narrative CLINICAL DATA:  Spine surgery  EXAM: DG C-ARM 61-120 MIN; LUMBAR SPINE - 2-3 VIEW  COMPARISON:  MRI 01/11/2018  FINDINGS: Four low resolution intraoperative spot views of the lumbar spine. Total fluoroscopy time was 5.4 seconds. Initial image demonstrates posterior localizing device at what appears to be L4-L5. Subsequent images demonstrate surgical instruments and linear localization device posterior to the L4-L5 disc space.  IMPRESSION: Intraoperative fluoroscopic assistance provided during lumbar spine surgery   Electronically Signed   By: Donavan Foil M.D.   On: 01/30/2018 18:31      results found for this or any previous visit. Hip-L MR wo contrast:  Results for orders placed during the hospital encounter of 01/11/18  MR HIP LEFT WO CONTRAST   Narrative CLINICAL DATA:  Left hip and lower extremity pain for 1 year. No known injury.  EXAM: MR OF THE LEFT HIP WITHOUT CONTRAST  TECHNIQUE: Multiplanar, multisequence MR imaging was performed. No intravenous contrast was administered.  COMPARISON:  CT left upper leg 12/31/2017.  FINDINGS: Bones: With the exception of some degenerative endplate signal change eccentric to the left at L4-5, marrow signal is normal throughout without fracture, stress change or focal lesion. No avascular necrosis of the femoral heads. No subchondral cyst formation or edema about the hips.  Articular cartilage and labrum  Articular cartilage:  Minimally degenerated.  Labrum: Small degenerative tear of the anterior, superior labrum is noted.  Joint or bursal effusion  Joint effusion:  None.  Bursae: Normal.  Muscles and tendons  Muscles and tendons:  Intact and normal in appearance.  Other findings  Miscellaneous:   Imaged intrapelvic contents are unremarkable.  IMPRESSION: Small degenerative tear anterior, superior left acetabular labrum. The exam is otherwise negative.   Electronically Signed   By: Inge Rise M.D.   On: 01/11/2018 13:06      Hip-L DG 2-3 views:  Results for orders placed during the hospital encounter of 11/01/18  DG Hip Unilat W or Wo Pelvis 2-3 Views Left   Narrative CLINICAL DATA:  Left hip pain.  Recent left hip fracture.  EXAM: DG HIP (WITH OR WITHOUT PELVIS) 2-3V LEFT  COMPARISON:  09/19/2018  FINDINGS: Left intertrochanteric fracture left femur has been fixed with compression screw and intramedullary rod. Fracture alignment satisfactory. Hardware in good position.  IMPRESSION: Satisfactory fixation left intertrochanteric fracture. No new fracture.   Electronically  Signed   By: Franchot Gallo M.D.   On: 11/01/2018 14:56     Complexity Note: Imaging results reviewed. Results shared with Ms. Jump, using Layman's terms.                         [place]  Drug: Ms. Greif  reports no history of drug use. Alcohol:  reports no history of alcohol use. Tobacco:  reports that she quit smoking about 56 years ago. She has never used smokeless tobacco. Medical:  has a past medical history of Arthritis, Dementia ([place]), Headache, Hepatitis (1952), Hyperlipidemia, Myasthenia gravis ([place]), and Tremor. Family: family history is not on file.  Past Surgical History:  Procedure Laterality Date  . COLONOSCOPY    . INTRAMEDULLARY (IM) NAIL INTERTROCHANTERIC Left 09/20/2018   Procedure: INTRAMEDULLARY (IM) NAIL INTERTROCHANTRIC;  Surgeon: Hessie Knows, MD;  Location: [facility] ORS;  Service: Orthopedics;  Laterality: Left;  . KYPHOPLASTY N/A 04/08/2019   Procedure: T9 KYPHOPLASTY;  Surgeon: Hessie Knows, MD;  Location: [facility] ORS;  Service: Orthopedics;  Laterality: N/A;  . LUMBAR LAMINECTOMY/DECOMPRESSION MICRODISCECTOMY N/A 01/30/2018   Procedure: LUMBAR LAMINECTOMY/DECOMPRESSION MICRODISCECTOMY 1 LEVEL-L4-5 FAR LATERAL DISCECTOMY;  Surgeon: Lauren Maw, MD;  Location: [facility] ORS;  Service: Neurosurgery;  Laterality: N/A;   Active Ambulatory Problems    Diagnosis Date Noted  . Lumbar radiculopathy 01/30/2018  . Closed left hip fracture ([place]) 09/19/2018  . HAP (hospital-acquired pneumonia) 11/01/2018  . UTI (urinary tract infection) 12/27/2018  . Pressure injury of skin 12/28/2018  . Acute deep vein thrombosis (DVT) of femoral vein of left lower extremity ([place]) 12/10/2018  . Adult idiopathic generalized osteoporosis 06/15/2016  . B12 deficiency 07/21/2014  . Chronic osteoarthritis 06/15/2016  . DDD (degenerative disc disease), lumbar 06/20/2018   Resolved Ambulatory Problems    Diagnosis Date Noted  . No Resolved Ambulatory Problems   Past Medical History:   Diagnosis Date  . Arthritis   . Dementia ([place])   . Headache   . Hepatitis 1952  . Hyperlipidemia   . Myasthenia gravis ([place])   . Tremor    Assessment  Primary Diagnosis & Pertinent Problem List: The primary encounter diagnosis was Lumbar radiculopathy. Diagnoses of Closed fracture of left hip, sequela, B12 deficiency, Chronic osteoarthritis, H/O kyphoplasty, History of compression fracture of spine, History of lumbar laminectomy for spinal cord decompression (left L4/5), Chronic radicular lumbar pain, Spinal stenosis, lumbar region, with neurogenic claudication, and Chronic pain syndrome were also pertinent to this visit.  Visit Diagnosis (New problems to examiner): 1. Lumbar radiculopathy   2. Closed fracture of left hip, sequela   3. B12 deficiency   4. Chronic osteoarthritis   5. H/O kyphoplasty   6. History of compression fracture of spine   7. History of lumbar laminectomy for spinal cord decompression (left L4/5)   8. Chronic radicular lumbar pain   9. Spinal stenosis, lumbar region, with neurogenic claudication   10. Chronic pain syndrome    Plan of Care (Initial workup plan)  Note: Ms. Bungert was reminded that as per protocol, today's visit has been an evaluation only. We have not taken over the patient's controlled substance management.  General Recommendations: The pain condition that the patient suffers from is best treated with a  multidisciplinary approach that involves an increase in physical activity to prevent de-conditioning and worsening of the pain cycle, as well as psychological counseling (formal and/or informal) to address the co-morbid psychological affects of pain. Treatment will often involve judicious use of pain medications and interventional procedures to decrease the pain, allowing the patient to participate in the physical activity that will ultimately produce long-lasting pain reductions. The goal of the multidisciplinary approach is to return the patient to  a higher level of overall function and to restore their ability to perform activities of daily living.  Patient is a pleasant 86 year old female with multiple extensive surgeries with a chief complaint of mid thoracic and lumbar spine pain.  Patient has a history of left L4-L5 lateral decompression lumbar radiculopathy which was done in 2019.  She also has a left intramedullary nail and rod for a history of a femur fracture.  Patient also has a history of T9 kyphoplasty for T9 compression fracture.  She endorses chronic mid back hip and thigh pain that is worse on the left.  She has been receiving oxycodone from her orthopedist.  She is referred here for chronic pain management.  She utilizes oxycodone 5 mg twice daily as needed which helps her manage her pain and function.Patient is on magnesium daily along with gabapentin 300 mg 3 times daily, vitamin B12, aspirin 81 mg, Xarelto, acetaminophen as needed.  She utilizes oxycodone 5 mg twice daily as needed   1. UDS, follow up in 2 weeks   Pharmacological management options:  Opioid Analgesics: The patient was informed that there is no guarantee that she would be a candidate for opioid analgesics. The decision will be made following CDC guidelines. This decision will be based on the results of diagnostic studies, as well as Ms. Blancett's risk profile. Oxycodone 5 mg BID prn #60/month  Membrane stabilizer: Adequate regimen  Muscle relaxant: Not indicated  NSAID: Medically contraindicated on ASA 81 AND XARELTO   Other analgesic(s): To be determined at a later time   Interventional management options: Ms. Flax was informed that there is no guarantee that she would be a candidate for interventional therapies. The decision will be based on the results of diagnostic studies, as well as Ms. Settle's risk profile.  Procedure(s) under consideration:  Failed CT guided ESI Failed hip injection   Provider-requested follow-up: Return in about 2 weeks (around  06/24/2019) for Medication Management, virtual.  No future appointments. Total duration of encounter: 45 minutes.  Primary Care Physician: Lauren Aus, MD Note by: Lauren Santa, MD Date: 06/10/2019; Time: 1:46 PM

## 2019-06-09 NOTE — Telephone Encounter (Signed)
Done

## 2019-06-10 ENCOUNTER — Other Ambulatory Visit: Payer: Self-pay

## 2019-06-10 ENCOUNTER — Encounter: Payer: Self-pay | Admitting: Student in an Organized Health Care Education/Training Program

## 2019-06-10 ENCOUNTER — Ambulatory Visit
Payer: Medicare Other | Attending: Student in an Organized Health Care Education/Training Program | Admitting: Student in an Organized Health Care Education/Training Program

## 2019-06-10 DIAGNOSIS — S72002S Fracture of unspecified part of neck of left femur, sequela: Secondary | ICD-10-CM

## 2019-06-10 DIAGNOSIS — G8929 Other chronic pain: Secondary | ICD-10-CM

## 2019-06-10 DIAGNOSIS — X58XXXS Exposure to other specified factors, sequela: Secondary | ICD-10-CM

## 2019-06-10 DIAGNOSIS — E538 Deficiency of other specified B group vitamins: Secondary | ICD-10-CM | POA: Diagnosis not present

## 2019-06-10 DIAGNOSIS — Z8781 Personal history of (healed) traumatic fracture: Secondary | ICD-10-CM

## 2019-06-10 DIAGNOSIS — M5416 Radiculopathy, lumbar region: Secondary | ICD-10-CM | POA: Diagnosis not present

## 2019-06-10 DIAGNOSIS — M48062 Spinal stenosis, lumbar region with neurogenic claudication: Secondary | ICD-10-CM

## 2019-06-10 DIAGNOSIS — Z9889 Other specified postprocedural states: Secondary | ICD-10-CM

## 2019-06-10 DIAGNOSIS — G894 Chronic pain syndrome: Secondary | ICD-10-CM

## 2019-06-10 DIAGNOSIS — M1990 Unspecified osteoarthritis, unspecified site: Secondary | ICD-10-CM

## 2019-06-13 LAB — COMPLIANCE DRUG ANALYSIS, UR

## 2019-06-23 ENCOUNTER — Telehealth: Payer: Self-pay

## 2019-06-24 ENCOUNTER — Ambulatory Visit
Payer: Medicare Other | Attending: Student in an Organized Health Care Education/Training Program | Admitting: Student in an Organized Health Care Education/Training Program

## 2019-06-24 ENCOUNTER — Encounter: Payer: Self-pay | Admitting: Student in an Organized Health Care Education/Training Program

## 2019-06-24 ENCOUNTER — Other Ambulatory Visit: Payer: Self-pay

## 2019-06-24 DIAGNOSIS — M1990 Unspecified osteoarthritis, unspecified site: Secondary | ICD-10-CM | POA: Diagnosis not present

## 2019-06-24 DIAGNOSIS — M5416 Radiculopathy, lumbar region: Secondary | ICD-10-CM | POA: Diagnosis not present

## 2019-06-24 DIAGNOSIS — Z9889 Other specified postprocedural states: Secondary | ICD-10-CM

## 2019-06-24 DIAGNOSIS — S72002S Fracture of unspecified part of neck of left femur, sequela: Secondary | ICD-10-CM

## 2019-06-24 DIAGNOSIS — Z8781 Personal history of (healed) traumatic fracture: Secondary | ICD-10-CM

## 2019-06-24 DIAGNOSIS — G894 Chronic pain syndrome: Secondary | ICD-10-CM

## 2019-06-24 DIAGNOSIS — E538 Deficiency of other specified B group vitamins: Secondary | ICD-10-CM

## 2019-06-24 MED ORDER — OXYCODONE HCL 5 MG PO TABS: 5.0000 mg | ORAL_TABLET | Freq: Two times a day (BID) | ORAL | 0 refills | Status: AC | PRN

## 2019-06-24 MED ORDER — OXYCODONE HCL 5 MG PO TABS: 5.0000 mg | ORAL_TABLET | Freq: Two times a day (BID) | ORAL | 0 refills | Status: DC | PRN

## 2019-06-24 NOTE — Progress Notes (Signed)
Patient: Lauren Roman  Service Category: E/M  Provider: Gillis Santa, MD  DOB: 08-Apr-1929  DOS: 06/24/2019  Location: Office  MRN: [id number]  Setting: Ambulatory outpatient  Referring Provider: Rusty Aus, MD  Type: Established Patient  Specialty: Interventional Pain Management  PCP: Rusty Aus, MD  Location: Remote location  Delivery: TeleHealth     Virtual Encounter - Pain Management PROVIDER NOTE: Information contained herein reflects review and annotations entered in association with encounter. Interpretation of such information and data should be left to medically-trained personnel. Information provided to patient can be located elsewhere in the medical record under "Patient Instructions". Document created using STT-dictation technology, any transcriptional errors that may result from process are unintentional.    Contact & Pharmacy Preferred: [place]: [phone] (home) Mobile: [phone] (mobile) E-mail: [email or web address]  [place], [address] - [place] [address] [place] Phone: [phone] Fax: [phone]   Pre-screening  Ms. Wesely offered "in-person" vs "virtual" encounter. She indicated preferring virtual for this encounter.   Reason COVID-19*  Social distancing based on CDC and AMA recommendations.   I contacted [phone] on 06/24/2019 via telephone.      I clearly identified myself as 15/01/2020, MD. I verified that I was speaking with the correct person using two identifiers (Name: NAKAYA MISHKIN, and date of birth: 03-Jun-1928).  This visit was completed via telephone due to the restrictions of the COVID-19 pandemic. All issues as above were discussed and addressed but no physical exam was performed. If it was felt that the patient should be evaluated in the office, they were directed there. The patient verbally consented to this visit. Patient was unable to complete an audio/visual visit due to  Technical difficulties and/or Lack of internet. Due to the catastrophic nature of the COVID-19 pandemic, this visit was done through audio contact only.  Location of the patient: home address (see Epic for details)  Location of the provider: office  Consent I sought verbal advanced consent from 04/11/1929 for virtual visit interactions. I informed Ms. Rando of possible security and privacy concerns, risks, and limitations associated with providing "not-in-person" medical evaluation and management services. I also informed Ms. Erekson of the availability of "in-person" appointments. Finally, I informed her that there would be a charge for the virtual visit and that she could be  personally, fully or partially, financially responsible for it. Ms. Gabor expressed understanding and agreed to proceed.    Historic Elements   Ms. TREZURE CRONK is a 86 y.o. year old, female patient evaluated today after her last contact with our practice on 06/23/2019. Ms. Torgeson  has a past medical history of Arthritis, Dementia ([place]), Headache, Hepatitis (1952), Hyperlipidemia, Myasthenia gravis ([place]), and Tremor. She also  has a past surgical history that includes Colonoscopy; Lumbar laminectomy/decompression microdiscectomy (N/A, 01/30/2018); Intramedullary (im) nail intertrochanteric (Left, 09/20/2018); and Kyphoplasty (N/A, 04/08/2019). Ms. Steger has a current medication list which includes the following prescription(s): acetaminophen, alendronate, aspirin ec, cholecalciferol, fluticasone, gabapentin, magnesium oxide, omeprazole, oxycodone, [START ON 07/24/2019] oxycodone, prednisone, align, propranolol, propylene glycol, solifenacin, vitamin b-12, vitamin k, hydrochlorothiazide, nitrofurantoin, and xarelto. She  reports that she quit smoking about 56 years ago. She has never used smokeless tobacco. She reports that she does not drink alcohol or use drugs. Ms. Coye is allergic to exelon [rivastigmine tartrate];  hydrocodone-acetaminophen; and other.   HPI  Today, she is being contacted for medication management.   Patient  has completed her urine toxicology screen which is appropriate.  We will take over her chronic pain regimen of oxycodone 5 mg twice daily as needed.  Pharmacotherapy Assessment  Analgesic: Rx for Oxycodone 5 mg BID prn today  Monitoring: [place] PMP: PDMP reviewed during this encounter.       Plan: Refer to "POC".  UDS:  Summary  Date Value Ref Range Status  06/10/2019 Note  Final    Comment:    ==================================================================== Compliance Drug Analysis, Ur ==================================================================== Test                             Result       Flag       Units Drug Present and Declared for Prescription Verification   Oxycodone                      734          EXPECTED   ng/mg creat   Oxymorphone                    1659         EXPECTED   ng/mg creat   Noroxycodone                   2153         EXPECTED   ng/mg creat   Noroxymorphone                 681          EXPECTED   ng/mg creat    Sources of oxycodone are scheduled prescription medications.    Oxymorphone, noroxycodone, and noroxymorphone are expected    metabolites of oxycodone. Oxymorphone is also available as a    scheduled prescription medication.   Acetaminophen                  PRESENT      EXPECTED Drug Absent but Declared for Prescription Verification   Gabapentin                     Not Detected UNEXPECTED   Salicylate                     Not Detected UNEXPECTED    Aspirin, as indicated in the declared medication list, is not always    detected even when used as directed.   Propranolol                    Not Detected UNEXPECTED ==================================================================== Test                      Result    Flag   Units      Ref Range   Creatinine              32               mg/dL       >=20 ==================================================================== Declared Medications:  The flagging and interpretation on this report are based on the  following declared medications.  Unexpected results may arise from  inaccuracies in the declared medications.  **Note: The testing scope of this panel includes these medications:  Gabapentin  Oxycodone  Propranolol  **Note: The testing scope of this panel does not include small to  moderate amounts of these reported medications:  Acetaminophen  Aspirin  **Note: The testing scope of this panel does not include the  following reported medications:  Cholecalciferol  Cyanocobalamin  Eye Drop  Fluticasone  Hydrochlorothiazide  Magnesium (Mag-Ox)  Nitrofurantoin (Macrobid)  Omeprazole  Prednisone  Probiotic  Rivaroxaban  Solifenacin (Vesicare)  Supplement ==================================================================== For clinical consultation, please call [phone]. ====================================================================    Laboratory Chemistry Profile   Renal Lab Results  Component Value Date   BUN 16 12/30/2018   CREATININE 0.51 12/30/2018   GFRAA >60 12/30/2018   GFRNONAA >60 12/30/2018    Hepatic Lab Results  Component Value Date   AST 32 08/28/2018   ALT 12 08/28/2018   ALBUMIN 4.2 08/28/2018   ALKPHOS 54 08/28/2018   LIPASE 19 08/28/2018    Electrolytes Lab Results  Component Value Date   NA 137 12/30/2018   K 3.6 12/30/2018   CL 101 12/30/2018   CALCIUM 8.6 (L) 12/30/2018   MG 2.1 12/30/2018    Bone No results found for: VD25OH, EH209OB0JGG, EZ6629UT6, LY6503TW6, 25OHVITD1, 25OHVITD2, 25OHVITD3, TESTOFREE, TESTOSTERONE  Coagulation Lab Results  Component Value Date   INR 1.0 09/19/2018   LABPROT 12.7 09/19/2018   APTT 31 01/29/2018   PLT 129 (L) 12/29/2018    Cardiovascular Lab Results  Component Value Date   TROPONINI <0.03 08/28/2018   HGB 11.5 (L) 12/29/2018    HCT 35.0 (L) 12/29/2018    Inflammation (CRP: Acute Phase) (ESR: Chronic Phase) Lab Results  Component Value Date   ESRSEDRATE 9 06/09/2013      Note: Above Lab results reviewed.  Imaging  MR THORACIC SPINE WO CONTRAST CLINICAL DATA:  Patient status post T9 kyphoplasty 04/08/2019.  EXAM: MRI THORACIC SPINE WITHOUT CONTRAST  TECHNIQUE: Multiplanar, multisequence MR imaging of the thoracic spine was performed. No intravenous contrast was administered.  COMPARISON:  MRI thoracic spine 04/03/2019.  FINDINGS: Alignment:  Maintained.  Vertebrae: The patient has undergone T9 kyphoplasty since the prior examination. There has been no progression vertebral body height loss at T9. No new fracture or worrisome lesion is identified. Schmorl's node versus remote, mild inferior endplate compression fracture of T3 is unchanged.  Cord:  Normal signal throughout.  Paraspinal and other soft tissues: Negative.  Disc levels:  The central canal and foramina are widely patent at all levels. Degenerative disc disease is most notable at T7-8 where a small central protrusion indents the ventral thecal sac but there is no stenosis. Minimal retropulsion off the inferior margin of T9 is unchanged.  IMPRESSION: Status post T9 kyphoplasty without evidence of complication. No new abnormality since the prior MRI. The central canal and foramina are open at all levels.  Electronically Signed   By: Inge Rise M.D.   On: 04/28/2019 11:18  Assessment  The primary encounter diagnosis was Lumbar radiculopathy. Diagnoses of Closed fracture of left hip, sequela, B12 deficiency, Chronic osteoarthritis, H/O kyphoplasty, History of compression fracture of spine, History of lumbar laminectomy for spinal cord decompression (left L4/5), and Chronic pain syndrome were also pertinent to this visit.  Plan of Care  I have changed Yves Dill. Keech's oxyCODONE. I am also having her start on oxyCODONE.  Additionally, I am having her maintain her gabapentin, vitamin B-12, Cholecalciferol, propranolol, omeprazole, predniSONE, hydrochlorothiazide, Xarelto, Propylene Glycol (SYSTANE COMPLETE OP), fluticasone, vitamin k, Align, magnesium oxide, aspirin EC, nitrofurantoin, solifenacin, acetaminophen, and alendronate.  Pharmacotherapy (Medications Ordered): Meds ordered this encounter  Medications  . oxyCODONE (ROXICODONE) 5 MG immediate release tablet    Sig: Take 1 tablet (5 mg  total) by mouth 2 (two) times daily as needed for severe pain.    Dispense:  60 tablet    Refill:  0    For chronic pain syndrome  . oxyCODONE (ROXICODONE) 5 MG immediate release tablet    Sig: Take 1 tablet (5 mg total) by mouth 2 (two) times daily as needed for severe pain.    Dispense:  60 tablet    Refill:  0    For chronic pain syndrome   Follow-up plan:   Return in about 9 weeks (around 08/26/2019) for Medication Management, in person.    Recent Visits Date Type Provider Dept  06/10/19 Office Visit Gillis Santa, MD [facility]  Showing recent visits within past 90 days and meeting all other requirements   Today's Visits Date Type Provider Dept  06/24/19 Office Visit Gillis Santa, MD [facility]  Showing today's visits and meeting all other requirements   Future Appointments No visits were found meeting these conditions.  Showing future appointments within next 90 days and meeting all other requirements   I discussed the assessment and treatment plan with the patient. The patient was provided an opportunity to ask questions and all were answered. The patient agreed with the plan and demonstrated an understanding of the instructions.  Patient advised to call back or seek an in-person evaluation if the symptoms or condition worsens.  Duration of encounter: 25 minutes.  Note by: Gillis Santa, MD Date: 06/24/2019; Time: 1:45 PM

## 2019-07-22 ENCOUNTER — Other Ambulatory Visit: Payer: Self-pay | Admitting: Gastroenterology

## 2019-07-22 DIAGNOSIS — K219 Gastro-esophageal reflux disease without esophagitis: Secondary | ICD-10-CM

## 2019-07-22 DIAGNOSIS — R1319 Other dysphagia: Secondary | ICD-10-CM

## 2019-07-22 DIAGNOSIS — R1310 Dysphagia, unspecified: Secondary | ICD-10-CM

## 2019-07-28 ENCOUNTER — Other Ambulatory Visit: Payer: Self-pay

## 2019-07-28 ENCOUNTER — Ambulatory Visit
Admission: RE | Admit: 2019-07-28 | Discharge: 2019-07-28 | Disposition: A | Discharge status: Home / Self Care | Payer: Medicare Other | Source: Ambulatory Visit | Attending: Gastroenterology | Admitting: Gastroenterology

## 2019-07-28 DIAGNOSIS — R1319 Other dysphagia: Secondary | ICD-10-CM

## 2019-07-28 DIAGNOSIS — R1310 Dysphagia, unspecified: Secondary | ICD-10-CM | POA: Diagnosis present

## 2019-07-28 DIAGNOSIS — K219 Gastro-esophageal reflux disease without esophagitis: Secondary | ICD-10-CM

## 2019-08-04 DIAGNOSIS — Z8781 Personal history of (healed) traumatic fracture: Secondary | ICD-10-CM | POA: Insufficient documentation

## 2019-08-14 ENCOUNTER — Emergency Department: Payer: Medicare Other

## 2019-08-14 ENCOUNTER — Inpatient Hospital Stay
Admission: EM | Admit: 2019-08-14 | Discharge: 2019-08-19 | DRG: 542 | Disposition: A | Discharge status: Home Health | Payer: Medicare Other | Attending: Family Medicine | Admitting: Family Medicine

## 2019-08-14 ENCOUNTER — Other Ambulatory Visit: Payer: Self-pay

## 2019-08-14 ENCOUNTER — Inpatient Hospital Stay: Payer: Medicare Other

## 2019-08-14 ENCOUNTER — Encounter: Payer: Self-pay | Admitting: Emergency Medicine

## 2019-08-14 DIAGNOSIS — Z9109 Other allergy status, other than to drugs and biological substances: Secondary | ICD-10-CM

## 2019-08-14 DIAGNOSIS — S4291XA Fracture of right shoulder girdle, part unspecified, initial encounter for closed fracture: Secondary | ICD-10-CM

## 2019-08-14 DIAGNOSIS — M1990 Unspecified osteoarthritis, unspecified site: Secondary | ICD-10-CM | POA: Diagnosis present

## 2019-08-14 DIAGNOSIS — E785 Hyperlipidemia, unspecified: Secondary | ICD-10-CM | POA: Diagnosis present

## 2019-08-14 DIAGNOSIS — M549 Dorsalgia, unspecified: Secondary | ICD-10-CM | POA: Diagnosis present

## 2019-08-14 DIAGNOSIS — R410 Disorientation, unspecified: Secondary | ICD-10-CM

## 2019-08-14 DIAGNOSIS — S42211A Unspecified displaced fracture of surgical neck of right humerus, initial encounter for closed fracture: Secondary | ICD-10-CM | POA: Diagnosis present

## 2019-08-14 DIAGNOSIS — Z79891 Long term (current) use of opiate analgesic: Secondary | ICD-10-CM | POA: Diagnosis not present

## 2019-08-14 DIAGNOSIS — N390 Urinary tract infection, site not specified: Secondary | ICD-10-CM | POA: Diagnosis present

## 2019-08-14 DIAGNOSIS — Y92009 Unspecified place in unspecified non-institutional (private) residence as the place of occurrence of the external cause: Secondary | ICD-10-CM

## 2019-08-14 DIAGNOSIS — R443 Hallucinations, unspecified: Secondary | ICD-10-CM | POA: Diagnosis present

## 2019-08-14 DIAGNOSIS — W19XXXA Unspecified fall, initial encounter: Secondary | ICD-10-CM | POA: Diagnosis present

## 2019-08-14 DIAGNOSIS — G7000 Myasthenia gravis without (acute) exacerbation: Secondary | ICD-10-CM | POA: Diagnosis present

## 2019-08-14 DIAGNOSIS — R0789 Other chest pain: Secondary | ICD-10-CM | POA: Diagnosis present

## 2019-08-14 DIAGNOSIS — R269 Unspecified abnormalities of gait and mobility: Secondary | ICD-10-CM | POA: Diagnosis present

## 2019-08-14 DIAGNOSIS — M818 Other osteoporosis without current pathological fracture: Secondary | ICD-10-CM

## 2019-08-14 DIAGNOSIS — E538 Deficiency of other specified B group vitamins: Secondary | ICD-10-CM

## 2019-08-14 DIAGNOSIS — Z86718 Personal history of other venous thrombosis and embolism: Secondary | ICD-10-CM

## 2019-08-14 DIAGNOSIS — I10 Essential (primary) hypertension: Secondary | ICD-10-CM | POA: Diagnosis present

## 2019-08-14 DIAGNOSIS — Z7952 Long term (current) use of systemic steroids: Secondary | ICD-10-CM

## 2019-08-14 DIAGNOSIS — J9811 Atelectasis: Secondary | ICD-10-CM | POA: Diagnosis present

## 2019-08-14 DIAGNOSIS — Z7901 Long term (current) use of anticoagulants: Secondary | ICD-10-CM

## 2019-08-14 DIAGNOSIS — K2100 Gastro-esophageal reflux disease with esophagitis, without bleeding: Secondary | ICD-10-CM | POA: Diagnosis present

## 2019-08-14 DIAGNOSIS — Z885 Allergy status to narcotic agent status: Secondary | ICD-10-CM

## 2019-08-14 DIAGNOSIS — G250 Essential tremor: Secondary | ICD-10-CM | POA: Diagnosis present

## 2019-08-14 DIAGNOSIS — F0390 Unspecified dementia without behavioral disturbance: Secondary | ICD-10-CM | POA: Diagnosis present

## 2019-08-14 DIAGNOSIS — E871 Hypo-osmolality and hyponatremia: Secondary | ICD-10-CM | POA: Diagnosis not present

## 2019-08-14 DIAGNOSIS — G8929 Other chronic pain: Secondary | ICD-10-CM | POA: Diagnosis present

## 2019-08-14 DIAGNOSIS — M80021A Age-related osteoporosis with current pathological fracture, right humerus, initial encounter for fracture: Principal | ICD-10-CM | POA: Diagnosis present

## 2019-08-14 DIAGNOSIS — I82412 Acute embolism and thrombosis of left femoral vein: Secondary | ICD-10-CM | POA: Diagnosis present

## 2019-08-14 DIAGNOSIS — Z66 Do not resuscitate: Secondary | ICD-10-CM | POA: Diagnosis present

## 2019-08-14 DIAGNOSIS — R7989 Other specified abnormal findings of blood chemistry: Secondary | ICD-10-CM

## 2019-08-14 DIAGNOSIS — R509 Fever, unspecified: Secondary | ICD-10-CM

## 2019-08-14 DIAGNOSIS — R0902 Hypoxemia: Secondary | ICD-10-CM

## 2019-08-14 DIAGNOSIS — Z7189 Other specified counseling: Secondary | ICD-10-CM | POA: Diagnosis not present

## 2019-08-14 DIAGNOSIS — Z515 Encounter for palliative care: Secondary | ICD-10-CM | POA: Diagnosis present

## 2019-08-14 DIAGNOSIS — G9341 Metabolic encephalopathy: Secondary | ICD-10-CM | POA: Diagnosis present

## 2019-08-14 DIAGNOSIS — Z20822 Contact with and (suspected) exposure to covid-19: Secondary | ICD-10-CM | POA: Diagnosis present

## 2019-08-14 DIAGNOSIS — G43909 Migraine, unspecified, not intractable, without status migrainosus: Secondary | ICD-10-CM | POA: Diagnosis present

## 2019-08-14 DIAGNOSIS — M7989 Other specified soft tissue disorders: Secondary | ICD-10-CM

## 2019-08-14 DIAGNOSIS — M542 Cervicalgia: Secondary | ICD-10-CM | POA: Diagnosis present

## 2019-08-14 DIAGNOSIS — K5900 Constipation, unspecified: Secondary | ICD-10-CM | POA: Diagnosis present

## 2019-08-14 DIAGNOSIS — Z87891 Personal history of nicotine dependence: Secondary | ICD-10-CM

## 2019-08-14 DIAGNOSIS — Z7982 Long term (current) use of aspirin: Secondary | ICD-10-CM

## 2019-08-14 DIAGNOSIS — R079 Chest pain, unspecified: Secondary | ICD-10-CM

## 2019-08-14 DIAGNOSIS — Z79899 Other long term (current) drug therapy: Secondary | ICD-10-CM

## 2019-08-14 LAB — URINALYSIS, COMPLETE (UACMP) WITH MICROSCOPIC
Bacteria, UA: NONE SEEN
Bilirubin Urine: NEGATIVE
Glucose, UA: NEGATIVE mg/dL
Hgb urine dipstick: NEGATIVE
Ketones, ur: 5 mg/dL — AB
Leukocytes,Ua: NEGATIVE
Nitrite: NEGATIVE
Protein, ur: NEGATIVE mg/dL
Specific Gravity, Urine: 1.016 (ref 1.005–1.030)
pH: 6 (ref 5.0–8.0)

## 2019-08-14 LAB — BASIC METABOLIC PANEL
Anion gap: 10 (ref 5–15)
BUN: 19 mg/dL (ref 8–23)
CO2: 23 mmol/L (ref 22–32)
Calcium: 8.9 mg/dL (ref 8.9–10.3)
Chloride: 104 mmol/L (ref 98–111)
Creatinine, Ser: 0.73 mg/dL (ref 0.44–1.00)
GFR calc Af Amer: >60 mL/min (ref >60)
GFR calc non Af Amer: >60 mL/min (ref >60)
Glucose, Bld: 98 mg/dL (ref 70–99)
Potassium: 4.2 mmol/L (ref 3.5–5.1)
Sodium: 137 mmol/L (ref 135–145)

## 2019-08-14 LAB — CBC
HCT: 37.7 % (ref 36.0–46.0)
Hemoglobin: 12.5 g/dL (ref 12.0–15.0)
MCH: 30.6 pg (ref 26.0–34.0)
MCHC: 33.2 g/dL (ref 30.0–36.0)
MCV: 92.2 fL (ref 80.0–100.0)
Platelets: 276 10*3/uL (ref 150–400)
RBC: 4.09 MIL/uL (ref 3.87–5.11)
RDW: 12.5 % (ref 11.5–15.5)
WBC: 9.4 10*3/uL (ref 4.0–10.5)
nRBC: 0 % (ref 0.0–0.2)

## 2019-08-14 LAB — SARS CORONAVIRUS 2 (TAT 6-24 HRS): SARS Coronavirus 2: NEGATIVE

## 2019-08-14 MED ORDER — MORPHINE SULFATE (PF) 2 MG/ML IV SOLN
2.0000 mg | Freq: Once | INTRAVENOUS | Status: AC
  Administered 2019-08-14: 2 mg via INTRAVENOUS
  Filled 2019-08-14: qty 1

## 2019-08-14 MED ORDER — ONDANSETRON HCL 4 MG/2ML IJ SOLN
4.0000 mg | Freq: Four times a day (QID) | INTRAMUSCULAR | Status: DC | PRN
  Administered 2019-08-16 – 2019-08-19 (×2): 4 mg via INTRAVENOUS
  Filled 2019-08-14 (×2): qty 2

## 2019-08-14 MED ORDER — MAGNESIUM OXIDE 400 (241.3 MG) MG PO TABS
400.0000 mg | ORAL_TABLET | Freq: Every day | ORAL | Status: DC
  Administered 2019-08-15 – 2019-08-19 (×5): 400 mg via ORAL
  Filled 2019-08-14 (×5): qty 1

## 2019-08-14 MED ORDER — SODIUM CHLORIDE 0.9 % IV SOLN
1.0000 g | INTRAVENOUS | Status: DC
  Administered 2019-08-14: 1 g via INTRAVENOUS
  Filled 2019-08-14: qty 1
  Filled 2019-08-14: qty 10

## 2019-08-14 MED ORDER — PANTOPRAZOLE SODIUM 40 MG PO TBEC
40.0000 mg | DELAYED_RELEASE_TABLET | Freq: Every day | ORAL | Status: DC
  Administered 2019-08-15 – 2019-08-19 (×5): 40 mg via ORAL
  Filled 2019-08-14 (×5): qty 1

## 2019-08-14 MED ORDER — GABAPENTIN 100 MG PO CAPS
100.0000 mg | ORAL_CAPSULE | Freq: Every morning | ORAL | Status: DC
  Administered 2019-08-15 – 2019-08-19 (×5): 100 mg via ORAL
  Filled 2019-08-14 (×5): qty 1

## 2019-08-14 MED ORDER — FLUTICASONE PROPIONATE 50 MCG/ACT NA SUSP
1.0000 | Freq: Every day | NASAL | Status: DC | PRN
  Filled 2019-08-14: qty 16

## 2019-08-14 MED ORDER — SENNA 8.6 MG PO TABS: 1.0000 | ORAL_TABLET | Freq: Two times a day (BID) | ORAL | Status: DC

## 2019-08-14 MED ORDER — SENNOSIDES-DOCUSATE SODIUM 8.6-50 MG PO TABS
1.0000 | ORAL_TABLET | Freq: Two times a day (BID) | ORAL | Status: DC
  Administered 2019-08-14 – 2019-08-15 (×2): 1 via ORAL
  Filled 2019-08-14 (×2): qty 1

## 2019-08-14 MED ORDER — POLYVINYL ALCOHOL 1.4 % OP SOLN
1.0000 [drp] | Freq: Every day | OPHTHALMIC | Status: DC
  Administered 2019-08-15 – 2019-08-19 (×5): 1 [drp] via OPHTHALMIC
  Filled 2019-08-14 (×2): qty 15

## 2019-08-14 MED ORDER — VITAMIN D 25 MCG (1000 UNIT) PO TABS
1000.0000 [IU] | ORAL_TABLET | Freq: Every day | ORAL | Status: DC
  Administered 2019-08-15 – 2019-08-19 (×5): 1000 [IU] via ORAL
  Filled 2019-08-14 (×5): qty 1

## 2019-08-14 MED ORDER — GABAPENTIN 100 MG PO CAPS
200.0000 mg | ORAL_CAPSULE | Freq: Every day | ORAL | Status: DC
  Administered 2019-08-14 – 2019-08-19 (×6): 200 mg via ORAL
  Filled 2019-08-14 (×6): qty 2

## 2019-08-14 MED ORDER — DOCUSATE SODIUM 100 MG PO CAPS
100.0000 mg | ORAL_CAPSULE | Freq: Two times a day (BID) | ORAL | Status: DC
  Administered 2019-08-14: 100 mg via ORAL
  Filled 2019-08-14: qty 1

## 2019-08-14 MED ORDER — PROPRANOLOL HCL 20 MG PO TABS
20.0000 mg | ORAL_TABLET | Freq: Every day | ORAL | Status: DC
  Administered 2019-08-14: 20 mg via ORAL
  Filled 2019-08-14 (×3): qty 1

## 2019-08-14 MED ORDER — HYDROMORPHONE HCL 1 MG/ML IJ SOLN
0.5000 mg | INTRAMUSCULAR | Status: DC | PRN
  Administered 2019-08-14 – 2019-08-16 (×3): 0.5 mg via INTRAVENOUS
  Filled 2019-08-14 (×3): qty 0.5

## 2019-08-14 MED ORDER — ENOXAPARIN SODIUM 40 MG/0.4ML ~~LOC~~ SOLN
40.0000 mg | SUBCUTANEOUS | Status: DC
  Administered 2019-08-14 – 2019-08-17 (×4): 40 mg via SUBCUTANEOUS
  Filled 2019-08-14 (×4): qty 0.4

## 2019-08-14 MED ORDER — SODIUM CHLORIDE 0.9% FLUSH
3.0000 mL | Freq: Two times a day (BID) | INTRAVENOUS | Status: DC
  Administered 2019-08-14 – 2019-08-19 (×8): 3 mL via INTRAVENOUS

## 2019-08-14 MED ORDER — OXYCODONE HCL 5 MG PO TABS
5.0000 mg | ORAL_TABLET | ORAL | Status: DC | PRN
  Administered 2019-08-14 – 2019-08-18 (×6): 10 mg via ORAL
  Administered 2019-08-19: 5 mg via ORAL
  Filled 2019-08-14: qty 1
  Filled 2019-08-14 (×7): qty 2

## 2019-08-14 MED ORDER — RISAQUAD PO CAPS
1.0000 | ORAL_CAPSULE | Freq: Every day | ORAL | Status: DC
  Administered 2019-08-15 – 2019-08-19 (×5): 1 via ORAL
  Filled 2019-08-14 (×5): qty 1

## 2019-08-14 MED ORDER — BISACODYL 10 MG RE SUPP
10.0000 mg | Freq: Every day | RECTAL | Status: DC | PRN
  Filled 2019-08-14: qty 1

## 2019-08-14 MED ORDER — VITAMIN B-12 1000 MCG PO TABS
1000.0000 ug | ORAL_TABLET | Freq: Every day | ORAL | Status: DC
  Administered 2019-08-15 – 2019-08-19 (×5): 1000 ug via ORAL
  Filled 2019-08-14 (×5): qty 1

## 2019-08-14 MED ORDER — ONDANSETRON HCL 4 MG PO TABS: 4.0000 mg | ORAL_TABLET | Freq: Four times a day (QID) | ORAL | Status: DC | PRN

## 2019-08-14 MED ORDER — DARIFENACIN HYDROBROMIDE ER 7.5 MG PO TB24
15.0000 mg | ORAL_TABLET | Freq: Every day | ORAL | Status: DC
  Administered 2019-08-15: 15 mg via ORAL
  Filled 2019-08-14: qty 2
  Filled 2019-08-14 (×2): qty 1

## 2019-08-14 MED ORDER — ONDANSETRON HCL 4 MG/2ML IJ SOLN
4.0000 mg | Freq: Once | INTRAMUSCULAR | Status: AC
  Administered 2019-08-14: 4 mg via INTRAVENOUS
  Filled 2019-08-14: qty 2

## 2019-08-14 NOTE — Progress Notes (Signed)
PT Cancellation Note  Patient Details Name: Lauren Roman MRN: [id number] DOB: 05/17/1928   Cancelled Treatment:    Reason Eval/Treat Not Completed: Other (comment);Patient not medically ready. Pt consult received, chart reviewed. Per chart patient shoulder xray showed: "Acute mildly impacted and angulated fracture of the proximal humerus surgical neck." Pending imaging of hip as well and orthopedic consult. PT evaluation held until pt is medically cleared/ortho consult performed for weight bearing status.   Olga Coaster PT, DPT 12:52 PM,08/14/19

## 2019-08-14 NOTE — ED Notes (Signed)
Patient transported to decon for privacy for I&O cath. Tolerated well.   Patient attempted BM on bed pan, unable aside from small liquid smear. Patient reports constipation recently with last BM 2 days ago. Has been taking 1-2 oxycodone daily for back pain the last few months.

## 2019-08-14 NOTE — ED Triage Notes (Addendum)
Patient states she fell this morning and hit her head and right shoulder. Denies LOC.   Patient's daughter reports patient has had some intermittent confusion and hallucinations for the past 2 days. States PCP started her on abx yesterday for possible UTI. Also complaining of swelling in bilateral feet. Reports her PCP gave her a fluid pill but when he gave her the antibiotics, told her not to give patient the fluid pills. Patient answering questions appropriately in triage.

## 2019-08-14 NOTE — H&P (Signed)
History and Physical   Lauren Roman XHB:[id number] DOB: August 02, 1928 DOA: 08/14/2019  Referring MD/NP/PA: Dr. Fanny Bien, EDP PCP: Danella Penton, MD Outpatient Specialists: Pain management Dr. Cherylann Ratel; Orthopedics Dr. Rosita Kea Patient coming from: Home  Chief Complaint: Fall  HPI: Lauren Roman is an 86 y.o. female with a history of multiple fractures on chronic opioids, osteoporosis, GERD, HTN, HLD, mild dementia, myasthenia gravis not on therapy, DVT no longer on xarelto, vitamin B12 deficiency, GERD who presented from home where she had a witnessed fall. Her brother, with whom she lives saw the patient fall walking to the bathroom without LOC, landing on her right side complaining of immediate severe pain in the right shoulder and in the hip that has remained constant, worse with movement, improved with rest. Over the past few days the patient has been reporting seeing people who are not there which is new for her. This has happened with UTIs in the past, and the patient has had increased urinary frequency lately. Due to this and reported feet swelling, the PCP prescribed an antibiotic and fluid pill which the patient has not yet started. No weakness or numbness. In the ED, vitals stable, labs unremarkable, XR demonstrated angulated right humerus fracture. Urinalysis is pending. Hospitalists called for admission. Note mild confusion limits history by patient, though she does provide some history and reports symptoms reliably. Daughter at bedside used as significant source of information as well.  Review of Systems: Denies fever, chills, cough, sore throat, chest pain, palpitations, shortness of breath, abdominal pain, nausea, vomiting, changes in bowel habits, blood in stool, rash, and per HPI. All others reviewed and are negative.   Past Medical History:  Diagnosis Date   Arthritis    Dementia (HCC)    mild   Headache    migraines   Hepatitis 1952   hep b-no problems since then    Hyperlipidemia    Myasthenia gravis (HCC)    Tremor    right hand   Past Surgical History:  Procedure Laterality Date   COLONOSCOPY     INTRAMEDULLARY (IM) NAIL INTERTROCHANTERIC Left 09/20/2018   Procedure: INTRAMEDULLARY (IM) NAIL INTERTROCHANTRIC;  Surgeon: Kennedy Bucker, MD;  Location: [facility];  Service: Orthopedics;  Laterality: Left;   KYPHOPLASTY N/A 04/08/2019   Procedure: T9 KYPHOPLASTY;  Surgeon: Kennedy Bucker, MD;  Location: [facility];  Service: Orthopedics;  Laterality: N/A;   LUMBAR LAMINECTOMY/DECOMPRESSION MICRODISCECTOMY N/A 01/30/2018   Procedure: LUMBAR LAMINECTOMY/DECOMPRESSION MICRODISCECTOMY 1 LEVEL-L4-5 FAR LATERAL DISCECTOMY;  Surgeon: Venetia Night, MD;  Location: [facility];  Service: Neurosurgery;  Laterality: N/A;   - No tobacco use, EtOH or illicit drugs. Lives with brother and has assistance daily from her daughter.  reports that she quit smoking about 56 years ago. She has never used smokeless tobacco. She reports that she does not drink alcohol or use drugs. Allergies  Allergen Reactions   Exelon [Rivastigmine Tartrate]     Patient unsure of reaction   Hydrocodone-Acetaminophen Nausea Only    Sometimes make her nausea    Other Itching and Rash    Anything metal on skin will cause rash and itching   No family history on file. - Family history otherwise reviewed and not pertinent.  Prior to Admission medications   Medication Sig Start Date End Date Taking? Authorizing Provider  acetaminophen (TYLENOL) 650 MG CR tablet Take 650 mg by mouth as needed for pain.    [provider]  alendronate (FOSAMAX) 70 MG tablet Take by mouth. 05/04/19  05/03/20  [provider]  aspirin EC 81 MG tablet Take 81 mg by mouth daily.    [provider]  Cholecalciferol (VITAMIN D-1000 MAX ST) 1000 units tablet Take 1,000 Units by mouth daily.    [provider]  fluticasone (FLONASE) 50 MCG/ACT nasal spray Place 1 spray into both  nostrils daily.    [provider]  gabapentin (NEURONTIN) 300 MG capsule Take 300 mg by mouth 3 (three) times daily.    [provider]  hydrochlorothiazide (HYDRODIURIL) 25 MG tablet Take 25 mg by mouth daily. 11/29/18   [provider]  magnesium oxide (MAG-OX) 400 MG tablet Take 400 mg by mouth daily.    [provider]  nitrofurantoin (MACRODANTIN) 50 MG capsule Take 50 mg by mouth at bedtime.    [provider]  omeprazole (PRILOSEC) 40 MG capsule Take 40 mg by mouth daily. 09/16/18 09/16/19  [provider]  oxyCODONE (ROXICODONE) 5 MG immediate release tablet Take 1 tablet (5 mg total) by mouth 2 (two) times daily as needed for severe pain. 07/24/19 08/23/19  Edward Jolly, MD  predniSONE (DELTASONE) 5 MG tablet Take 5 mg by mouth daily. 09/05/18   [provider]  Probiotic Product (ALIGN) 4 MG CAPS Take 4 mg by mouth daily.    [provider]  propranolol (INDERAL) 20 MG tablet Take 20 mg by mouth daily. 07/27/18   [provider]  Propylene Glycol (SYSTANE COMPLETE OP) Place 1 drop into both eyes daily.    [provider]  solifenacin (VESICARE) 10 MG tablet Take 10 mg by mouth daily.    [provider]  vitamin B-12 (CYANOCOBALAMIN) 1000 MCG tablet Take 1,000 mcg by mouth daily.    [provider]  vitamin k 100 MCG tablet Take 100 mcg by mouth daily.    [provider]  XARELTO 15 MG TABS tablet Take 15 mg by mouth 2 (two) times daily. 12/02/18   [provider]    Physical Exam: Vitals:   08/14/19 1242 08/14/19 1307 08/14/19 1333  BP: (!) 149/79    Pulse: 81  85  Resp: (!) 21  (!) 23  Temp: (!) 95.5 F (35.3 C) (!) 97.3 F (36.3 C)   TempSrc: Oral Axillary   SpO2: 100%  97%   Constitutional: Elderly female in no distress, calm demeanor Eyes: Lids and conjunctivae normal, PERRL ENMT: Mucous membranes are moist. Posterior pharynx clear of any exudate or  lesions.  Neck: Supple, no masses, no thyromegaly Respiratory: Non-labored breathing room air without accessory muscle use completely supine. Clear breath sounds to auscultation bilaterally Cardiovascular: Regular rate and rhythm, no murmurs, rubs, or gallops. No carotid bruits. No JVD. Trace nonpitting pedal edema. Palpable pedal pulses. Abdomen: Normoactive bowel sounds. Mild suprapubic tenderness without other tenderness, non-distended, and no masses palpated. No hepatosplenomegaly. GU: No indwelling catheter Musculoskeletal: No clubbing / cyanosis. Right arm internally rotated with brisk cap refill in digits, radial pulse symmetric, sensorimotor normal distally. No joint deformity lower extremities. Good ROM, no contractures. Normal muscle tone.  Skin: Warm, dry. RLE warm without overt erythema or wound. No rashes, wounds, or ulcers.  Neurologic: CN II-XII grossly intact. No focal deficits in motor strength or sensation in all extremities.   Psychiatric: Alert and oriented x3. Fair judgment and insight. Mood euthymic with congruent affect.   Labs on Admission: I have personally reviewed following labs and imaging studies  CBC: Recent Labs  Lab 08/14/19 1244  WBC 9.4  HGB 12.5  HCT 37.7  MCV 92.2  PLT 267   Basic Metabolic Panel: Recent Labs  Lab 08/14/19 1244  NA 137  K 4.2  CL 104  CO2 23  GLUCOSE 98  BUN 19  CREATININE 0.73  CALCIUM 8.9   GFR: CrCl cannot be calculated (Unknown ideal weight.). Liver Function Tests: No results for input(s): AST, ALT, ALKPHOS, BILITOT, PROT, ALBUMIN in the last 168 hours. No results for input(s): LIPASE, AMYLASE in the last 168 hours. No results for input(s): AMMONIA in the last 168 hours. Coagulation Profile: No results for input(s): INR, PROTIME in the last 168 hours. Cardiac Enzymes: No results for input(s): CKTOTAL, CKMB, CKMBINDEX, TROPONINI in the last 168 hours. BNP (last 3 results) No results for input(s): PROBNP in the last  8760 hours. HbA1C: No results for input(s): HGBA1C in the last 72 hours. CBG: No results for input(s): GLUCAP in the last 168 hours. Lipid Profile: No results for input(s): CHOL, HDL, LDLCALC, TRIG, CHOLHDL, LDLDIRECT in the last 72 hours. Thyroid Function Tests: No results for input(s): TSH, T4TOTAL, FREET4, T3FREE, THYROIDAB in the last 72 hours. Anemia Panel: No results for input(s): VITAMINB12, FOLATE, FERRITIN, TIBC, IRON, RETICCTPCT in the last 72 hours. Urine analysis:    Component Value Date/Time   COLORURINE YELLOW (A) 12/27/2018 1541   APPEARANCEUR HAZY (A) 12/27/2018 1541   LABSPEC 1.016 12/27/2018 1541   PHURINE 7.0 12/27/2018 1541   GLUCOSEU NEGATIVE 12/27/2018 1541   HGBUR MODERATE (A) 12/27/2018 1541   BILIRUBINUR NEGATIVE 12/27/2018 1541   KETONESUR 5 (A) 12/27/2018 1541   PROTEINUR 100 (A) 12/27/2018 1541   NITRITE NEGATIVE 12/27/2018 1541   LEUKOCYTESUR LARGE (A) 12/27/2018 1541    No results found for this or any previous visit (from the past 240 hour(s)).   Radiological Exams on Admission: DG Shoulder Right  Result Date: 08/14/2019 CLINICAL DATA:  Severe right shoulder pain after fall. EXAM: RIGHT SHOULDER - 2+ VIEW COMPARISON:  None. FINDINGS: Acute mildly impacted and angulated fracture of the proximal humerus surgical neck. No dislocation. Osteopenia. The visualized right lung is clear. Prior lower thoracic kyphoplasty. IMPRESSION: 1. Acute mildly impacted and angulated fracture of the proximal humerus surgical neck. Electronically Signed   By: Titus Dubin M.D.   On: 08/14/2019 12:00   CT Head Wo Contrast  Result Date: 08/14/2019 CLINICAL DATA:  Fall.  Head injury with headache EXAM: CT HEAD WITHOUT CONTRAST CT CERVICAL SPINE WITHOUT CONTRAST TECHNIQUE: Multidetector CT imaging of the head and cervical spine was performed following the standard protocol without intravenous contrast. Multiplanar CT image reconstructions of the cervical spine were also  generated. COMPARISON:  None. FINDINGS: CT HEAD FINDINGS Brain: Mild atrophy. Chronic ischemic changes in the white matter. 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 Negative for acute infarct, hemorrhage, or mass Vascular: Negative for hyperdense vessel Skull: Negative for skull fracture Sinuses/Orbits: Negative Other: None CT CERVICAL SPINE FINDINGS Alignment: Mild anterolisthesis C2-3.  Mild retrolisthesis C3-4. Skull base and vertebrae: Negative for fracture Soft tissues and spinal canal: 12 mm right thyroid nodule. No further imaging is required for this lesion. Atherosclerotic calcification of the carotid bifurcation bilaterally. Disc levels: Disc degeneration and spurring throughout the cervical spine. Mild spinal stenosis C3-4, C4-5, C5-6, C6-7 due to spurring. Upper chest: Lung apices clear bilaterally. Other: None IMPRESSION: 1. No acute intracranial abnormality. Atrophy and chronic microvascular ischemia 2. Negative for cervical spine fracture. Moderate to advanced cervical spondylosis. Electronically Signed   By: Juanda Crumble  Chestine Sporelark M.D.   On: 08/14/2019 11:53   CT Cervical Spine Wo Contrast  Result Date: 08/14/2019 CLINICAL DATA:  Fall.  Head injury with headache EXAM: CT HEAD WITHOUT CONTRAST CT CERVICAL SPINE WITHOUT CONTRAST TECHNIQUE: Multidetector CT imaging of the head and cervical spine was performed following the standard protocol without intravenous contrast. Multiplanar CT image reconstructions of the cervical spine were also generated. COMPARISON:  None. FINDINGS: CT HEAD FINDINGS Brain: Mild atrophy. Chronic ischemic changes in the white matter. 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 Negative for acute infarct, hemorrhage, or mass Vascular: Negative for hyperdense vessel Skull: Negative for skull fracture Sinuses/Orbits: Negative Other: None CT CERVICAL SPINE FINDINGS Alignment: Mild anterolisthesis C2-3.  Mild retrolisthesis C3-4. Skull base and  vertebrae: Negative for fracture Soft tissues and spinal canal: 12 mm right thyroid nodule. No further imaging is required for this lesion. Atherosclerotic calcification of the carotid bifurcation bilaterally. Disc levels: Disc degeneration and spurring throughout the cervical spine. Mild spinal stenosis C3-4, C4-5, C5-6, C6-7 due to spurring. Upper chest: Lung apices clear bilaterally. Other: None IMPRESSION: 1. No acute intracranial abnormality. Atrophy and chronic microvascular ischemia 2. Negative for cervical spine fracture. Moderate to advanced cervical spondylosis. Electronically Signed   By: Marlan Palauharles  Clark M.D.   On: 08/14/2019 11:53   DG Hip Unilat W or Wo Pelvis 2-3 Views Right  Result Date: 08/14/2019 CLINICAL DATA:  Fall EXAM: DG HIP (WITH OR WITHOUT PELVIS) 2-3V RIGHT COMPARISON:  None. FINDINGS: Severe osteopenia. There is no evidence of displaced hip fracture or dislocation. Partially imaged intramedullary nail fixation of the left proximal femur. There is no evidence of arthropathy or other focal bone abnormality. Aortic atherosclerosis. IMPRESSION: Severe osteopenia, which significantly limits sensitivity for fracture. Within this limitation, no evidence of displaced right hip or pelvic fracture. Recommend CT or MRI to more sensitively assess for fracture if there is high clinical concern. Electronically Signed   By: Lauralyn PrimesAlex  Bibbey M.D.   On: 08/14/2019 13:29    EKG: Independently reviewed. Sinus rhythm with significant diffuse artifact limiting interpretation. Prominent P waves suggestive of atrial enlargement with leftward axis. No ST depression or elevation.  Assessment/Plan Active Problems:   Fall at home   Right humerus fracture due to fall at home: XR right shoulder demonstrates impacted, angulated proximal humerus surgical neck fracture. Uses walker for mobility at baseline, now this is complicated by humerus fracture.  - Orthopedics consulted. Well known to Dr. Rosita KeaMenz. No definite  fracture on hip XR. CT pelvis ordered given ongoing pain and severe osteopenia. - PT/OT and TOC consulted - Continue tylenol, gabapentin, oxycodone. Will also give dilaudid prn severe pain - No report consistent with syncope, to limit restraint/intervention will not continue cardiac monitoring.  Acute metabolic encephalopathy on chronic mild dementia: Having progressive decline over past few months with immobility and fractures. Possibly with UTI as precipitant of acute encephalopathy. Labs not overtly remarkable.  - Check UA, urine culture, will plan to treat with ceftriaxone pending culture results, though pt has already had antimicrobial Tx started before collection. - Has not tolerated treatments for dementia per daughter. - Covid PCR pending.  Constipation:  - Start bowel regimen  History of left leg DVT after left hip fracture:  - Was treated with xarelto, no longer on anticoagulation. Will give prophylactic dose lovenox  Essential tremor:  - Continue propranolol  GERD:  - Continue PPI  OAB:  - Continue vesicare formulary alternative.   Osteoporosis, multiple fractures: Per PCP note,  bisphosphonate was stopped due to severe esophagitis. We will not restart this.   Vitamin B12 deficiency:  - Continue supplementation.  DVT prophylaxis: Lovenox  Code Status: Partial, no CPR. Discussed at length with daughter. Will need ongoing discussions.  Family Communication: Daughter at bedside Disposition Plan: Likely to require SNF Consults called: Orthopedic surgery  Admission status: Inpatient  The appropriate admission status for this patient is INPATIENT. Inpatient status is judged to be reasonable and necessary in order to provide the required intensity of service to ensure the patient's safety. The patient's presenting symptoms, physical exam findings, and initial radiographic and laboratory data in the context of their chronic comorbidities is felt to place them at high risk for  further clinical deterioration. Furthermore, it is not anticipated that the patient will be medically stable for discharge from the hospital within 2 midnights of admission. The following factors support the admission status of inpatient.    The patient's presenting symptoms include intractable pain.  The worrisome physical exam findings include right shoulder  and suprapubic tenderness.  The initial radiographic and laboratory data are worrisome because of Right humerus fracture.  The chronic co-morbidities include advanced age, frailty, osteoporosis, gait abnormality, myasthenia gravis, OAB.  Patient requires inpatient status due to high intensity of service, high risk for further deterioration and high frequency of surveillance required.  I certify that at the point of admission it is my clinical judgment that the patient will require inpatient hospital care spanning beyond 2 midnights from the point of admission.      Tyrone Nine, MD [facility] [email or web address] 08/14/2019, 2:02 PM

## 2019-08-14 NOTE — Consult Note (Signed)
ORTHOPAEDIC CONSULTATION  PATIENT NAME: Lauren Roman DOB: [birth year]  MRN: [id number]  REQUESTING PHYSICIAN: Tyrone Nine, MD  Chief Complaint: Right shoulder pain  HPI: Lauren Roman is a 86 y.o. female who complains of severe right shoulder pain.  The patient sustained a witnessed fall at home earlier today as she was walking to the bathroom.  She apparently landed on her right side and complained of the immediate onset of severe right shoulder pain.  She denied any loss of consciousness.  She also complained of pre-existing neck pain as well as a chronic history of back pain (status post kyphoplasty).  Past Medical History:  Diagnosis Date  . Arthritis   . Dementia (HCC)    mild  . Headache    migraines  . Hepatitis 1952   hep b-no problems since then  . Hyperlipidemia   . Myasthenia gravis (HCC)   . Tremor    right hand   Past Surgical History:  Procedure Laterality Date  . COLONOSCOPY    . INTRAMEDULLARY (IM) NAIL INTERTROCHANTERIC Left 09/20/2018   Procedure: INTRAMEDULLARY (IM) NAIL INTERTROCHANTRIC;  Surgeon: Kennedy Bucker, MD;  Location: [facility];  Service: Orthopedics;  Laterality: Left;  . KYPHOPLASTY N/A 04/08/2019   Procedure: T9 KYPHOPLASTY;  Surgeon: Kennedy Bucker, MD;  Location: [facility];  Service: Orthopedics;  Laterality: N/A;  . LUMBAR LAMINECTOMY/DECOMPRESSION MICRODISCECTOMY N/A 01/30/2018   Procedure: LUMBAR LAMINECTOMY/DECOMPRESSION MICRODISCECTOMY 1 LEVEL-L4-5 FAR LATERAL DISCECTOMY;  Surgeon: Venetia Night, MD;  Location: [facility];  Service: Neurosurgery;  Laterality: N/A;   Social History   Socioeconomic History  . Marital status: Single    Spouse name: Not on file  . Number of children: Not on file  . Years of education: Not on file  . Highest education level: Not on file  Occupational History  . Not on file  Tobacco Use  . Smoking status: Former Smoker    Quit date: 01/30/1963    Years since quitting: 56.5  . Smokeless tobacco:  Never Used  . Tobacco comment: only social smoking and not even a full year  Substance and Sexual Activity  . Alcohol use: Never  . Drug use: Never  . Sexual activity: Not on file  Other Topics Concern  . Not on file  Social History Narrative  . Not on file   Social Determinants of Health   Financial Resource Strain:   . Difficulty of Paying Living Expenses:   Food Insecurity: No Food Insecurity  . Worried About Programme researcher, broadcasting/film/video in the Last Year: Never true  . Ran Out of Food in the Last Year: Never true  Transportation Needs: No Transportation Needs  . Lack of Transportation (Medical): No  . Lack of Transportation (Non-Medical): No  Physical Activity: Insufficiently Active  . Days of Exercise per Week: 3 days  . Minutes of Exercise per Session: 40 min  Stress: No Stress Concern Present  . Feeling of Stress : Not at all  Social Connections: Unknown  . Frequency of Communication with Friends and Family: Twice a week  . Frequency of Social Gatherings with Friends and Family: Twice a week  . Attends Religious Services: More than 4 times per year  . Active Member of Clubs or Organizations: Yes  . Attends Banker Meetings: 1 to 4 times per year  . Marital Status: Patient refused   No family history on file. Allergies  Allergen Reactions  . Exelon [Rivastigmine Tartrate]     Patient unsure of  reaction  . Hydrocodone-Acetaminophen Nausea Only    Sometimes make her nausea   . Other Itching and Rash    Anything metal on skin will cause rash and itching   Prior to Admission medications   Medication Sig Start Date End Date Taking? Authorizing Provider  acetaminophen (TYLENOL) 650 MG CR tablet Take 650 mg by mouth as needed for pain.   Yes [provider]  aspirin EC 81 MG tablet Take 81 mg by mouth daily.   Yes [provider]  cefUROXime (CEFTIN) 250 MG tablet Take 250 mg by mouth 2 (two) times daily. 08/13/19  Yes [provider]   Cholecalciferol (VITAMIN D-1000 MAX ST) 1000 units tablet Take 1,000 Units by mouth daily.   Yes [provider]  fluticasone (FLONASE) 50 MCG/ACT nasal spray Place 1 spray into both nostrils daily.   Yes [provider]  gabapentin (NEURONTIN) 100 MG capsule Take 100-200 mg by mouth See admin instructions. Take 1 capsule (100mg ) by mouth every morning and take 2 capsules (200mg ) by mouth every evening   Yes [provider]  magnesium oxide (MAG-OX) 400 MG tablet Take 400 mg by mouth daily.   Yes [provider]  nitrofurantoin (MACRODANTIN) 50 MG capsule Take 50 mg by mouth at bedtime.   Yes [provider]  omeprazole (PRILOSEC) 40 MG capsule Take 40 mg by mouth daily. 09/16/18 09/16/19 Yes [provider]  oxyCODONE (ROXICODONE) 5 MG immediate release tablet Take 1 tablet (5 mg total) by mouth 2 (two) times daily as needed for severe pain. 07/24/19 08/23/19 Yes 09/23/19, MD  predniSONE (DELTASONE) 5 MG tablet Take 5 mg by mouth daily. 09/05/18  Yes [provider]  Probiotic Product (ALIGN) 4 MG CAPS Take 4 mg by mouth daily.   Yes [provider]  propranolol (INDERAL) 20 MG tablet Take 20 mg by mouth daily. 07/27/18  Yes [provider]  Propylene Glycol (SYSTANE COMPLETE OP) Place 1 drop into both eyes daily.   Yes [provider]  solifenacin (VESICARE) 10 MG tablet Take 10 mg by mouth daily.   Yes [provider]  vitamin B-12 (CYANOCOBALAMIN) 1000 MCG tablet Take 1,000 mcg by mouth daily.   Yes [provider]  vitamin k 100 MCG tablet Take 100 mcg by mouth daily.   Yes [provider]   DG Shoulder Right  Result Date: 08/14/2019 CLINICAL DATA:  Severe right shoulder pain after fall. EXAM: RIGHT SHOULDER - 2+ VIEW COMPARISON:  None. FINDINGS: Acute mildly impacted and angulated fracture of the proximal humerus surgical neck. No dislocation. Osteopenia. The visualized right lung  is clear. Prior lower thoracic kyphoplasty. IMPRESSION: 1. Acute mildly impacted and angulated fracture of the proximal humerus surgical neck. Electronically Signed   By: 07/29/18 M.D.   On: 08/14/2019 12:00   CT Head Wo Contrast  Result Date: 08/14/2019 CLINICAL DATA:  Fall.  Head injury with headache EXAM: CT HEAD WITHOUT CONTRAST CT CERVICAL SPINE WITHOUT CONTRAST TECHNIQUE: Multidetector CT imaging of the head and cervical spine was performed following the standard protocol without intravenous contrast. Multiplanar CT image reconstructions of the cervical spine were also generated. COMPARISON:  None. FINDINGS: CT HEAD FINDINGS Brain: Mild atrophy. Chronic ischemic changes in the white matter. 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 Negative for acute infarct, hemorrhage, or mass Vascular: Negative for hyperdense vessel Skull: Negative for skull fracture Sinuses/Orbits: Negative Other: None CT CERVICAL SPINE FINDINGS Alignment: Mild anterolisthesis  C2-3.  Mild retrolisthesis C3-4. Skull base and vertebrae: Negative for fracture Soft tissues and spinal canal: 12 mm right thyroid nodule. No further imaging is required for this lesion. Atherosclerotic calcification of the carotid bifurcation bilaterally. Disc levels: Disc degeneration and spurring throughout the cervical spine. Mild spinal stenosis C3-4, C4-5, C5-6, C6-7 due to spurring. Upper chest: Lung apices clear bilaterally. Other: None IMPRESSION: 1. No acute intracranial abnormality. Atrophy and chronic microvascular ischemia 2. Negative for cervical spine fracture. Moderate to advanced cervical spondylosis. Electronically Signed   By: Marlan Palau M.D.   On: 08/14/2019 11:53   CT Cervical Spine Wo Contrast  Result Date: 08/14/2019 CLINICAL DATA:  Fall.  Head injury with headache EXAM: CT HEAD WITHOUT CONTRAST CT CERVICAL SPINE WITHOUT CONTRAST TECHNIQUE: Multidetector CT imaging of the head and cervical spine was performed  following the standard protocol without intravenous contrast. Multiplanar CT image reconstructions of the cervical spine were also generated. COMPARISON:  None. FINDINGS: CT HEAD FINDINGS Brain: Mild atrophy. Chronic ischemic changes in the white matter. 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 Negative for acute infarct, hemorrhage, or mass Vascular: Negative for hyperdense vessel Skull: Negative for skull fracture Sinuses/Orbits: Negative Other: None CT CERVICAL SPINE FINDINGS Alignment: Mild anterolisthesis C2-3.  Mild retrolisthesis C3-4. Skull base and vertebrae: Negative for fracture Soft tissues and spinal canal: 12 mm right thyroid nodule. No further imaging is required for this lesion. Atherosclerotic calcification of the carotid bifurcation bilaterally. Disc levels: Disc degeneration and spurring throughout the cervical spine. Mild spinal stenosis C3-4, C4-5, C5-6, C6-7 due to spurring. Upper chest: Lung apices clear bilaterally. Other: None IMPRESSION: 1. No acute intracranial abnormality. Atrophy and chronic microvascular ischemia 2. Negative for cervical spine fracture. Moderate to advanced cervical spondylosis. Electronically Signed   By: Marlan Palau M.D.   On: 08/14/2019 11:53   CT PELVIS WO CONTRAST  Result Date: 08/14/2019 CLINICAL DATA:  Fall, pelvic pain EXAM: CT PELVIS WITHOUT CONTRAST TECHNIQUE: Multidetector CT imaging of the pelvis was performed following the standard protocol without intravenous contrast. COMPARISON:  Same day radiographs FINDINGS: Urinary Tract:  Distended urinary bladder. Bowel: Large burden of stool and stool balls in the rectum. Vascular/Lymphatic: No pathologically enlarged lymph nodes. Aortic atherosclerosis. Reproductive:  No mass or other significant abnormality Other:  None. Musculoskeletal: Severe osteopenia. Intramedullary nail fixation of the left femoral neck. IMPRESSION: 1. Severe osteopenia. No acute displaced fracture of the hips or  pelvis. 2. Intramedullary nail fixation of the left femoral neck. No evidence of hardware complication. 3.  Distended urinary bladder.  Correlate for urinary retention. 4.  Large burden of stool and stool balls in the rectum. 5.  Aortic Atherosclerosis (ICD10-I70.0). Electronically Signed   By: Lauralyn Primes M.D.   On: 08/14/2019 15:34   DG Hip Unilat W or Wo Pelvis 2-3 Views Right  Result Date: 08/14/2019 CLINICAL DATA:  Fall EXAM: DG HIP (WITH OR WITHOUT PELVIS) 2-3V RIGHT COMPARISON:  None. FINDINGS: Severe osteopenia. There is no evidence of displaced hip fracture or dislocation. Partially imaged intramedullary nail fixation of the left proximal femur. There is no evidence of arthropathy or other focal bone abnormality. Aortic atherosclerosis. IMPRESSION: Severe osteopenia, which significantly limits sensitivity for fracture. Within this limitation, no evidence of displaced right hip or pelvic fracture. Recommend CT or MRI to more sensitively assess for fracture if there is high clinical concern. Electronically Signed   By: Lauralyn Primes M.D.   On: 08/14/2019 13:29    Positive ROS:  All other systems have been reviewed and were otherwise negative with the exception of those mentioned in the HPI and as above.  Physical Exam: General: Well developed, well nourished elderly female seen in obvious discomfort. HEENT: Atraumatic and normocephalic. Sclera are clear. Extraocular motion is intact. Oropharynx is clear with moist mucosa. Neck: Supple, with some tenderness to palpation on the right paraspinous musculature..  Lungs: Clear to auscultation bilaterally. Cardiovascular: Regular rate and rhythm with normal S1 and S2.  2-3 /6 murmur. No gallops or rubs. Pedal pulses are palpable bilaterally. Homans test is negative bilaterally. No significant pretibial or ankle edema. Abdomen: Soft, nontender, and nondistended. Bowel sounds are present. Skin: No lesions in the area of chief complaint Neurologic:  Awake, alert, and grossly oriented. Sensory function is grossly intact. Motor strength is felt to be 5 over 5 bilaterally with the exception of the right upper extremity that was not assessed secondary to the injury. No clonus or tremor. Good motor coordination. Lymphatic: No axillary or cervical lymphadenopathy  MUSCULOSKELETAL: Examination of the right shoulder demonstrates normal shoulder contour at rest.  A sling is in place.  There is marked tenderness to palpation to the right shoulder.  The patient demonstrated marked guarding with any attempt at gentle range of motion of the shoulder.  No appreciable bruising to the shoulder or upper arm.  Assessment: Impacted right humeral neck fracture  Plan: The findings were discussed in detail with the patient and her daughter.  A towel bolster was placed under the elbow to better support the right upper extremity.  I will place orders for ice to the right shoulder.  I explained that the fracture could be treated nonoperatively with immobilization.  I will also order heat alternating with cold therapy for the neck.  I also reviewed the x-ray and CT findings regarding the hips and informed them that there was no evidence of a hip or pelvis fracture.  Julie-Anne Torain P. Holley Bouche M.D.

## 2019-08-14 NOTE — ED Provider Notes (Signed)
[facility] Emergency Department Provider Note   ____________________________________________   None    (approximate)  I have reviewed the triage vital signs and the nursing notes.   HISTORY  Chief Complaint Fall and Leg Swelling  EM caveat: Patient confusion and altered mental status  HPI Lauren Roman is a 86 y.o. female here for evaluation of her fall   Daughter reports for the last couple of days a suspected she may have a urinary tract infection.  She seems somewhat intermittently confused, had reported seeing people that were not actually around.  Daughter reports that she was started on antibiotics by her primary care doctor just a day or 2 ago for same suspecting a possible UTI but she did not have an actual urine sample taken  She is continued to have some fatigue, confusion.  Today she got up to use the bathroom and she fell.  This was witnessed by family pressure.  She fell onto her right side shoulder and struck her head.  She is complaining of quite a lot right shoulder pain    Past Medical History:  Diagnosis Date  . Arthritis   . Dementia ([place])    mild  . Headache    migraines  . Hepatitis 1952   hep b-no problems since then  . Hyperlipidemia   . Myasthenia gravis ([place])   . Tremor    right hand    Patient Active Problem List   Diagnosis Date Noted  . Fall at home 08/14/2019  . Pressure injury of skin 12/28/2018  . UTI (urinary tract infection) 12/27/2018  . Acute deep vein thrombosis (DVT) of femoral vein of left lower extremity ([place]) 12/10/2018  . HAP (hospital-acquired pneumonia) 11/01/2018  . Closed left hip fracture ([place]) 09/19/2018  . DDD (degenerative disc disease), lumbar 06/20/2018  . Lumbar radiculopathy 01/30/2018  . Adult idiopathic generalized osteoporosis 06/15/2016  . Chronic osteoarthritis 06/15/2016  . B12 deficiency 07/21/2014    Past Surgical History:  Procedure Laterality Date  . COLONOSCOPY     . INTRAMEDULLARY (IM) NAIL INTERTROCHANTERIC Left 09/20/2018   Procedure: INTRAMEDULLARY (IM) NAIL INTERTROCHANTRIC;  Surgeon: Hessie Knows, MD;  Location: [facility] ORS;  Service: Orthopedics;  Laterality: Left;  . KYPHOPLASTY N/A 04/08/2019   Procedure: T9 KYPHOPLASTY;  Surgeon: Hessie Knows, MD;  Location: [facility] ORS;  Service: Orthopedics;  Laterality: N/A;  . LUMBAR LAMINECTOMY/DECOMPRESSION MICRODISCECTOMY N/A 01/30/2018   Procedure: LUMBAR LAMINECTOMY/DECOMPRESSION MICRODISCECTOMY 1 LEVEL-L4-5 FAR LATERAL DISCECTOMY;  Surgeon: Meade Maw, MD;  Location: [facility] ORS;  Service: Neurosurgery;  Laterality: N/A;    Prior to Admission medications   Medication Sig Start Date End Date Taking? Authorizing Provider  acetaminophen (TYLENOL) 650 MG CR tablet Take 650 mg by mouth as needed for pain.    [provider]  alendronate (FOSAMAX) 70 MG tablet Take by mouth. 05/04/19 05/03/20  [provider]  aspirin EC 81 MG tablet Take 81 mg by mouth daily.    [provider]  Cholecalciferol (VITAMIN D-1000 MAX ST) 1000 units tablet Take 1,000 Units by mouth daily.    [provider]  fluticasone (FLONASE) 50 MCG/ACT nasal spray Place 1 spray into both nostrils daily.    [provider]  gabapentin (NEURONTIN) 300 MG capsule Take 300 mg by mouth 3 (three) times daily.    [provider]  hydrochlorothiazide (HYDRODIURIL) 25 MG tablet Take 25 mg by mouth daily. 11/29/18   [provider]  magnesium oxide (MAG-OX) 400 MG tablet  Take 400 mg by mouth daily.    [provider]  nitrofurantoin (MACRODANTIN) 50 MG capsule Take 50 mg by mouth at bedtime.    [provider]  omeprazole (PRILOSEC) 40 MG capsule Take 40 mg by mouth daily. 09/16/18 09/16/19  [provider]  oxyCODONE (ROXICODONE) 5 MG immediate release tablet Take 1 tablet (5 mg total) by mouth 2 (two) times daily as needed for severe pain. 07/24/19 08/23/19  Edward Jolly, MD  predniSONE (DELTASONE) 5 MG tablet Take 5 mg by mouth daily. 09/05/18   [provider]  Probiotic Product (ALIGN) 4 MG CAPS Take 4 mg by mouth daily.    [provider]  propranolol (INDERAL) 20 MG tablet Take 20 mg by mouth daily. 07/27/18   [provider]  Propylene Glycol (SYSTANE COMPLETE OP) Place 1 drop into both eyes daily.    [provider]  solifenacin (VESICARE) 10 MG tablet Take 10 mg by mouth daily.    [provider]  vitamin B-12 (CYANOCOBALAMIN) 1000 MCG tablet Take 1,000 mcg by mouth daily.    [provider]  vitamin k 100 MCG tablet Take 100 mcg by mouth daily.    [provider]  XARELTO 15 MG TABS tablet Take 15 mg by mouth 2 (two) times daily. 12/02/18   [provider]    Allergies Exelon [rivastigmine tartrate], Hydrocodone-acetaminophen, and Other  No family history on file.  Social History Social History   Tobacco Use  . Smoking status: Former Smoker    Quit date: 01/30/1963    Years since quitting: 56.5  . Smokeless tobacco: Never Used  . Tobacco comment: only social smoking and not even a full year  Substance Use Topics  . Alcohol use: Never  . Drug use: Never    Review of Systems  Reports severe right shoulder pain.  Denies neck pain.  Denies loss consciousness.  Family witnessed and she did not pass out  Denies other injury, does have wrist fractures from a fall about 5 or 6 weeks ago that are splinted.  No known Covid exposure    ____________________________________________   PHYSICAL EXAM:  VITAL SIGNS: ED Triage Vitals [08/14/19 1046]  Enc Vitals Group     BP      Pulse      Resp      Temp      Temp src      SpO2      Weight      Height      Head Circumference      Peak Flow      Pain Score 8     Pain Loc      Pain Edu?      Excl. in GC?     Constitutional: Alert and oriented.  Very soft-spoken, hard of hearing.  Slightly  disoriented Eyes: Conjunctivae are normal. Head: Atraumatic for slight contusion of the forehead. Nose: No congestion/rhinnorhea. Mouth/Throat: Mucous membranes are moist. Neck: No stridor.  Cardiovascular: Normal rate, regular rhythm. Grossly normal heart sounds.  Good peripheral circulation. Respiratory: Normal respiratory effort.  No retractions. Lungs CTAB. Gastrointestinal: Soft and nontender. No distention. Musculoskeletal: No lower extremity tenderness nor edema. Neurologic:  Normal speech and language. No gross focal neurologic deficits are appreciated.  Skin:  Skin is warm, dry and intact. No rash noted. Psychiatric: Mood and affect are normal. Speech and behavior are normal.  ____________________________________________   LABS (all labs ordered are listed, but only abnormal results  are displayed)  Labs Reviewed  URINALYSIS, COMPLETE (UACMP) WITH MICROSCOPIC - Abnormal; Notable for the following components:      Result Value   Color, Urine YELLOW (*)    APPearance CLEAR (*)    Ketones, ur 5 (*)    All other components within normal limits  SARS CORONAVIRUS 2 (TAT 6-24 HRS)  URINE CULTURE  BASIC METABOLIC PANEL  CBC   ____________________________________________  EKG  Reviewed inter by me at 1210 Heart rate 99 QRS 80 QTc 460 Left axis deviation.  Some probable baseline artifact.  Appears normal sinus, artifact present.  No obvious evidence of ischemia ____________________________________________  RADIOLOGY  DG Shoulder Right  Result Date: 08/14/2019 CLINICAL DATA:  Severe right shoulder pain after fall. EXAM: RIGHT SHOULDER - 2+ VIEW COMPARISON:  None. FINDINGS: Acute mildly impacted and angulated fracture of the proximal humerus surgical neck. No dislocation. Osteopenia. The visualized right lung is clear. Prior lower thoracic kyphoplasty. IMPRESSION: 1. Acute mildly impacted and angulated fracture of the proximal humerus surgical neck. Electronically Signed   By:  Obie Dredge M.D.   On: 08/14/2019 12:00   CT Head Wo Contrast  Result Date: 08/14/2019 CLINICAL DATA:  Fall.  Head injury with headache EXAM: CT HEAD WITHOUT CONTRAST CT CERVICAL SPINE WITHOUT CONTRAST TECHNIQUE: Multidetector CT imaging of the head and cervical spine was performed following the standard protocol without intravenous contrast. Multiplanar CT image reconstructions of the cervical spine were also generated. COMPARISON:  None. FINDINGS: CT HEAD FINDINGS Brain: Mild atrophy. Chronic ischemic changes in the white matter. 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 Negative for acute infarct, hemorrhage, or mass Vascular: Negative for hyperdense vessel Skull: Negative for skull fracture Sinuses/Orbits: Negative Other: None CT CERVICAL SPINE FINDINGS Alignment: Mild anterolisthesis C2-3.  Mild retrolisthesis C3-4. Skull base and vertebrae: Negative for fracture Soft tissues and spinal canal: 12 mm right thyroid nodule. No further imaging is required for this lesion. Atherosclerotic calcification of the carotid bifurcation bilaterally. Disc levels: Disc degeneration and spurring throughout the cervical spine. Mild spinal stenosis C3-4, C4-5, C5-6, C6-7 due to spurring. Upper chest: Lung apices clear bilaterally. Other: None IMPRESSION: 1. No acute intracranial abnormality. Atrophy and chronic microvascular ischemia 2. Negative for cervical spine fracture. Moderate to advanced cervical spondylosis. Electronically Signed   By: Marlan Palau M.D.   On: 08/14/2019 11:53   CT Cervical Spine Wo Contrast  Result Date: 08/14/2019 CLINICAL DATA:  Fall.  Head injury with headache EXAM: CT HEAD WITHOUT CONTRAST CT CERVICAL SPINE WITHOUT CONTRAST TECHNIQUE: Multidetector CT imaging of the head and cervical spine was performed following the standard protocol without intravenous contrast. Multiplanar CT image reconstructions of the cervical spine were also generated. COMPARISON:  None. FINDINGS:  CT HEAD FINDINGS Brain: Mild atrophy. Chronic ischemic changes in the white matter. 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 Negative for acute infarct, hemorrhage, or mass Vascular: Negative for hyperdense vessel Skull: Negative for skull fracture Sinuses/Orbits: Negative Other: None CT CERVICAL SPINE FINDINGS Alignment: Mild anterolisthesis C2-3.  Mild retrolisthesis C3-4. Skull base and vertebrae: Negative for fracture Soft tissues and spinal canal: 12 mm right thyroid nodule. No further imaging is required for this lesion. Atherosclerotic calcification of the carotid bifurcation bilaterally. Disc levels: Disc degeneration and spurring throughout the cervical spine. Mild spinal stenosis C3-4, C4-5, C5-6, C6-7 due to spurring. Upper chest: Lung apices clear bilaterally. Other: None IMPRESSION: 1. No acute intracranial abnormality. Atrophy and chronic microvascular ischemia 2. Negative for  cervical spine fracture. Moderate to advanced cervical spondylosis. Electronically Signed   By: Marlan Palau M.D.   On: 08/14/2019 11:53   DG Hip Unilat W or Wo Pelvis 2-3 Views Right  Result Date: 08/14/2019 CLINICAL DATA:  Fall EXAM: DG HIP (WITH OR WITHOUT PELVIS) 2-3V RIGHT COMPARISON:  None. FINDINGS: Severe osteopenia. There is no evidence of displaced hip fracture or dislocation. Partially imaged intramedullary nail fixation of the left proximal femur. There is no evidence of arthropathy or other focal bone abnormality. Aortic atherosclerosis. IMPRESSION: Severe osteopenia, which significantly limits sensitivity for fracture. Within this limitation, no evidence of displaced right hip or pelvic fracture. Recommend CT or MRI to more sensitively assess for fracture if there is high clinical concern. Electronically Signed   By: Lauralyn Primes M.D.   On: 08/14/2019 13:29    Imaging reviewed, most notable for right proximal humerus  fracture ____________________________________________   PROCEDURES  Procedure(s) performed: None  Procedures  Critical Care performed: No  ____________________________________________   INITIAL IMPRESSION / ASSESSMENT AND PLAN / ED COURSE  Pertinent labs & imaging results that were available during my care of the patient were reviewed by me and considered in my medical decision making (see chart for details).   Fall.  In the setting of confusion thought possibly due to UTI with treatment by primary.  Currently slight confusion, significant pain due to right shoulder injury and fracture.  This will cause significant problems for her home care and mobility as she is already using a walker, and family reports they would be unable to care for her at home now due to mobility changes  CT imaging of the head neck reassuring.  Some discomfort that she reports over the right pelvic region, but no shortening or rotation of the leg.  No gross fracture noted on imaging.  Will be admitted for further care and work-up.  Given the patient's pain, immobility changes need for social work consultation, in anticipation that she will require further grammatical care including potential changes to medications, further assessment for confusion admit the patient to hospital service pending further work-up  ----------------------------------------- 2:19 PM on 08/14/2019 -----------------------------------------        ____________________________________________   FINAL CLINICAL IMPRESSION(S) / ED DIAGNOSES  Final diagnoses:  Shoulder fracture, right, closed, initial encounter  Delirium        Note:  This document was prepared using Dragon voice recognition software and may include unintentional dictation errors       Sharyn Creamer, MD 08/14/19 1440

## 2019-08-15 ENCOUNTER — Inpatient Hospital Stay: Payer: Medicare Other

## 2019-08-15 DIAGNOSIS — Z66 Do not resuscitate: Secondary | ICD-10-CM

## 2019-08-15 DIAGNOSIS — Z515 Encounter for palliative care: Secondary | ICD-10-CM

## 2019-08-15 DIAGNOSIS — Z7189 Other specified counseling: Secondary | ICD-10-CM

## 2019-08-15 LAB — CBC
HCT: 33.4 % — ABNORMAL LOW (ref 36.0–46.0)
Hemoglobin: 11 g/dL — ABNORMAL LOW (ref 12.0–15.0)
MCH: 30.4 pg (ref 26.0–34.0)
MCHC: 32.9 g/dL (ref 30.0–36.0)
MCV: 92.3 fL (ref 80.0–100.0)
Platelets: 271 10*3/uL (ref 150–400)
RBC: 3.62 MIL/uL — ABNORMAL LOW (ref 3.87–5.11)
RDW: 12.6 % (ref 11.5–15.5)
WBC: 8.5 10*3/uL (ref 4.0–10.5)
nRBC: 0 % (ref 0.0–0.2)

## 2019-08-15 LAB — URINE CULTURE
Culture: NO GROWTH
Special Requests: NORMAL

## 2019-08-15 LAB — BASIC METABOLIC PANEL
Anion gap: 8 (ref 5–15)
BUN: 17 mg/dL (ref 8–23)
CO2: 25 mmol/L (ref 22–32)
Calcium: 8.8 mg/dL — ABNORMAL LOW (ref 8.9–10.3)
Chloride: 107 mmol/L (ref 98–111)
Creatinine, Ser: 0.81 mg/dL (ref 0.44–1.00)
GFR calc Af Amer: >60 mL/min (ref >60)
GFR calc non Af Amer: >60 mL/min (ref >60)
Glucose, Bld: 92 mg/dL (ref 70–99)
Potassium: 4 mmol/L (ref 3.5–5.1)
Sodium: 140 mmol/L (ref 135–145)

## 2019-08-15 LAB — TROPONIN I (HIGH SENSITIVITY)
Troponin I (High Sensitivity): 9 ng/L (ref <18)
Troponin I (High Sensitivity): 10 ng/L (ref <18)

## 2019-08-15 MED ORDER — MORPHINE SULFATE (PF) 2 MG/ML IV SOLN
2.0000 mg | INTRAVENOUS | Status: DC | PRN
  Administered 2019-08-15: 2 mg via INTRAVENOUS
  Filled 2019-08-15: qty 1

## 2019-08-15 MED ORDER — NITROGLYCERIN 0.4 MG SL SUBL
0.4000 mg | SUBLINGUAL_TABLET | SUBLINGUAL | Status: DC | PRN
  Administered 2019-08-15: 0.4 mg via SUBLINGUAL
  Filled 2019-08-15: qty 1

## 2019-08-15 MED ORDER — SENNOSIDES-DOCUSATE SODIUM 8.6-50 MG PO TABS
2.0000 | ORAL_TABLET | Freq: Two times a day (BID) | ORAL | Status: DC
  Administered 2019-08-15 – 2019-08-19 (×8): 2 via ORAL
  Filled 2019-08-15 (×10): qty 2

## 2019-08-15 MED ORDER — POLYETHYLENE GLYCOL 3350 17 G PO PACK
17.0000 g | PACK | Freq: Every day | ORAL | Status: DC
  Administered 2019-08-15 – 2019-08-19 (×4): 17 g via ORAL
  Filled 2019-08-15 (×5): qty 1

## 2019-08-15 NOTE — Evaluation (Signed)
Physical Therapy Evaluation Patient Details Name: Lauren Roman MRN: [id number] DOB: May 18, 1928 Today's Date: 08/15/2019   History of Present Illness  Pt is a 86 y.o. female that presented to ED after a fall, xray showed Acute mildly impacted and angulated fracture of the proximal humerus surgical neck. Per ortho plan is immobilization, and no evidence of pelvic fx. PMH of arthritis, dementia, migraine, hepatitis, hyperlipidemia, MG, tremor (r hand), previous falls, L hip fx, kyphoplasty. Pt also noted to have L wrist fracture, currently in wrist brace.    Clinical Impression  Pt easily roused at start of session, seen by PT/OT for pt/therapist safety and to maximize mobility. Pt oriented to self and place. Pt able to move LEs against gravity, and able to initiate attempting to sit EOB. Ultimately maxAx2 to achieve. Extended time in sitting at EOB; pt displayed instances of being able to maintain balance with CGA/supervision, majority of time pt needed minA. Cued for LUE propping and feet supported on floor, as well as anterior lean. Sit <> stand with maxAx2 and handheld assist, maxA pericare performed due to small BM. Pt needed step by step cueing for foot placement and posture. Pt returned to supine maxAx2, RN staff informed of pt's probable need of assistance to eat breakfast. Overall the patient demonstrated deficits (see "PT Problem List") that impede the patient's functional abilities, safety, and mobility and would benefit from skilled PT intervention. Recommendation is SNF due to current level of assistance needed and acute decline in functional mobility.       Follow Up Recommendations SNF    Equipment Recommendations  Other (comment)(hemiwalker)    Recommendations for Other Services       Precautions / Restrictions Precautions Precautions: Fall;Shoulder Shoulder Interventions: At all times;Shoulder sling/immobilizer Precaution Booklet Issued: No Required Braces or Orthoses:  Other Brace Other Brace: L Velcro wrist brace Restrictions Weight Bearing Restrictions: Yes RUE Weight Bearing: Non weight bearing LUE Weight Bearing: (Pt able to put weight while wearing velcro brace) Other Position/Activity Restrictions: per ortho consult and follow up notes in 05/2019, "wear L wrist brace at all times except for showering"'; brace will "allow her to begin grasping her wheelchair to assist with ambulation."      Mobility  Bed Mobility Overal bed mobility: Needs Assistance Bed Mobility: Supine to Sit;Sit to Supine     Supine to sit: Max assist;+2 for physical assistance Sit to supine: Max assist;+2 for physical assistance      Transfers Overall transfer level: Needs assistance Equipment used: 2 person hand held assist(PT use of gait belt, OT holding pt's L hand) Transfers: Sit to/from Stand Sit to Stand: Mod assist;Max assist;+2 physical assistance;From elevated surface         General transfer comment: max cues for hand/foot placement, balance  Ambulation/Gait             General Gait Details: deferred due to safety concerns  Stairs            Wheelchair Mobility    Modified Rankin (Stroke Patients Only)       Balance Overall balance assessment: Needs assistance Sitting-balance support: Single extremity supported;Feet supported Sitting balance-Leahy Scale: Fair Sitting balance - Comments: intermittently able to maintain balance briefly with CGA and Max VC, most times requiring Min A to support static sitting balance Postural control: Posterior lean Standing balance support: Single extremity supported Standing balance-Leahy Scale: Poor Standing balance comment: +2 for STS trials with heavy posterior lean, unable to maintain static standing balance without +  2 assist                             Pertinent Vitals/Pain Pain Assessment: Faces Faces Pain Scale: Hurts whole lot Pain Location: "large" amount of pain in R  shoulder/arm Pain Descriptors / Indicators: Grimacing;Guarding;Moaning Pain Intervention(s): Limited activity within patient's tolerance;Repositioned;Ice applied;Monitored during session;Premedicated before session    Home Living Family/patient expects to be discharged to:: Private residence Living Arrangements: Other relatives(brother) Available Help at Discharge: Family Type of Home: House Home Access: Stairs to enter Entrance Stairs-Rails: Right Entrance Stairs-Number of Steps: 3 carport; 4 at front door Home Layout: One level Home Equipment: Walker - 2 wheels;Grab bars - tub/shower;Grab bars - toilet;Cane - single point Additional Comments: Pt unable to provide history, information on PLOF/home set up from chart review    Prior Function Level of Independence: Needs assistance         Comments: difficult to determine 2/2 pt's hx of cognitive deficits, pt requested therapy not call her daughter at time of assessment     Hand Dominance   Dominant Hand: Right    Extremity/Trunk Assessment   Upper Extremity Assessment Upper Extremity Assessment: Defer to OT evaluation RUE Deficits / Details: immobilized in sling RUE: Unable to fully assess due to pain;Unable to fully assess due to immobilization LUE Deficits / Details: L wrist brace in place    Lower Extremity Assessment Lower Extremity Assessment: Generalized weakness(decreased DF bilaterally, able to perform heel slides/hip abduction without assist)    Cervical / Trunk Assessment Cervical / Trunk Assessment: Kyphotic  Communication   Communication: HOH  Cognition Arousal/Alertness: Awake/alert(Pt unable to keep eyes open but would upon request open briefly) Behavior During Therapy: WFL for tasks assessed/performed Overall Cognitive Status: History of cognitive impairments - at baseline                                 General Comments: Alert and oriented to self and generally to place (hospital);  follows simple commands with verbal and tactile cues      General Comments General comments (skin integrity, edema, etc.): spO2 assessed intermittently due to pt complaints of dizziness, upon sitting up initially 85-88, with cueing increased to >90%    Exercises Other Exercises Other Exercises: Pt encouraged to mobilize LEs as much as possible during the day Other Exercises: RUE sling repositioned for optimal comfort and joint support Other Exercises: Max A for pericare while +2 assisting with standing   Assessment/Plan    PT Assessment Patient needs continued PT services  PT Problem List Decreased strength;Decreased mobility;Decreased range of motion;Decreased knowledge of precautions;Decreased activity tolerance;Decreased balance;Decreased knowledge of use of DME;Pain       PT Treatment Interventions DME instruction;Therapeutic activities;Gait training;Therapeutic exercise;Patient/family education;Stair training;Balance training;Functional mobility training;Neuromuscular re-education    PT Goals (Current goals can be found in the Care Plan section)  Acute Rehab PT Goals Patient Stated Goal: to get better PT Goal Formulation: With patient Time For Goal Achievement: 08/29/19 Potential to Achieve Goals: Fair    Frequency 7X/week   Barriers to discharge        Co-evaluation   Reason for Co-Treatment: For patient/therapist safety;To address functional/ADL transfers PT goals addressed during session: Mobility/safety with mobility;Balance;Strengthening/ROM OT goals addressed during session: ADL's and self-care;Proper use of Adaptive equipment and DME       AM-PAC PT "6 Clicks" Mobility  Outcome  Measure Help needed turning from your back to your side while in a flat bed without using bedrails?: Total Help needed moving from lying on your back to sitting on the side of a flat bed without using bedrails?: Total Help needed moving to and from a bed to a chair (including a  wheelchair)?: Total Help needed standing up from a chair using your arms (e.g., wheelchair or bedside chair)?: Total Help needed to walk in hospital room?: Total Help needed climbing 3-5 steps with a railing? : Total 6 Click Score: 6    End of Session Equipment Utilized During Treatment: Gait belt Activity Tolerance: Patient limited by pain;Patient limited by lethargy Patient left: in bed;with call bell/phone within reach;with bed alarm set Nurse Communication: Mobility status PT Visit Diagnosis: Other abnormalities of gait and mobility (R26.89);Muscle weakness (generalized) (M62.81);History of falling (Z91.81);Pain Pain - Right/Left: Right Pain - part of body: Shoulder(and R leg)    Time: 2951-8841 PT Time Calculation (min) (ACUTE ONLY): 37 min   Charges:   PT Evaluation $PT Eval Moderate Complexity: 1 Mod PT Treatments $Therapeutic Activity: 8-22 mins       Olga Coaster PT, DPT 11:08 AM,08/15/19

## 2019-08-15 NOTE — Evaluation (Signed)
Occupational Therapy Evaluation Patient Details Name: Lauren Roman MRN: [id number] DOB: 20-Aug-1928 Today's Date: 08/15/2019    History of Present Illness Pt is a 86 y.o. female that presented to ED after a fall, xray showed Acute mildly impacted and angulated fracture of the proximal humerus surgical neck. Per ortho plan is immobilization, and no evidence of pelvic fx. PMH of arthritis, dementia, migraine, hepatitis, hyperlipidemia, MG, tremor (r hand), previous falls, L hip fx, kyphoplasty. Pt also noted to have L wrist fracture, currently in wrist brace.   Clinical Impression   Patient was seen for an OT evaluation this date. Pt lives with her brother and gets assist from family, per chart. Pt with hx of cognitive impairments, limiting assessment of PLOF and home set up/assist. Pt requesting therapy not call her daughter during session. Pt has orders for RUE to be immobilized and will be NWBing per MD, non-surgical at this time. Pt also with L velcro wrist splint donned. Patient presents with impaired cognition, strength/ROM, pain, activity tolerance, and balance. These impairments result in a decreased ability to perform self care tasks requiring Min-Mod A for grooming and self feeding, Max assist for UB/LB dressing and bathing and max assist for application of sling/immobilizer. Pt instructed in functional ADL transfer training, requiring +2 mod-max assist for sit to stand and to maintain static standing balance with max verbal cues for sequencing and safety. OT adjusted sling/immobilizer to improve comfort, optimize positioning and maximize skin integrity/safety. Pt verbalized understanding of all education/training provided. Pt will benefit from skilled OT services to address these limitations and improve independence in daily tasks. Recommend SNF services to continue therapy to maximize return to PLOF, address home/routines modifications and safety, minimize falls risk, and minimize caregiver  burden.      Follow Up Recommendations  SNF    Equipment Recommendations  3 in 1 bedside commode    Recommendations for Other Services       Precautions / Restrictions Precautions Precautions: Fall;Shoulder Shoulder Interventions: At all times;Shoulder sling/immobilizer Precaution Booklet Issued: No Required Braces or Orthoses: Other Brace Other Brace: L Velcro wrist brace Restrictions Weight Bearing Restrictions: Yes RUE Weight Bearing: Non weight bearing LUE Weight Bearing: (Pt able to put weight through LUE with wrist brace on) Other Position/Activity Restrictions: per ortho consult and follow up notes in 05/2019, "wear L wrist brace at all times except for showering"'; brace will "allow her to begin grasping her wheelchair to assist with ambulation."      Mobility Bed Mobility Overal bed mobility: Needs Assistance Bed Mobility: Supine to Sit;Sit to Supine     Supine to sit: Max assist;+2 for physical assistance Sit to supine: Max assist;+2 for physical assistance      Transfers Overall transfer level: Needs assistance Equipment used: 2 person hand held assist Transfers: Sit to/from Stand Sit to Stand: Mod assist;Max assist;+2 physical assistance;From elevated surface         General transfer comment: max cues for hand/foot placement, balance    Balance Overall balance assessment: Needs assistance Sitting-balance support: Single extremity supported;Feet supported Sitting balance-Leahy Scale: Fair Sitting balance - Comments: intermittently able to maintain balance briefly with CGA and Max VC, most times requiring Min A to support static sitting balance Postural control: Posterior lean Standing balance support: Single extremity supported Standing balance-Leahy Scale: Poor Standing balance comment: +2 for STS trials with heavy posterior lean, unable to maintain static standing balance without +2 assist  ADL either performed  or assessed with clinical judgement   ADL                                         General ADL Comments: Max A for LB ADL and UB ADL tasks 2/2 impairments, VC for safety/sequencing     Vision Patient Visual Report: No change from baseline       Perception     Praxis      Pertinent Vitals/Pain Pain Assessment: Faces Faces Pain Scale: Hurts whole lot Pain Location: "large" amount of pain in R shoulder/arm Pain Descriptors / Indicators: Grimacing;Guarding;Moaning Pain Intervention(s): Limited activity within patient's tolerance;Monitored during session;Repositioned;Premedicated before session;Ice applied     Hand Dominance Right   Extremity/Trunk Assessment Upper Extremity Assessment Upper Extremity Assessment: Generalized weakness;RUE deficits/detail;LUE deficits/detail RUE Deficits / Details: immobilized in sling; grip strength 3+5 RUE: Unable to fully assess due to immobilization;Unable to fully assess due to pain LUE Deficits / Details: L wrist brace in place, grip strength 4-/5   Lower Extremity Assessment Lower Extremity Assessment: Generalized weakness(R hip pain; decr DF bilat)       Communication Communication Communication: HOH   Cognition Arousal/Alertness: Awake/alert(pt unable to keep eyes open but would briefly upon request) Behavior During Therapy: WFL for tasks assessed/performed Overall Cognitive Status: History of cognitive impairments - at baseline                                 General Comments: Alert and oriented to self and generally to place (hospital); follows simple commands with verbal and tactile cues   General Comments  scabs noted on L fingers    Exercises Other Exercises Other Exercises: functional transfer training +2 assist and max VC for safety/sequencing Other Exercises: RUE sling repositioned for optimal comfort and joint support Other Exercises: Max A for pericare while +2 assisting with standing    Shoulder Instructions      Home Living Family/patient expects to be discharged to:: Private residence Living Arrangements: Other relatives(brother) Available Help at Discharge: Family Type of Home: House Home Access: Stairs to enter Secretary/administrator of Steps: 3 carport; 4 at front door Entrance Stairs-Rails: Right Home Layout: One level     Bathroom Shower/Tub: Tub/shower unit;Curtain   Firefighter: Standard     Home Equipment: Environmental consultant - 2 wheels;Grab bars - tub/shower;Grab bars - toilet;Cane - single point   Additional Comments: Pt unable to provide history, information on PLOF/home set up from chart review      Prior Functioning/Environment Level of Independence: Needs assistance        Comments: difficult to determine 2/2 pt's hx of cognitive deficits, pt requested therapy not call her daughter at time of assessment        OT Problem List: Decreased strength;Pain;Decreased range of motion;Decreased cognition;Decreased safety awareness;Decreased activity tolerance;Impaired balance (sitting and/or standing);Decreased knowledge of use of DME or AE;Impaired UE functional use      OT Treatment/Interventions: Self-care/ADL training;Therapeutic exercise;Therapeutic activities;Cognitive remediation/compensation;DME and/or AE instruction;Patient/family education;Balance training    OT Goals(Current goals can be found in the care plan section) Acute Rehab OT Goals Patient Stated Goal: get better and mow the yard again OT Goal Formulation: With patient Time For Goal Achievement: 08/29/19 Potential to Achieve Goals: Good ADL Goals Pt Will Perform Eating: with supervision;with set-up;sitting(with VC as  needed for strategies to maximize safety) Pt Will Transfer to Toilet: with min assist;bedside commode;stand pivot transfer Additional ADL Goal #1: Pt will guard RUE while in sling with occasional verbal cues during ADL and ADL mobility to maximize safety.  OT Frequency:  Min 2X/week   Barriers to D/C:            Co-evaluation PT/OT/SLP Co-Evaluation/Treatment: Yes Reason for Co-Treatment: For patient/therapist safety;To address functional/ADL transfers PT goals addressed during session: Mobility/safety with mobility;Balance OT goals addressed during session: ADL's and self-care;Proper use of Adaptive equipment and DME      AM-PAC OT "6 Clicks" Daily Activity     Outcome Measure Help from another person eating meals?: A Little Help from another person taking care of personal grooming?: A Little Help from another person toileting, which includes using toliet, bedpan, or urinal?: A Lot Help from another person bathing (including washing, rinsing, drying)?: A Lot Help from another person to put on and taking off regular upper body clothing?: A Lot Help from another person to put on and taking off regular lower body clothing?: A Lot 6 Click Score: 14   End of Session Equipment Utilized During Treatment: Gait belt;Other (comment)(L velcro wrist brace, RUE sling)  Activity Tolerance: Patient limited by pain Patient left: in bed;with call bell/phone within reach;with bed alarm set(ice reapplied to R shoulder per pt's request)  OT Visit Diagnosis: Other abnormalities of gait and mobility (R26.89);Repeated falls (R29.6);Muscle weakness (generalized) (M62.81);Pain Pain - Right/Left: Right Pain - part of body: Shoulder;Hip                Time: 6948-5462 OT Time Calculation (min): 37 min Charges:  OT General Charges $OT Visit: 1 Visit OT Evaluation $OT Eval Moderate Complexity: 1 Mod OT Treatments $Therapeutic Activity: 8-22 mins  Jeni Salles, MPH, MS, OTR/L ascom [phone] 08/15/19, 10:26 AM

## 2019-08-15 NOTE — Evaluation (Signed)
Clinical/Bedside Swallow Evaluation Patient Details  Name: Lauren Roman MRN: [id number] Date of Birth: 1929/02/21  Today's Date: 08/15/2019 Time: SLP Start Time (ACUTE ONLY): 1136 SLP Stop Time (ACUTE ONLY): 1236 SLP Time Calculation (min) (ACUTE ONLY): 60 min  Past Medical History:  Past Medical History:  Diagnosis Date  . Arthritis   . Dementia (HCC)    mild  . Headache    migraines  . Hepatitis 1952   hep b-no problems since then  . Hyperlipidemia   . Myasthenia gravis (HCC)   . Tremor    right hand   Past Surgical History:  Past Surgical History:  Procedure Laterality Date  . COLONOSCOPY    . INTRAMEDULLARY (IM) NAIL INTERTROCHANTERIC Left 09/20/2018   Procedure: INTRAMEDULLARY (IM) NAIL INTERTROCHANTRIC;  Surgeon: Kennedy Bucker, MD;  Location: [facility] OR[facility];  Service: Orthopedics;  Laterality: Left;  . KYPHOPLASTY N/A 04/08/2019   Procedure: T9 KYPHOPLASTY;  Surgeon: Kennedy Bucker, MD;  Location: [facility] OR[facility];  Service: Orthopedics;  Laterality: N/A;  . LUMBAR LAMINECTOMY/DECOMPRESSION MICRODISCECTOMY N/A 01/30/2018   Procedure: LUMBAR LAMINECTOMY/DECOMPRESSION MICRODISCECTOMY 1 LEVEL-L4-5 FAR LATERAL DISCECTOMY;  Surgeon: Venetia Night, MD;  Location: [facility];  Service: Neurosurgery;  Laterality: N/A;   HPI:  H&P 08/14/2019: Lauren Roman is an 86 y.o. female with a history of multiple fractures on chronic opioids, osteoporosis, GERD, HTN, HLD, mild dementia, myasthenia gravis not on therapy, DVT no longer on xarelto, vitamin B12 deficiency, GERD who presented from home where she had a witnessed fall. Her brother, with whom she lives saw the patient fall walking to the bathroom without LOC, landing on her right side complaining of immediate severe pain in the right shoulder and in the hip that has remained constant, worse with movement, improved with rest. Over the past few days the patient has been reporting seeing people who are not there which is new for her. This has happened  with UTIs in the past, and the patient has had increased urinary frequency lately. Due to this and reported feet swelling, the PCP prescribed an antibiotic and fluid pill which the patient has not yet started. No weakness or numbness. In the ED, vitals stable, labs unremarkable, XR demonstrated angulated right humerus fracture. Urinalysis is pending. Hospitalists called for admission. Note mild confusion limits history by patient, though she does provide some history and reports symptoms reliably. Daughter at bedside used as significant source of information as well.    Barium Swallow Study 07/28/2019: "Normal pharyngeal anatomy and motility. Contrast flowed freely through the esophagus without evidence of a stricture or mass. Normal esophageal mucosa without evidence of irregularity or ulceration. Intermittent tertiary contractions of the esophagus as can be seen with mild spasm. Mild gastroesophageal reflux. No definite hiatal hernia was demonstrated.  The patient was unable to swallow a barium tablet."   Assessment / Plan / Recommendation Clinical Impression  The patient is presenting with functional oropharyngeal swallowing without significant risk for prandial aspiration.  The patient has documented esophageal dysphagia that is manifested as choking with foods.  The patient and her daughter were educated regarding the barium swallow study and that she is not at risk for aspiration.  They were counseled to take small bites/sips, alternate liquids/solids, sit upright for PO intake, and stay upright after meals.   SLP Visit Diagnosis: Dysphagia, unspecified (R13.10)    Aspiration Risk  No limitations    Diet Recommendation Dysphagia 3 (Mech soft);Thin liquid   Liquid Administration via: Straw Medication Administration: Whole meds with liquid  Supervision: Comment(Requires feeder) Compensations: Minimize environmental distractions;Slow rate;Small sips/bites;Use straw to facilitate chin tuck;Follow  solids with liquid    Other  Recommendations Oral Care Recommendations: Oral care BID;Staff/trained caregiver to provide oral care   Follow up Recommendations None      Frequency and Duration            Prognosis Prognosis for Safe Diet Advancement: Good      Swallow Study   General Date of Onset: 08/14/19 HPI: H&P 08/14/2019: Lauren Roman is an 86 y.o. female with a history of multiple fractures on chronic opioids, osteoporosis, GERD, HTN, HLD, mild dementia, myasthenia gravis not on therapy, DVT no longer on xarelto, vitamin B12 deficiency, GERD who presented from home where she had a witnessed fall. Her brother, with whom she lives saw the patient fall walking to the bathroom without LOC, landing on her right side complaining of immediate severe pain in the right shoulder and in the hip that has remained constant, worse with movement, improved with rest. Over the past few days the patient has been reporting seeing people who are not there which is new for her. This has happened with UTIs in the past, and the patient has had increased urinary frequency lately. Due to this and reported feet swelling, the PCP prescribed an antibiotic and fluid pill which the patient has not yet started. No weakness or numbness. In the ED, vitals stable, labs unremarkable, XR demonstrated angulated right humerus fracture. Urinalysis is pending. Hospitalists called for admission. Note mild confusion limits history by patient, though she does provide some history and reports symptoms reliably. Daughter at bedside used as significant source of information as well.  Barium Swallow Study 07/28/2019: "Normal pharyngeal anatomy and motility. Contrast flowed freely through the esophagus without evidence of a stricture or mass. Normal esophageal mucosa without evidence of irregularity or ulceration. Intermittent tertiary contractions of the esophagus as can be seen with mild spasm. Mild gastroesophageal reflux. No definite  hiatal hernia was demonstrated.  The patient was unable to swallow a barium tablet." Type of Study: Bedside Swallow Evaluation Previous Swallow Assessment: Barium Swallow Diet Prior to this Study: Dysphagia 3 (soft);Thin liquids Behavior/Cognition: Alert;Cooperative;Pleasant mood Oral Cavity Assessment: Within Functional Limits Self-Feeding Abilities: Total assist Patient Positioning: Upright in bed Baseline Vocal Quality: Normal Volitional Cough: Strong Volitional Swallow: Able to elicit    Oral/Motor/Sensory Function Overall Oral Motor/Sensory Function: Within functional limits   Ice Chips     Thin Liquid Thin Liquid: Within functional limits Presentation: Straw    Nectar Thick     Honey Thick     Puree     Solid     Solid: Within functional limits Presentation: Sophronia Simas)     Leroy Sea, MS/CCC- SLP  Valetta Fuller, Daine Floras 08/15/2019,12:37 PM

## 2019-08-15 NOTE — Consult Note (Addendum)
Consultation Note Date: 08/15/2019   Patient Name: Lauren Roman  DOB: 1929-04-06  MRN: [id number]  Age / Sex: 86 y.o., female  PCP: Rusty Aus, MD Referring Physician: Patrecia Pour, MD  Reason for Consultation: Establishing goals of care  HPI/Patient Profile: 86 y.o. female  with past medical history of multiple fractures, chronic opioid use, osteoporosis, GERD, HTN, HLD, mild dementia, myasthenia gravis, DVT, and GERD admitted on 08/14/2019 with witnessed fall. Reports of hallucinations at home and PCP treated patient for UTI. Work up in ED revealed fractured R humerus. PMT consulted for [place].  Clinical Assessment and Goals of Care: I have reviewed medical records including EPIC notes, labs and imaging, received report from RN, assessed the patient and then met with patient and daughter, Lauren Roman, to discuss diagnosis prognosis, GOC, EOL wishes, disposition and options.  I introduced Palliative Medicine as specialized medical care for people living with serious illness. It focuses on providing relief from the symptoms and stress of a serious illness. The goal is to improve quality of life for both the patient and the family.  Patient wakes some during conversation and communicates appropriately but quickly goes back to sleep. Only complaint is feeling like something is stuck in her throat - ongoing issues per daughter.   We discussed a brief life review of the patient. Lauren Roman shares that patient lives with her brother. Lauren Roman serves as caregiver. She shares they are a close knit family - just Lauren Roman, her spouse, patient, and patient's brother.   As far as functional and nutritional status, Lauren Roman tells me prior to fall patient was ambulatory with walker. She tells me patient has a good appetite. She tells me that patient's dementia is very mild - just easily confused.    We discussed patient's current illness and what it means in the larger context  of patient's on-going co-morbidities. Discussed concern about ongoing decline in functional status. Lauren Roman expresses understanding to this.   Lauren Roman shares concerns about patient's swallowing - SLP eval was ordered by Dr. Bonner Puna - discussed with Lauren Roman.   Discussed plan for rehab following hospitalization - daughter has concerns but plans to move forward with plan for rehab. Discussed palliative care following at rehab, discussed type of support provided- she is agreeable to this as well.   When I brought up code status, Lauren Roman has a hard time discussing it. She tells me it was discussed on admission and she has difficulty thinking about the topic. She tells me of other family members who elected DNR status and that she did not agree with it - does not specify why. She shares openly that she does not think her mother would survive resuscitation attempts and may cause more suffering - I expressed agreement. I did share with her that it appeared this topic had been discussed with PCP and is documented in her chart - appears patient had elected DNR status - we discuss further what this means. She does share that she know her mother would never want CPR or want to be on a ventilator. We discussed patient could receive full scope treatment up to the point of resuscitation attempts. After all questions addressed, Lauren Roman agrees to DNR status as she wants to honor her mother's wishes.   Did not discuss MOST form as Lauren Roman had difficulty discussing above. Hopeful can be done it outpatient setting - possibly when patient is better able to contribute to conversation  Questions and concerns were addressed. The family was encouraged to call  with questions or concerns.   Primary Decision Maker NEXT OF KIN - daughter Garnet Sierras, patient able to contribute to decision making but needs assistance  SUMMARY OF RECOMMENDATIONS   - code status changed to DNR - palliative care to follow at rehab, daughter agreeable - TOC and  palliative care liaison ([facility]) notified  Code Status/Advance Care Planning:  DNR   Symptom Management:   Per primary/ortho  Prognosis:   Unable to determine  Discharge Planning: [place] for rehab with Palliative care service follow-up      Primary Diagnoses: Present on Admission: . B12 deficiency . Chronic osteoarthritis . UTI (urinary tract infection) . Adult idiopathic generalized osteoporosis   I have reviewed the medical record, interviewed the patient and family, and examined the patient. The following aspects are pertinent.  Past Medical History:  Diagnosis Date  . Arthritis   . Dementia ([place])    mild  . Headache    migraines  . Hepatitis 1952   hep b-no problems since then  . Hyperlipidemia   . Myasthenia gravis ([place])   . Tremor    right hand   Social History   Socioeconomic History  . Marital status: Single    Spouse name: Not on file  . Number of children: Not on file  . Years of education: Not on file  . Highest education level: Not on file  Occupational History  . Not on file  Tobacco Use  . Smoking status: Former Smoker    Quit date: 01/30/1963    Years since quitting: 56.5  . Smokeless tobacco: Never Used  . Tobacco comment: only social smoking and not even a full year  Substance and Sexual Activity  . Alcohol use: Never  . Drug use: Never  . Sexual activity: Not on file  Other Topics Concern  . Not on file  Social History Narrative  . Not on file   Social Determinants of Health   Financial Resource Strain:   . Difficulty of Paying Living Expenses:   Food Insecurity: No Food Insecurity  . Worried About Charity fundraiser in the Last Year: Never true  . Ran Out of Food in the Last Year: Never true  Transportation Needs: No Transportation Needs  . Lack of Transportation (Medical): No  . Lack of Transportation (Non-Medical): No  Physical Activity: Insufficiently Active  . Days of Exercise per Week: 3 days   . Minutes of Exercise per Session: 40 min  Stress: No Stress Concern Present  . Feeling of Stress : Not at all  Social Connections: Unknown  . Frequency of Communication with Friends and Family: Twice a week  . Frequency of Social Gatherings with Friends and Family: Twice a week  . Attends Religious Services: More than 4 times per year  . Active Member of Clubs or Organizations: Yes  . Attends Archivist Meetings: 1 to 4 times per year  . Marital Status: Patient refused   No family history on file. Scheduled Meds: . acidophilus  1 capsule Oral Daily  . cholecalciferol  1,000 Units Oral Daily  . darifenacin  15 mg Oral Daily  . enoxaparin (LOVENOX) injection  40 mg Subcutaneous Q24H  . gabapentin  100 mg Oral q morning - 10a  . gabapentin  200 mg Oral QHS  . magnesium oxide  400 mg Oral Daily  . pantoprazole  40 mg Oral Daily  . polyvinyl alcohol  1 drop Both Eyes Daily  . propranolol  20  mg Oral Daily  . senna-docusate  1 tablet Oral BID  . sodium chloride flush  3 mL Intravenous Q12H  . vitamin B-12  1,000 mcg Oral Daily   Continuous Infusions: . cefTRIAXone (ROCEPHIN)  IV 1 g (08/14/19 2046)   PRN Meds:.bisacodyl, fluticasone, HYDROmorphone (DILAUDID) injection, ondansetron **OR** ondansetron (ZOFRAN) IV, oxyCODONE Allergies  Allergen Reactions  . Exelon [Rivastigmine Tartrate]     Patient unsure of reaction  . Hydrocodone-Acetaminophen Nausea Only    Sometimes make her nausea   . Other Itching and Rash    Anything metal on skin will cause rash and itching   Review of Systems  Constitutional: Positive for activity change.  Gastrointestinal:       Difficulty swallowing  Neurological: Positive for weakness.    Physical Exam Constitutional:      General: She is not in acute distress.    Comments: Sleeping peacefully during my visit  Pulmonary:     Effort: Pulmonary effort is normal. No respiratory distress.  Musculoskeletal:     Right lower leg: No  edema.     Left lower leg: No edema.     Comments: R arm in sling, L wrist splint.   Skin:    General: Skin is warm and dry.  Neurological:     Mental Status: She is alert and oriented to person, place, and time.  Psychiatric:        Mood and Affect: Mood normal.        Behavior: Behavior normal.     Vital Signs: BP (!) 132/42   Roman 81   Temp 97.6 F (36.4 C) (Oral)   Resp 17   SpO2 92%  Pain Scale: 0-10 POSS *See Group Information*: 1-Acceptable,Awake and alert Pain Score: Asleep   SpO2: SpO2: 92 % O2 Device:SpO2: 92 % O2 Flow Rate: .   IO: Intake/output summary:   Intake/Output Summary (Last 24 hours) at 08/15/2019 1143 Last data filed at 08/15/2019 0700 Gross per 24 hour  Intake 240 ml  Output 15 ml  Net 225 ml    LBM: Last BM Date: 08/14/19 Baseline Weight:   Most recent weight:       Palliative Assessment/Data: PPS 40%    Time Total: 50 minutes Greater than 50%  of this time was spent counseling and coordinating care related to the above assessment and plan.  Juel Burrow, DNP, AGNP-C Palliative Medicine Team [phone] Pager: [phone]

## 2019-08-15 NOTE — Plan of Care (Signed)
Continuing with plan of care. 

## 2019-08-15 NOTE — Progress Notes (Signed)
PROGRESS NOTE  Lauren Roman  WER:[id number] DOB: 03-03-1929 DOA: 08/14/2019 PCP: Rusty Aus, MD   Brief Narrative: Lauren Roman is an 86 y.o. female with a history of multiple fractures on chronic opioids, osteoporosis, GERD, HTN, HLD, mild dementia, myasthenia gravis not on therapy, DVT no longer on xarelto, vitamin B12 deficiency, GERD who presented from home where she had a witnessed fall onto the right side. In the ED, work up revealed a fracture of the right humerus and no definite fracture seen on hip XR or subsequent pelvic CT (limited by degree of osteopenia). Urinalysis was collected, antibiotics started empirically though culture has returned negative and antibiotics are stopped. Orthopedic surgery was consulted, recommending conservative management. The patient is usually walker-dependent for mobility and now unable to use this and requires rehabilitation prior to returning home. SNF placement is sought.  Assessment & Plan: Principal Problem:   Fall at home Active Problems:   UTI (urinary tract infection)   Adult idiopathic generalized osteoporosis   B12 deficiency   Chronic osteoarthritis   Goals of care, counseling/discussion   Palliative care by specialist   DNR (do not resuscitate)  Right humerus fracture due to fall at home, severe osteoporosis: XR right shoulder demonstrates impacted, angulated proximal humerus surgical neck fracture. Uses walker for mobility at baseline, now this is complicated by humerus fracture.  - Orthopedics consulted. Conservative management - PT/OT and TOC consulted - Continue tylenol, gabapentin, oxycodone, continue dilaudid prn severe pain  Acute metabolic encephalopathy on chronic mild dementia: Having progressive decline over past few months with immobility and fractures. No evidence of significant infection. Retention may be contributing, and hospital delirium is a significant risk for this patient. - UA and culture without growth  suggestive that previous abx were sufficient. Can stop CTX. - Has not tolerated treatments for dementia per daughter.  Urinary retention:  - Stop vesicare for OAB - I/O with regular bladder scans, if retaining >300cc x3 will place foley.  Constipation:  - Started bowel regimen, will augment today and give suppository if needed.  History of left leg DVT after left hip fracture:  - Was treated with xarelto, no longer on anticoagulation. Will give prophylactic dose lovenox  Essential tremor:  - Continue propranolol  GERD:  - Continue PPI  OAB:  - Hold home med.  Osteoporosis, multiple fractures: Per PCP note, bisphosphonate was stopped due to severe esophagitis. We will not restart this.   Vitamin B12 deficiency:  - Continue supplementation.  DVT prophylaxis: Lovenox Code Status: DNR Family Communication: Daughter by phone Disposition Plan: Patient from home but her current injury/fracture precludes her from returning to that environment safely. PT and OT have recommended SNF at discharge which is being pursued. Pain control is currently requiring IV dilaudid in addition to oral analgesics.  Consultants:   Orthopedics  Procedures:   None  Antimicrobials:  Ceftriaxone 4/1 x1  Subjective: Pain is stable, severe with any movement at all, receiving oral oxycodone and still requiring IV dilaudid for breakthrough pain this afternoon.   Objective: Vitals:   08/14/19 2046 08/14/19 2159 08/15/19 0612 08/15/19 1159  BP: (!) 126/57 (!) 130/59 (!) 132/42 (!) 130/49  Pulse: 86 82 81 80  Resp:  17  18  Temp:  98.6 F (37 C) 97.6 F (36.4 C) 98.6 F (37 C)  TempSrc:  Oral Oral Oral  SpO2:  94% 92% 91%    Intake/Output Summary (Last 24 hours) at 08/15/2019 1453 Last data filed at 08/15/2019 1300  Gross per 24 hour  Intake 240 ml  Output 1000 ml  Net -760 ml   Gen: Frail, tired-appearing female in no distress Pulm: Non-labored breathing room air. Clear to  auscultation bilaterally.  CV: Regular rate and rhythm. No murmur, rub, or gallop. No JVD, no pedal edema. GI: Abdomen soft, non-tender, non-distended, with normoactive bowel sounds. No organomegaly or masses felt. Ext: Warm, right arm in sling with ice on shoulder, internally rotated with exquisite tenderness to palpation. Distal sensation and motor function intact, brisk cap refill, palpable radial pulses. Skin: No rashes, lesions or ulcers Neuro: Alert and incompletely oriented. No focal neurological deficits. Psych: Judgement and insight appear marginal. Mood & affect appropriate.   Data Reviewed: I have personally reviewed following labs and imaging studies  CBC: Recent Labs  Lab 08/14/19 1244 08/15/19 0437  WBC 9.4 8.5  HGB 12.5 11.0*  HCT 37.7 33.4*  MCV 92.2 92.3  PLT 276 271   Basic Metabolic Panel: Recent Labs  Lab 08/14/19 1244 08/15/19 0437  NA 137 140  K 4.2 4.0  CL 104 107  CO2 23 25  GLUCOSE 98 92  BUN 19 17  CREATININE 0.73 0.81  CALCIUM 8.9 8.8*   GFR: CrCl cannot be calculated (Unknown ideal weight.). Liver Function Tests: No results for input(s): AST, ALT, ALKPHOS, BILITOT, PROT, ALBUMIN in the last 168 hours. No results for input(s): LIPASE, AMYLASE in the last 168 hours. No results for input(s): AMMONIA in the last 168 hours. Coagulation Profile: No results for input(s): INR, PROTIME in the last 168 hours. Cardiac Enzymes: No results for input(s): CKTOTAL, CKMB, CKMBINDEX, TROPONINI in the last 168 hours. BNP (last 3 results) No results for input(s): PROBNP in the last 8760 hours. HbA1C: No results for input(s): HGBA1C in the last 72 hours. CBG: No results for input(s): GLUCAP in the last 168 hours. Lipid Profile: No results for input(s): CHOL, HDL, LDLCALC, TRIG, CHOLHDL, LDLDIRECT in the last 72 hours. Thyroid Function Tests: No results for input(s): TSH, T4TOTAL, FREET4, T3FREE, THYROIDAB in the last 72 hours. Anemia Panel: No results  for input(s): VITAMINB12, FOLATE, FERRITIN, TIBC, IRON, RETICCTPCT in the last 72 hours. Urine analysis:    Component Value Date/Time   COLORURINE YELLOW (A) 08/14/2019 1411   APPEARANCEUR CLEAR (A) 08/14/2019 1411   LABSPEC 1.016 08/14/2019 1411   PHURINE 6.0 08/14/2019 1411   GLUCOSEU NEGATIVE 08/14/2019 1411   HGBUR NEGATIVE 08/14/2019 1411   BILIRUBINUR NEGATIVE 08/14/2019 1411   KETONESUR 5 (A) 08/14/2019 1411   PROTEINUR NEGATIVE 08/14/2019 1411   NITRITE NEGATIVE 08/14/2019 1411   LEUKOCYTESUR NEGATIVE 08/14/2019 1411   Recent Results (from the past 240 hour(s))  SARS CORONAVIRUS 2 (TAT 6-24 HRS) Nasopharyngeal Nasopharyngeal Swab     Status: None   Collection Time: 08/14/19 12:44 PM   Specimen: Nasopharyngeal Swab  Result Value Ref Range Status   SARS Coronavirus 2 NEGATIVE NEGATIVE Final    Comment: (NOTE) SARS-CoV-2 target nucleic acids are NOT DETECTED. The SARS-CoV-2 RNA is generally detectable in upper and lower respiratory specimens during the acute phase of infection. Negative results do not preclude SARS-CoV-2 infection, do not rule out co-infections with other pathogens, and should not be used as the sole basis for treatment or other patient management decisions. Negative results must be combined with clinical observations, patient history, and epidemiological information. The expected result is Negative. Fact Sheet for Patients: HairSlick.no Fact Sheet for Healthcare Providers: quierodirigir.com This test is not yet approved or cleared by the  [place] [employer] trader and  has been authorized for detection and/or diagnosis of SARS-CoV-2 by FDA under an TEFL teacher (EUA). This EUA will remain  in effect (meaning this test can be used) for the duration of the COVID-19 declaration under Section 56 4(b)(1) of the Act, 21 U.S.C. section 360bbb-3(b)(1), unless the authorization is terminated or revoked  sooner. Performed at [facility], [address], [place], [place] [address]   Urine Culture     Status: None   Collection Time: 08/14/19  2:11 PM   Specimen: Urine, Clean Catch  Result Value Ref Range Status   Specimen Description   Final    URINE, CLEAN CATCH Performed at [facility], [address], [place], [place] [address]    Special Requests   Final    Normal Performed at [facility], [address], [place], [place] [address]    Culture   Final    NO GROWTH Performed at [facility], [address], [place], [place] [address]    Report Status 08/15/2019 FINAL  Final      Radiology Studies: DG Shoulder Right  Result Date: 08/14/2019 CLINICAL DATA:  Severe right shoulder pain after fall. EXAM: RIGHT SHOULDER - 2+ VIEW COMPARISON:  None. FINDINGS: Acute mildly impacted and angulated fracture of the proximal humerus surgical neck. No dislocation. Osteopenia. The visualized right lung is clear. Prior lower thoracic kyphoplasty. IMPRESSION: 1. Acute mildly impacted and angulated fracture of the proximal humerus surgical neck. Electronically Signed   By: Obie Dredge M.D.   On: 08/14/2019 12:00   CT Head Wo Contrast  Result Date: 08/14/2019 CLINICAL DATA:  Fall.  Head injury with headache EXAM: CT HEAD WITHOUT CONTRAST CT CERVICAL SPINE WITHOUT CONTRAST TECHNIQUE: Multidetector CT imaging of the head and cervical spine was performed following the standard protocol without intravenous contrast. Multiplanar CT image reconstructions of the cervical spine were also generated. COMPARISON:  None. FINDINGS: CT HEAD FINDINGS Brain: Mild atrophy. Chronic ischemic changes in the white matter. 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 Negative for acute infarct, hemorrhage, or mass Vascular: Negative for hyperdense vessel Skull: Negative for skull fracture Sinuses/Orbits: Negative Other: None CT CERVICAL SPINE FINDINGS Alignment: Mild  anterolisthesis C2-3.  Mild retrolisthesis C3-4. Skull base and vertebrae: Negative for fracture Soft tissues and spinal canal: 12 mm right thyroid nodule. No further imaging is required for this lesion. Atherosclerotic calcification of the carotid bifurcation bilaterally. Disc levels: Disc degeneration and spurring throughout the cervical spine. Mild spinal stenosis C3-4, C4-5, C5-6, C6-7 due to spurring. Upper chest: Lung apices clear bilaterally. Other: None IMPRESSION: 1. No acute intracranial abnormality. Atrophy and chronic microvascular ischemia 2. Negative for cervical spine fracture. Moderate to advanced cervical spondylosis. Electronically Signed   By: Marlan Palau M.D.   On: 08/14/2019 11:53   CT Cervical Spine Wo Contrast  Result Date: 08/14/2019 CLINICAL DATA:  Fall.  Head injury with headache EXAM: CT HEAD WITHOUT CONTRAST CT CERVICAL SPINE WITHOUT CONTRAST TECHNIQUE: Multidetector CT imaging of the head and cervical spine was performed following the standard protocol without intravenous contrast. Multiplanar CT image reconstructions of the cervical spine were also generated. COMPARISON:  None. FINDINGS: CT HEAD FINDINGS Brain: Mild atrophy. Chronic ischemic changes in the white matter. 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 Negative for acute infarct, hemorrhage, or mass Vascular: Negative for hyperdense vessel Skull: Negative for skull fracture Sinuses/Orbits: Negative Other: None CT CERVICAL SPINE FINDINGS  Alignment: Mild anterolisthesis C2-3.  Mild retrolisthesis C3-4. Skull base and vertebrae: Negative for fracture Soft tissues and spinal canal: 12 mm right thyroid nodule. No further imaging is required for this lesion. Atherosclerotic calcification of the carotid bifurcation bilaterally. Disc levels: Disc degeneration and spurring throughout the cervical spine. Mild spinal stenosis C3-4, C4-5, C5-6, C6-7 due to spurring. Upper chest: Lung apices clear bilaterally. Other:  None IMPRESSION: 1. No acute intracranial abnormality. Atrophy and chronic microvascular ischemia 2. Negative for cervical spine fracture. Moderate to advanced cervical spondylosis. Electronically Signed   By: Marlan Palau M.D.   On: 08/14/2019 11:53   CT PELVIS WO CONTRAST  Result Date: 08/14/2019 CLINICAL DATA:  Fall, pelvic pain EXAM: CT PELVIS WITHOUT CONTRAST TECHNIQUE: Multidetector CT imaging of the pelvis was performed following the standard protocol without intravenous contrast. COMPARISON:  Same day radiographs FINDINGS: Urinary Tract:  Distended urinary bladder. Bowel: Large burden of stool and stool balls in the rectum. Vascular/Lymphatic: No pathologically enlarged lymph nodes. Aortic atherosclerosis. Reproductive:  No mass or other significant abnormality Other:  None. Musculoskeletal: Severe osteopenia. Intramedullary nail fixation of the left femoral neck. IMPRESSION: 1. Severe osteopenia. No acute displaced fracture of the hips or pelvis. 2. Intramedullary nail fixation of the left femoral neck. No evidence of hardware complication. 3.  Distended urinary bladder.  Correlate for urinary retention. 4.  Large burden of stool and stool balls in the rectum. 5.  Aortic Atherosclerosis (ICD10-I70.0). Electronically Signed   By: Lauralyn Primes M.D.   On: 08/14/2019 15:34   DG Hip Unilat W or Wo Pelvis 2-3 Views Right  Result Date: 08/14/2019 CLINICAL DATA:  Fall EXAM: DG HIP (WITH OR WITHOUT PELVIS) 2-3V RIGHT COMPARISON:  None. FINDINGS: Severe osteopenia. There is no evidence of displaced hip fracture or dislocation. Partially imaged intramedullary nail fixation of the left proximal femur. There is no evidence of arthropathy or other focal bone abnormality. Aortic atherosclerosis. IMPRESSION: Severe osteopenia, which significantly limits sensitivity for fracture. Within this limitation, no evidence of displaced right hip or pelvic fracture. Recommend CT or MRI to more sensitively assess for fracture  if there is high clinical concern. Electronically Signed   By: Lauralyn Primes M.D.   On: 08/14/2019 13:29    Scheduled Meds: . acidophilus  1 capsule Oral Daily  . cholecalciferol  1,000 Units Oral Daily  . darifenacin  15 mg Oral Daily  . enoxaparin (LOVENOX) injection  40 mg Subcutaneous Q24H  . gabapentin  100 mg Oral q morning - 10a  . gabapentin  200 mg Oral QHS  . magnesium oxide  400 mg Oral Daily  . pantoprazole  40 mg Oral Daily  . polyvinyl alcohol  1 drop Both Eyes Daily  . propranolol  20 mg Oral Daily  . senna-docusate  1 tablet Oral BID  . sodium chloride flush  3 mL Intravenous Q12H  . vitamin B-12  1,000 mcg Oral Daily   Continuous Infusions: . cefTRIAXone (ROCEPHIN)  IV 1 g (08/14/19 2046)     LOS: 1 day   Time spent: 25 minutes.  Tyrone Nine, MD [facility] [email or web address] 08/15/2019, 2:53 PM

## 2019-08-15 NOTE — Progress Notes (Signed)
Ch visited with Pt and [name] in response to RR PG. Upon arrival care team was working on Pt to get her comfortable. [name] was directed by Tech to come outside the room to talk to Ch. [name] Lauren Roman was emotional and tearful. Lauren Roman expressed to Ch about mom's (Pt's) fractured shoulder, multiple falls, complications from procedures. Lauren Roman reported that there are only four left ; the pt, Pt's brother, [name], and son-in-law. Daughter was processing impending death and grief. Ch and Lauren Roman walked in to pray with Pt. Pt was grateful for prayer. Pt started asking about her pet chihuahua. Pt did not want to let go of Ch's hand. Daughter helped Pt understand that Ch had to leave. Ch was paged to the ICU at this time. Ch will check back again, and also pass on follow-up to incoming chaplain tomorrow morning.

## 2019-08-16 LAB — CBC
HCT: 32.1 % — ABNORMAL LOW (ref 36.0–46.0)
Hemoglobin: 10.5 g/dL — ABNORMAL LOW (ref 12.0–15.0)
MCH: 30.9 pg (ref 26.0–34.0)
MCHC: 32.7 g/dL (ref 30.0–36.0)
MCV: 94.4 fL (ref 80.0–100.0)
Platelets: 233 10*3/uL (ref 150–400)
RBC: 3.4 MIL/uL — ABNORMAL LOW (ref 3.87–5.11)
RDW: 12.7 % (ref 11.5–15.5)
WBC: 10.3 10*3/uL (ref 4.0–10.5)
nRBC: 0 % (ref 0.0–0.2)

## 2019-08-16 LAB — BASIC METABOLIC PANEL
Anion gap: 10 (ref 5–15)
BUN: 20 mg/dL (ref 8–23)
CO2: 24 mmol/L (ref 22–32)
Calcium: 8.5 mg/dL — ABNORMAL LOW (ref 8.9–10.3)
Chloride: 104 mmol/L (ref 98–111)
Creatinine, Ser: 0.96 mg/dL (ref 0.44–1.00)
GFR calc Af Amer: >60 mL/min (ref >60)
GFR calc non Af Amer: 53 mL/min — ABNORMAL LOW (ref >60)
Glucose, Bld: 104 mg/dL — ABNORMAL HIGH (ref 70–99)
Potassium: 3.9 mmol/L (ref 3.5–5.1)
Sodium: 138 mmol/L (ref 135–145)

## 2019-08-16 MED ORDER — CHLORHEXIDINE GLUCONATE CLOTH 2 % EX PADS
6.0000 | MEDICATED_PAD | Freq: Every day | CUTANEOUS | Status: DC
  Administered 2019-08-16 – 2019-08-18 (×3): 6 via TOPICAL

## 2019-08-16 MED ORDER — DEXTROSE-NACL 5-0.45 % IV SOLN: INTRAVENOUS | Status: DC

## 2019-08-16 MED ORDER — ACETAMINOPHEN 325 MG PO TABS: 650.0000 mg | ORAL_TABLET | Freq: Four times a day (QID) | ORAL | Status: DC | PRN

## 2019-08-16 NOTE — Progress Notes (Signed)
PROGRESS NOTE  Lauren Roman  YTK:[id number] DOB: 05-Sep-1928 DOA: 08/14/2019 PCP: Danella Penton, MD   Brief Narrative: Lauren Roman is an 86 y.o. female with a history of multiple fractures on chronic opioids, osteoporosis, GERD, HTN, HLD, mild dementia, myasthenia gravis not on therapy, DVT no longer on xarelto, vitamin B12 deficiency, GERD who presented from home where she had a witnessed fall onto the right side. In the ED, work up revealed a fracture of the right humerus and no definite fracture seen on hip XR or subsequent pelvic CT (limited by degree of osteopenia). Urinalysis was collected, antibiotics started empirically though culture has returned negative and antibiotics are stopped. Orthopedic surgery was consulted, recommending conservative management. The patient is usually walker-dependent for mobility and now unable to use this and requires rehabilitation prior to returning home. SNF placement is sought.  Assessment & Plan: Principal Problem:   Fall at home Active Problems:   UTI (urinary tract infection)   Adult idiopathic generalized osteoporosis   B12 deficiency   Chronic osteoarthritis   Goals of care, counseling/discussion   Palliative care by specialist   DNR (do not resuscitate)  Right humerus fracture due to fall at home, severe osteoporosis: XR right shoulder demonstrates impacted, angulated proximal humerus surgical neck fracture. Uses walker for mobility at baseline, now this is complicated by humerus fracture.  - Orthopedics consulted. Conservative management - PT/OT and TOC consulted - Continue tylenol, gabapentin, oxycodone  Acute metabolic encephalopathy on chronic mild dementia: Having progressive decline over past few months with immobility and fractures. No evidence of significant infection. Retention may be contributing, and hospital delirium is a significant risk for this patient. - UA and culture without growth suggestive that previous abx were  sufficient. Can stop CTX. - Has not tolerated treatments for dementia per daughter.  Soft blood pressure: Was present on arrival without true hypotension, pulse pressure is wide consistent with atherosclerosis, though no tachycardia to suggest sepsis, active bleeding. Note fever was documented this morning though this was with a heating pad on the neck at the time, temperature normal on recheck.  - Due to poor per oral intake, will start maintenance IV fluids - Avoid IV narcotics if possible - Hold propranolol - Follow up blood cultures drawn 4/3.  Atypical chest pain: CXR, troponin normal, not consistent with acute cardiothoracic condition. This pain has resolved.  - Continue to monitor. If pain recurs, check ECG.  Urinary retention:  - Stop vesicare for OAB - Foley placed, will attempt voiding trial prior to discharge.  Constipation:  - Improved with enema. LBM 4/3.  History of left leg DVT after left hip fracture:  - Was treated with xarelto, no longer on anticoagulation. Will continue prophylactic dose lovenox, need to watch hgb (down 2g/dl without active bleeding noted).   Essential tremor:  - Hold propranolol for now with soft BPs  GERD:  - Continue PPI  OAB:  - Hold home med.  Osteoporosis, multiple fractures: Per PCP note, bisphosphonate was stopped due to severe esophagitis. We will not restart this.   Vitamin B12 deficiency:  - Continue supplementation.  DVT prophylaxis: Lovenox Code Status: DNR Family Communication: Daughter at bedside Disposition Plan: Patient from home but her current injury/fracture precludes her from returning to that environment safely. PT and OT have recommended SNF at discharge which is being pursued. Pain control is currently requiring IV dilaudid in addition to oral analgesics.  Consultants:   Orthopedics  Procedures:   Foley catheter 4/2 >>  Antimicrobials:  Ceftriaxone 4/1 x1  Subjective: Events this morning noted,  chest pain has resolved, patient denies shortness of breath. Pain in shoulder is controlled at rest. Had some pain in her heels and feet earlier which improved with floating heels and with her daughter rubbing CBD oil on them. Feels incredibly fatigued.  Objective: Vitals:   08/16/19 0512 08/16/19 0646 08/16/19 0700 08/16/19 1403  BP: (!) 132/52  (!) 135/55 (!) 124/48  Pulse: 88  81 77  Resp: 20   16  Temp: (!) 102.7 F (39.3 C) 99.1 F (37.3 C)  98.1 F (36.7 C)  TempSrc: Oral Oral  Oral  SpO2: 96%   97%   Gen: Frail, tired-appearing elderly female in no distress Pulm: Nonlabored breathing. Clear. CV: Regular rate and rhythm. No murmur, rub, or gallop. No JVD, no dependent edema. GI: Abdomen soft, non-tender, non-distended, with normoactive bowel sounds.  Ext: Warm. Right shoulder tender, unchanged with ice pack in place. Skin: No new rashes, lesions or ulcers on visualized skin. No swelling.  Neuro: Drowsy but rousable and spontaneously interactive, though sometimes just laying with eyes closed listening to conversation with her daughter. Moves all extremities, no change in speech. Psych: Judgement and insight appear fair. Mood euthymic & affect congruent. Behavior is appropriate.    Data Reviewed: I have personally reviewed following labs and imaging studies  CBC: Recent Labs  Lab 08/14/19 1244 08/15/19 0437 08/16/19 0839  WBC 9.4 8.5 10.3  HGB 12.5 11.0* 10.5*  HCT 37.7 33.4* 32.1*  MCV 92.2 92.3 94.4  PLT 276 271 233   Basic Metabolic Panel: Recent Labs  Lab 08/14/19 1244 08/15/19 0437 08/16/19 0839  NA 137 140 138  K 4.2 4.0 3.9  CL 104 107 104  CO2 23 25 24   GLUCOSE 98 92 104*  BUN 19 17 20   CREATININE 0.73 0.81 0.96  CALCIUM 8.9 8.8* 8.5*   GFR: CrCl cannot be calculated (Unknown ideal weight.). Liver Function Tests: No results for input(s): AST, ALT, ALKPHOS, BILITOT, PROT, ALBUMIN in the last 168 hours. No results for input(s): LIPASE, AMYLASE in the  last 168 hours. No results for input(s): AMMONIA in the last 168 hours. Coagulation Profile: No results for input(s): INR, PROTIME in the last 168 hours. Cardiac Enzymes: No results for input(s): CKTOTAL, CKMB, CKMBINDEX, TROPONINI in the last 168 hours. BNP (last 3 results) No results for input(s): PROBNP in the last 8760 hours. HbA1C: No results for input(s): HGBA1C in the last 72 hours. CBG: No results for input(s): GLUCAP in the last 168 hours. Lipid Profile: No results for input(s): CHOL, HDL, LDLCALC, TRIG, CHOLHDL, LDLDIRECT in the last 72 hours. Thyroid Function Tests: No results for input(s): TSH, T4TOTAL, FREET4, T3FREE, THYROIDAB in the last 72 hours. Anemia Panel: No results for input(s): VITAMINB12, FOLATE, FERRITIN, TIBC, IRON, RETICCTPCT in the last 72 hours. Urine analysis:    Component Value Date/Time   COLORURINE YELLOW (A) 08/14/2019 1411   APPEARANCEUR CLEAR (A) 08/14/2019 1411   LABSPEC 1.016 08/14/2019 1411   PHURINE 6.0 08/14/2019 1411   GLUCOSEU NEGATIVE 08/14/2019 1411   HGBUR NEGATIVE 08/14/2019 1411   BILIRUBINUR NEGATIVE 08/14/2019 1411   KETONESUR 5 (A) 08/14/2019 1411   PROTEINUR NEGATIVE 08/14/2019 1411   NITRITE NEGATIVE 08/14/2019 1411   LEUKOCYTESUR NEGATIVE 08/14/2019 1411   Recent Results (from the past 240 hour(s))  SARS CORONAVIRUS 2 (TAT 6-24 HRS) Nasopharyngeal Nasopharyngeal Swab     Status: None   Collection Time: 08/14/19 12:44 PM  Specimen: Nasopharyngeal Swab  Result Value Ref Range Status   SARS Coronavirus 2 NEGATIVE NEGATIVE Final    Comment: (NOTE) SARS-CoV-2 target nucleic acids are NOT DETECTED. The SARS-CoV-2 RNA is generally detectable in upper and lower respiratory specimens during the acute phase of infection. Negative results do not preclude SARS-CoV-2 infection, do not rule out co-infections with other pathogens, and should not be used as the sole basis for treatment or other patient management decisions. Negative  results must be combined with clinical observations, patient history, and epidemiological information. The expected result is Negative. Fact Sheet for Patients: HairSlick.no Fact Sheet for Healthcare Providers: quierodirigir.com This test is not yet approved or cleared by the [place] FDA and  has been authorized for detection and/or diagnosis of SARS-CoV-2 by FDA under an Emergency Use Authorization (EUA). This EUA will remain  in effect (meaning this test can be used) for the duration of the COVID-19 declaration under Section 56 4(b)(1) of the Act, 21 U.S.C. section 360bbb-3(b)(1), unless the authorization is terminated or revoked sooner. Performed at [facility], [address], [place], [place] [address]   Urine Culture     Status: None   Collection Time: 08/14/19  2:11 PM   Specimen: Urine, Clean Catch  Result Value Ref Range Status   Specimen Description   Final    URINE, CLEAN CATCH Performed at [facility], [address], [place], [place] [address]    Special Requests   Final    Normal Performed at [facility], [address] [place], [place] [address]    Culture   Final    NO GROWTH Performed at [facility], [address], [place], [place] [address]    Report Status 08/15/2019 FINAL  Final      Radiology Studies: DG Chest 1 View  Result Date: 08/15/2019 CLINICAL DATA:  Chest pain EXAM: CHEST  1 VIEW COMPARISON:  12/27/2018 FINDINGS: Cardiomegaly. Both lungs are clear. Interval kyphoplasty of a lower thoracic vertebral body. IMPRESSION: Cardiomegaly without acute abnormality of the lungs in AP portable projection. Electronically Signed   By: Lauralyn Primes M.D.   On: 08/15/2019 20:21   CT PELVIS WO CONTRAST  Result Date: 08/14/2019 CLINICAL DATA:  Fall, pelvic pain EXAM: CT PELVIS WITHOUT CONTRAST TECHNIQUE: Multidetector CT imaging of the pelvis was performed following the  standard protocol without intravenous contrast. COMPARISON:  Same day radiographs FINDINGS: Urinary Tract:  Distended urinary bladder. Bowel: Large burden of stool and stool balls in the rectum. Vascular/Lymphatic: No pathologically enlarged lymph nodes. Aortic atherosclerosis. Reproductive:  No mass or other significant abnormality Other:  None. Musculoskeletal: Severe osteopenia. Intramedullary nail fixation of the left femoral neck. IMPRESSION: 1. Severe osteopenia. No acute displaced fracture of the hips or pelvis. 2. Intramedullary nail fixation of the left femoral neck. No evidence of hardware complication. 3.  Distended urinary bladder.  Correlate for urinary retention. 4.  Large burden of stool and stool balls in the rectum. 5.  Aortic Atherosclerosis (ICD10-I70.0). Electronically Signed   By: Lauralyn Primes M.D.   On: 08/14/2019 15:34    Scheduled Meds: . acidophilus  1 capsule Oral Daily  . Chlorhexidine Gluconate Cloth  6 each Topical Daily  . cholecalciferol  1,000 Units Oral Daily  . enoxaparin (LOVENOX) injection  40 mg Subcutaneous Q24H  . gabapentin  100 mg Oral q morning - 10a  . gabapentin  200 mg Oral QHS  . magnesium oxide  400 mg Oral Daily  .  pantoprazole  40 mg Oral Daily  . polyethylene glycol  17 g Oral Daily  . polyvinyl alcohol  1 drop Both Eyes Daily  . senna-docusate  2 tablet Oral BID  . sodium chloride flush  3 mL Intravenous Q12H  . vitamin B-12  1,000 mcg Oral Daily   Continuous Infusions:    LOS: 2 days   Time spent: 25 minutes.  Patrecia Pour, MD [facility] [email or web address] 08/16/2019, 2:31 PM

## 2019-08-16 NOTE — TOC Initial Note (Signed)
Transition of Care [facility]) - Initial/Assessment Note    Patient Details  Name: Lauren Roman MRN: [id number] Date of Birth: 1928/11/02  [facility]) CM/SW Contact:    Archie Endo, LCSW Phone Number: 08/16/2019, 11:10 AM  Clinical Narrative:                 CSW spoke with patient's daughter Juliann Pulse at [phone] to complete assessment. Juliann Pulse states the preference for SNF placement is [facility] and states she does not want her mother to go to [facility]. CSW faxed patient's information out to local facilities.  Expected Discharge Plan: Skilled Nursing Facility Barriers to Discharge: Continued Medical Work up   Patient Goals and CMS Choice     Choice offered to / list presented to : Adult Children  Expected Discharge Plan and Services Expected Discharge Plan: [place] arrangements for the past 2 months: Single Family Home    Prior Living Arrangements/Services Living arrangements for the past 2 months: Single Family Home Lives with:: Self Patient language and need for interpreter reviewed:: No Do you feel safe going back to the place where you live?: Yes      Need for Family Participation in Patient Care: Yes (Comment) Care giver support system in place?: Yes (comment)   Criminal Activity/Legal Involvement Pertinent to Current Situation/Hospitalization: No - Comment as needed  Activities of Daily Living Home Assistive Devices/Equipment: Walker (specify type) ADL Screening (condition at time of admission) Patient's cognitive ability adequate to safely complete daily activities?: No Is the patient deaf or have difficulty hearing?: Yes Does the patient have difficulty seeing, even when wearing glasses/contacts?: No Does the patient have difficulty concentrating, remembering, or making decisions?: Yes Patient able to express need for assistance with ADLs?: No Does the patient have difficulty dressing or bathing?:  Yes Independently performs ADLs?: No Communication: Independent Dressing (OT): Needs assistance Is this a change from baseline?: Change from baseline, expected to last >3 days(broken arm) Grooming: Needs assistance Is this a change from baseline?: Change from baseline, expected to last >3 days Feeding: Needs assistance Is this a change from baseline?: Change from baseline, expected to last >3 days Bathing: Needs assistance Is this a change from baseline?: Change from baseline, expected to last >3 days Toileting: Needs assistance Is this a change from baseline?: Change from baseline, expected to last >3days In/Out Bed: Needs assistance Is this a change from baseline?: Change from baseline, expected to last >3 days Walks in Home: Independent Does the patient have difficulty walking or climbing stairs?: Yes Weakness of Legs: Both Weakness of Arms/Hands: Right  Permission Sought/Granted                  Emotional Assessment     Affect (typically observed): Accepting Orientation: : Oriented to Self, Oriented to Situation, Fluctuating Orientation (Suspected and/or reported Sundowners) Alcohol / Substance Use: Not Applicable Psych Involvement: No (comment)  Admission diagnosis:  Delirium [R41.0] Fall at home [W19.XXXA, Y92.009] Shoulder fracture, right, closed, initial encounter [S42.91XA] Patient Active Problem List   Diagnosis Date Noted  . Goals of care, counseling/discussion   . Palliative care by specialist   . DNR (do not resuscitate)   . Fall at home 08/14/2019  . Pressure injury of skin 12/28/2018  . UTI (urinary tract infection) 12/27/2018  . Acute deep vein thrombosis (DVT) of femoral vein of left lower extremity ([place]) 12/10/2018  . HAP (hospital-acquired pneumonia) 11/01/2018  . Closed left hip  fracture (HCC) 09/19/2018  . DDD (degenerative disc disease), lumbar 06/20/2018  . Lumbar radiculopathy 01/30/2018  . Adult idiopathic generalized osteoporosis  06/15/2016  . Chronic osteoarthritis 06/15/2016  . B12 deficiency 07/21/2014   PCP:  Danella Penton, MD Pharmacy:   [facility] [place] [place] - [address] [place] [address] Phone: [phone] Fax: [phone]     Social Determinants of Health (SDOH) Interventions    Readmission Risk Interventions No flowsheet data found.

## 2019-08-16 NOTE — NC FL2 (Signed)
[place] MEDICAID FL2 LEVEL OF CARE SCREENING TOOL     IDENTIFICATION  Patient Name: Lauren Roman Birthdate: 08/06/1928 Sex: female Admission Date (Current Location): 08/14/2019  [place] and [place] Number:  Chiropodist and Address:  [facility], [address] [place], [place] [address]      Provider Number: [id number]  Attending Physician Name and Address:  Tyrone Nine, MD  Relative Name and Phone Number:  Olegario Messier [phone]    Current Level of Care: Hospital Recommended Level of Care: Nursing Facility Prior Approval Number:    Date Approved/Denied:   PASRR Number: [id number]  Discharge Plan: SNF    Current Diagnoses: Patient Active Problem List   Diagnosis Date Noted  . Goals of care, counseling/discussion   . Palliative care by specialist   . DNR (do not resuscitate)   . Fall at home 08/14/2019  . Pressure injury of skin 12/28/2018  . UTI (urinary tract infection) 12/27/2018  . Acute deep vein thrombosis (DVT) of femoral vein of left lower extremity (HCC) 12/10/2018  . HAP (hospital-acquired pneumonia) 11/01/2018  . Closed left hip fracture (HCC) 09/19/2018  . DDD (degenerative disc disease), lumbar 06/20/2018  . Lumbar radiculopathy 01/30/2018  . Adult idiopathic generalized osteoporosis 06/15/2016  . Chronic osteoarthritis 06/15/2016  . B12 deficiency 07/21/2014    Orientation RESPIRATION BLADDER Height & Weight     Self, Time, Situation  Normal Incontinent Weight:   Height:     BEHAVIORAL SYMPTOMS/MOOD NEUROLOGICAL BOWEL NUTRITION STATUS      Incontinent    AMBULATORY STATUS COMMUNICATION OF NEEDS Skin   Extensive Assist Verbally Skin abrasions(Left hand, right shoulder)                       Personal Care Assistance Level of Assistance  Dressing, Bathing, Feeding Bathing Assistance: Maximum assistance Feeding assistance: Maximum assistance Dressing Assistance: Maximum assistance     Functional  Limitations Info  Sight, Hearing, Speech Sight Info: Adequate Hearing Info: Impaired Speech Info: Adequate    SPECIAL CARE FACTORS FREQUENCY  PT (By licensed PT), OT (By licensed OT)     PT Frequency: 5x weekly OT Frequency: 5x weekly            Contractures Contractures Info: Not present    Additional Factors Info  Code Status Code Status Info: DNR             Current Medications (08/16/2019):  This is the current hospital active medication list Current Facility-Administered Medications  Medication Dose Route Frequency Provider Last Rate Last Admin  . acetaminophen (TYLENOL) tablet 650 mg  650 mg Oral Q6H PRN Jimmye Norman, NP      . acidophilus (RISAQUAD) capsule 1 capsule  1 capsule Oral Daily Tyrone Nine, MD   1 capsule at 08/16/19 0801  . bisacodyl (DULCOLAX) suppository 10 mg  10 mg Rectal Daily PRN Tyrone Nine, MD      . Chlorhexidine Gluconate Cloth 2 % PADS 6 each  6 each Topical Daily Tyrone Nine, MD      . cholecalciferol (VITAMIN D3) tablet 1,000 Units  1,000 Units Oral Daily Tyrone Nine, MD   1,000 Units at 08/16/19 0801  . enoxaparin (LOVENOX) injection 40 mg  40 mg Subcutaneous Q24H Tyrone Nine, MD   40 mg at 08/15/19 2257  . fluticasone (FLONASE) 50 MCG/ACT nasal spray 1 spray  1 spray Each Nare Daily PRN Tyrone Nine, MD      .  gabapentin (NEURONTIN) capsule 100 mg  100 mg Oral q morning - 10a Patrecia Pour, MD   100 mg at 08/16/19 0802  . gabapentin (NEURONTIN) capsule 200 mg  200 mg Oral QHS Patrecia Pour, MD   200 mg at 08/15/19 2257  . HYDROmorphone (DILAUDID) injection 0.5 mg  0.5 mg Intravenous Q3H PRN Patrecia Pour, MD   0.5 mg at 08/15/19 1215  . magnesium oxide (MAG-OX) tablet 400 mg  400 mg Oral Daily Patrecia Pour, MD   400 mg at 08/16/19 0802  . morphine 2 MG/ML injection 2 mg  2 mg Intravenous Q4H PRN Lang Snow, NP   2 mg at 08/15/19 2003  . nitroGLYCERIN (NITROSTAT) SL tablet 0.4 mg  0.4 mg Sublingual Q5 min  PRN Lang Snow, NP   0.4 mg at 08/15/19 2004  . ondansetron (ZOFRAN) tablet 4 mg  4 mg Oral Q6H PRN Patrecia Pour, MD       Or  . ondansetron [facility]) injection 4 mg  4 mg Intravenous Q6H PRN Patrecia Pour, MD      . oxyCODONE (Oxy IR/ROXICODONE) immediate release tablet 5-10 mg  5-10 mg Oral Q4H PRN Patrecia Pour, MD   10 mg at 08/15/19 0981  . pantoprazole (PROTONIX) EC tablet 40 mg  40 mg Oral Daily Patrecia Pour, MD   40 mg at 08/16/19 0802  . polyethylene glycol (MIRALAX / GLYCOLAX) packet 17 g  17 g Oral Daily Patrecia Pour, MD   17 g at 08/16/19 0800  . polyvinyl alcohol (LIQUIFILM TEARS) 1.4 % ophthalmic solution 1 drop  1 drop Both Eyes Daily Patrecia Pour, MD   1 drop at 08/16/19 0809  . propranolol (INDERAL) tablet 20 mg  20 mg Oral Daily Patrecia Pour, MD   20 mg at 08/14/19 2046  . senna-docusate (Senokot-S) tablet 2 tablet  2 tablet Oral BID Patrecia Pour, MD   2 tablet at 08/16/19 0800  . sodium chloride flush (NS) 0.9 % injection 3 mL  3 mL Intravenous Q12H Patrecia Pour, MD   3 mL at 08/16/19 0810  . vitamin B-12 (CYANOCOBALAMIN) tablet 1,000 mcg  1,000 mcg Oral Daily Patrecia Pour, MD   1,000 mcg at 08/16/19 0802     Discharge Medications: Please see discharge summary for a list of discharge medications.  Relevant Imaging Results:  Relevant Lab Results:   Additional Information SSN: [id number]  Archie Endo, LCSW

## 2019-08-16 NOTE — Plan of Care (Signed)
Continuing with plan of care. 

## 2019-08-16 NOTE — Progress Notes (Signed)
PT Cancellation Note  Patient Details Name: Lauren Roman MRN: [id number] DOB: 1928-12-16   Cancelled Treatment:    Reason Eval/Treat Not Completed: Fatigue/lethargy limiting ability to participate   Pt in bed.  Awakes briefly to state "I'm exhausted" then falls back asleep.  Session deferred at this time.   Danielle Dess 08/16/2019, 11:28 AM

## 2019-08-16 NOTE — Progress Notes (Addendum)
    BRIEF OVERNIGHT PROGRESS REPORT   SUBJECTIVE: Rapid response initiated for complaint of chest pain without associated symptoms of shortness of breath, nausea vomiting, diaphoresis or radiation.  Patient describes pain as squeezing and predominantly located in her mid chest rating 7 out of a 10 on a pain intensity rating scale.  Pain is worse with deep inspiration.  OBJECTIVE: On arrival to the ED, he was afebrile with blood pressure 130/59 mm Hg and pulse rate 78 beats/min. There were no focal neurological deficits; she was alert but disoriented x3.  ASSESSMENT/PLAN 1. Atypical chest pain - initial concerns for ACS however EKG unremarkable with normal troponin. Patient with recent fall could be musculoskeletal related? - Chest xray unremarkable - Trend troponin - Supplemental oxygen keep sats>92% - NTG + morphine PRN chest pain  - Prophylactic Lovenox     Webb Silversmith, DNP, CCRN, FNP-C [facility] Nurse Practitioner

## 2019-08-16 NOTE — Progress Notes (Signed)
Writer notified during shift change that pt was having chest pain. Writer immediately went to pt's room and pt stated she was having mid chest pain that she rated a 7/10, squeezing, and worsening with deep breaths. A second RN paged the NP while the unit secretary called a rapid response. Pt's vitals taken and charted. Pt's oxygen at 88% on RA, 2L [place] applied and O2 went up to 96%. Rapid response team arrived and assessed. Orders placed by team and NP. SL Nitro given along with Morphine. Pt stated she had some relief from pain, however could not quantify it. [name] at bedside for duration of event. Pt resting comfortably by end of event. Will continue to closely monitor patient.

## 2019-08-17 ENCOUNTER — Inpatient Hospital Stay: Payer: Medicare Other

## 2019-08-17 LAB — BASIC METABOLIC PANEL
Anion gap: 7 (ref 5–15)
BUN: 16 mg/dL (ref 8–23)
CO2: 25 mmol/L (ref 22–32)
Calcium: 8.1 mg/dL — ABNORMAL LOW (ref 8.9–10.3)
Chloride: 101 mmol/L (ref 98–111)
Creatinine, Ser: 0.7 mg/dL (ref 0.44–1.00)
GFR calc Af Amer: >60 mL/min (ref >60)
GFR calc non Af Amer: >60 mL/min (ref >60)
Glucose, Bld: 107 mg/dL — ABNORMAL HIGH (ref 70–99)
Potassium: 4 mmol/L (ref 3.5–5.1)
Sodium: 133 mmol/L — ABNORMAL LOW (ref 135–145)

## 2019-08-17 LAB — CBC
HCT: 29.5 % — ABNORMAL LOW (ref 36.0–46.0)
Hemoglobin: 9.8 g/dL — ABNORMAL LOW (ref 12.0–15.0)
MCH: 30.3 pg (ref 26.0–34.0)
MCHC: 33.2 g/dL (ref 30.0–36.0)
MCV: 91.3 fL (ref 80.0–100.0)
Platelets: 217 10*3/uL (ref 150–400)
RBC: 3.23 MIL/uL — ABNORMAL LOW (ref 3.87–5.11)
RDW: 12.5 % (ref 11.5–15.5)
WBC: 8 10*3/uL (ref 4.0–10.5)
nRBC: 0 % (ref 0.0–0.2)

## 2019-08-17 LAB — FIBRIN DERIVATIVES D-DIMER (ARMC ONLY): Fibrin derivatives D-dimer (ARMC): 3007.57 ng/mL (FEU) — ABNORMAL HIGH (ref 0.00–499.00)

## 2019-08-17 MED ORDER — ACETAMINOPHEN 325 MG PO TABS
650.0000 mg | ORAL_TABLET | Freq: Four times a day (QID) | ORAL | Status: DC
  Administered 2019-08-17 – 2019-08-19 (×9): 650 mg via ORAL
  Filled 2019-08-17 (×10): qty 2

## 2019-08-17 MED ORDER — ENSURE ENLIVE PO LIQD
237.0000 mL | Freq: Two times a day (BID) | ORAL | Status: DC
  Administered 2019-08-17 – 2019-08-19 (×6): 237 mL via ORAL

## 2019-08-17 NOTE — Progress Notes (Signed)
Physical Therapy Treatment Patient Details Name: Lauren Roman MRN: [id number] DOB: 11/14/1928 Today's Date: 08/17/2019    History of Present Illness Pt is a 86 y.o. female that presented to ED after a fall, xray showed Acute mildly impacted and angulated fracture of the proximal humerus surgical neck. Per ortho plan is immobilization, and no evidence of pelvic fx. PMH of arthritis, dementia, migraine, hepatitis, hyperlipidemia, MG, tremor (r hand), previous falls, L hip fx, kyphoplasty. Pt also noted to have L wrist fracture, currently in wrist brace.    PT Comments    Pt in bed.  Awakens to voice.  Fever noted this am and cheeks look flushed.  Session limited due to malaise and pain in R shoulder.  RN called but is unable to have more pain medication at this time.  She does tolerate BLE ex's with AA/PROM assist.  She is given frequent rest breaks and encouragement.  Daughter in at end of session with multiple questions and concerns.  Answered questions as appropriate and relayed her concerns to MD and RN.     Follow Up Recommendations  SNF     Equipment Recommendations       Recommendations for Other Services       Precautions / Restrictions Precautions Precautions: Fall;Shoulder Shoulder Interventions: At all times;Shoulder sling/immobilizer Precaution Booklet Issued: No Required Braces or Orthoses: Other Brace Other Brace: L Velcro wrist brace Restrictions Weight Bearing Restrictions: Yes RUE Weight Bearing: Non weight bearing LUE Weight Bearing: (Pt able to put weight while wearing velcro brace) Other Position/Activity Restrictions: per ortho consult and follow up notes in 05/2019, "wear L wrist brace at all times except for showering"'; brace will "allow her to begin grasping her wheelchair to assist with ambulation."    Mobility  Bed Mobility                  Transfers                    Ambulation/Gait                 Stairs              Wheelchair Mobility    Modified Rankin (Stroke Patients Only)       Balance                                            Cognition Arousal/Alertness: Lethargic Behavior During Therapy: WFL for tasks assessed/performed Overall Cognitive Status: History of cognitive impairments - at baseline                                 General Comments: more alert today but remains lethargic      Exercises Other Exercises Other Exercises: BLE AA/PROM 2 x 10 for ankle pumps, heel slides, ab/add and SLR    General Comments        Pertinent Vitals/Pain Pain Assessment: Faces Faces Pain Scale: Hurts whole lot Pain Location: R shoulder with any movement and at rest. - RN called and pain medicince not due yet. Pain Descriptors / Indicators: Grimacing;Guarding;Moaning Pain Intervention(s): Limited activity within patient's tolerance;Monitored during session;Repositioned    Home Living                      Prior  Function            PT Goals (current goals can now be found in the care plan section) Progress towards PT goals: Not progressing toward goals - comment    Frequency    7X/week      PT Plan Current plan remains appropriate    Co-evaluation              AM-PAC PT "6 Clicks" Mobility   Outcome Measure  Help needed turning from your back to your side while in a flat bed without using bedrails?: Total Help needed moving from lying on your back to sitting on the side of a flat bed without using bedrails?: Total Help needed moving to and from a bed to a chair (including a wheelchair)?: Total Help needed standing up from a chair using your arms (e.g., wheelchair or bedside chair)?: Total Help needed to walk in hospital room?: Total Help needed climbing 3-5 steps with a railing? : Total 6 Click Score: 6    End of Session Equipment Utilized During Treatment: Oxygen Activity Tolerance: Patient limited by fatigue;Patient  limited by pain Patient left: in bed;with call bell/phone within reach;with bed alarm set;with family/visitor present Nurse Communication: Mobility status Pain - Right/Left: Right Pain - part of body: Shoulder     Time: 0973-5329 PT Time Calculation (min) (ACUTE ONLY): 26 min  Charges:  $Therapeutic Exercise: 8-22 mins $Therapeutic Activity: 8-22 mins                    Chesley Noon, PTA 08/17/19, 9:59 AM

## 2019-08-17 NOTE — Progress Notes (Signed)
PROGRESS NOTE  [name]  GGY:[id number] DOB: 23-Aug-1928 DOA: 08/14/2019 PCP: Danella Penton, MD   Brief Narrative: Lauren Roman is an 86 y.o. female with a history of multiple fractures on chronic opioids, osteoporosis, GERD, HTN, HLD, mild dementia, myasthenia gravis not on therapy, DVT no longer on xarelto, vitamin B12 deficiency, GERD who presented from home where she had a witnessed fall onto the right side. In the ED, work up revealed a fracture of the right humerus and no definite fracture seen on hip XR or subsequent pelvic CT (limited by degree of osteopenia). Urinalysis was collected, antibiotics started empirically though culture has returned negative and antibiotics are stopped. Orthopedic surgery was consulted, recommending conservative management. The patient is usually walker-dependent for mobility and now unable to use this and requires rehabilitation prior to returning home. SNF placement is sought.  Assessment & Plan: Principal Problem:   Fall at home Active Problems:   UTI (urinary tract infection)   Adult idiopathic generalized osteoporosis   B12 deficiency   Chronic osteoarthritis   Goals of care, counseling/discussion   Palliative care by specialist   DNR (do not resuscitate)  Right humerus fracture due to fall at home, severe osteoporosis: XR right shoulder demonstrates impacted, angulated proximal humerus surgical neck fracture. Uses walker for mobility at baseline, now this is complicated by humerus fracture.  - Orthopedics consulted. Conservative management - PT/OT and TOC consulted - Continue tylenol, will have this scheduled to minimize narcotics. Continue gabapentin, oxycodone  Hypoxia: Possibly due to sedation/hypoventilation at night. No documented hypoxia. Lungs clear, no active changes on CXR on my personal review this morning. - Wean O2.   Fever: UA negative as above, blood cultures have been negative, CXR without infiltrate.  - R/o DVT -  Monitor cultures (blood 4/3) - ?DC opioids if continues - Tylenol as above  Acute metabolic encephalopathy on chronic mild dementia: Having progressive decline over past few months with immobility and fractures. No evidence of significant infection. Retention may be contributing, and hospital delirium is a significant risk for this patient. - Stabilizing. Continue delirium precautions. Reviewed need to minimize sedating medications w/pt and daughter. - Has not tolerated treatments for dementia per daughter.  Soft blood pressure: Was present on arrival without true hypotension, pulse pressure is wide consistent with atherosclerosis, though no tachycardia to suggest sepsis, active bleeding. Note fever was documented this morning though this was with a heating pad on the neck at the time, temperature normal on recheck.  - Continue maintenance IV fluids - Avoid IV narcotics if possible - Holding propranolol  Atypical chest pain: CXR, troponin normal, not consistent with acute cardiothoracic condition. This pain has resolved.  - Continue to monitor. Remains resolved.  Urinary retention:  - Stopped vesicare for OAB - Foley placed, will attempt voiding trial prior to discharge.  Constipation:  - Improved with enema. LBM 4/3.  History of left leg DVT after left hip fracture:  - Was treated with xarelto, no longer on anticoagulation. Will continue prophylactic dose lovenox.  - Pt w/fevers otherwise unexplained, daughter feels left leg is swollen. D-dimer checked and elevated, will eval w/ venous U/S.  Essential tremor:  - Hold propranolol for now with soft BPs  GERD:  - Continue PPI  OAB:  - Hold home med.  Osteoporosis, multiple fractures: Per PCP note, bisphosphonate was stopped due to severe esophagitis. We will not restart this.  - Optimize nutritional status with ensure/protein supplementation.  Vitamin B12 deficiency:  - Continue  supplementation.  DVT prophylaxis: Lovenox  ppx Code Status: DNR Family Communication: Daughter at bedside Disposition Plan: Patient from home but her current injury/fracture precludes her from returning to that environment safely. PT and OT have recommended SNF at discharge which is being pursued. Pain control is currently requiring IV dilaudid in addition to oral analgesics.  Consultants:   Orthopedics  Procedures:   Foley catheter 4/2 >>  Antimicrobials:  Ceftriaxone 4/1 x1  Subjective: Feels tired, complains of pain at rest though is very drowsy and not in any distress. Had a fever this AM but does not feel ill. No cough or chest pain or dyspnea. No abd pain, N/V. Interested in protein supplement. No leg pain or swelling.  Objective: Vitals:   08/16/19 1403 08/16/19 1927 08/17/19 0434 08/17/19 0638  BP: (!) 124/48 (!) 136/52 (!) 130/57   Pulse: 77 82 71   Resp: 16 20 16    Temp: 98.1 F (36.7 C) 98.6 F (37 C) (!) 101.3 F (38.5 C) 98.9 F (37.2 C)  TempSrc: Oral Axillary Oral Oral  SpO2: 97% 100% 97%    Gen: Tired-appearing elderly female in no distress Pulm: Nonlabored breathing 2L O2 97%, normal rate, clear. CV: Regular rate and rhythm. No murmur, rub, or gallop. No JVD, no dependent edema. GI: Abdomen soft, non-tender, non-distended, with normoactive bowel sounds.  Ext: Warm, right arm in sling, less TTP along lateral upper arm. No skin tenting. Negative homan's bilaterally. Skin: No new rashes, lesions or ulcers on visualized skin. Feels diffusely warm, has many blankets on Neuro: Alert and oriented, more interactive. No focal neurological deficits. Psych: Judgement and insight appear fair. Mood euthymic & affect congruent. Behavior is appropriate.    Data Reviewed: I have personally reviewed following labs and imaging studies  CBC: Recent Labs  Lab 08/14/19 1244 08/15/19 0437 08/16/19 0839 08/17/19 0457  WBC 9.4 8.5 10.3 8.0  HGB 12.5 11.0* 10.5* 9.8*  HCT 37.7 33.4* 32.1* 29.5*  MCV 92.2 92.3  94.4 91.3  PLT 276 271 233 811   Basic Metabolic Panel: Recent Labs  Lab 08/14/19 1244 08/15/19 0437 08/16/19 0839 08/17/19 0457  NA 137 140 138 133*  K 4.2 4.0 3.9 4.0  CL 104 107 104 101  CO2 23 25 24 25   GLUCOSE 98 92 104* 107*  BUN 19 17 20 16   CREATININE 0.73 0.81 0.96 0.70  CALCIUM 8.9 8.8* 8.5* 8.1*   Urine analysis:    Component Value Date/Time   COLORURINE YELLOW (A) 08/14/2019 1411   APPEARANCEUR CLEAR (A) 08/14/2019 1411   LABSPEC 1.016 08/14/2019 1411   PHURINE 6.0 08/14/2019 1411   GLUCOSEU NEGATIVE 08/14/2019 1411   HGBUR NEGATIVE 08/14/2019 1411   BILIRUBINUR NEGATIVE 08/14/2019 1411   KETONESUR 5 (A) 08/14/2019 1411   PROTEINUR NEGATIVE 08/14/2019 1411   NITRITE NEGATIVE 08/14/2019 1411   LEUKOCYTESUR NEGATIVE 08/14/2019 1411   Recent Results (from the past 240 hour(s))  SARS CORONAVIRUS 2 (TAT 6-24 HRS) Nasopharyngeal Nasopharyngeal Swab     Status: None   Collection Time: 08/14/19 12:44 PM   Specimen: Nasopharyngeal Swab  Result Value Ref Range Status   SARS Coronavirus 2 NEGATIVE NEGATIVE Final    Comment: (NOTE) SARS-CoV-2 target nucleic acids are NOT DETECTED. The SARS-CoV-2 RNA is generally detectable in upper and lower respiratory specimens during the acute phase of infection. Negative results do not preclude SARS-CoV-2 infection, do not rule out co-infections with other pathogens, and should not be used as the sole basis for treatment  or other patient management decisions. Negative results must be combined with clinical observations, patient history, and epidemiological information. The expected result is Negative. Fact Sheet for Patients: HairSlick.no Fact Sheet for Healthcare Providers: quierodirigir.com This test is not yet approved or cleared by the [place] FDA and  has been authorized for detection and/or diagnosis of SARS-CoV-2 by FDA under an Emergency Use Authorization  (EUA). This EUA will remain  in effect (meaning this test can be used) for the duration of the COVID-19 declaration under Section 56 4(b)(1) of the Act, 21 U.S.C. section 360bbb-3(b)(1), unless the authorization is terminated or revoked sooner. Performed at [facility], [address], [place], [place] [address]   Urine Culture     Status: None   Collection Time: 08/14/19  2:11 PM   Specimen: Urine, Clean Catch  Result Value Ref Range Status   Specimen Description   Final    URINE, CLEAN CATCH Performed at [facility], [address] [place], [place] [address]    Special Requests   Final    Normal Performed at [facility], [address], [place], [place] [address]    Culture   Final    NO GROWTH Performed at [facility], [address], [place], [place] [address]    Report Status 08/15/2019 FINAL  Final  CULTURE, BLOOD (ROUTINE X 2) w Reflex to ID Panel     Status: None (Preliminary result)   Collection Time: 08/16/19  7:00 AM   Specimen: BLOOD  Result Value Ref Range Status   Specimen Description BLOOD LAC  Final   Special Requests   Final    BOTTLES DRAWN AEROBIC AND ANAEROBIC Blood Culture results may not be optimal due to an excessive volume of blood received in culture bottles   Culture   Final    NO GROWTH < 24 HOURS Performed at [facility], [address] [place], [place] [address]    Report Status PENDING  Incomplete  CULTURE, BLOOD (ROUTINE X 2) w Reflex to ID Panel     Status: None (Preliminary result)   Collection Time: 08/16/19  7:09 AM   Specimen: BLOOD  Result Value Ref Range Status   Specimen Description BLOOD L HAND  Final   Special Requests   Final    BOTTLES DRAWN AEROBIC AND ANAEROBIC Blood Culture adequate volume   Culture   Final    NO GROWTH < 24 HOURS Performed at [facility], [address] [place], [place] [address]    Report Status PENDING  Incomplete      Radiology Studies: DG  Chest 1 View  Result Date: 08/15/2019 CLINICAL DATA:  Chest pain EXAM: CHEST  1 VIEW COMPARISON:  12/27/2018 FINDINGS: Cardiomegaly. Both lungs are clear. Interval kyphoplasty of a lower thoracic vertebral body. IMPRESSION: Cardiomegaly without acute abnormality of the lungs in AP portable projection. Electronically Signed   By: Lauralyn Primes M.D.   On: 08/15/2019 20:21   DG Chest Port 1 View  Result Date: 08/17/2019 CLINICAL DATA:  Fever. Recent right humeral fracture. EXAM: PORTABLE CHEST 1 VIEW COMPARISON:  April 2nd 2021 FINDINGS: Cardiomediastinal silhouette is enlarged. Mediastinal contours appear intact. Calcific atherosclerotic disease of the aorta There is no evidence of focal airspace consolidation, pleural effusion or pneumothorax. Chronic coarsening in the interstitial markings. IMPRESSION: 1. No active disease in the chest. 2. Chronic coarsening in interstitial markings. 3. Calcific atherosclerotic disease of the aorta. Electronically Signed   By:  Ted Mcalpine M.D.   On: 08/17/2019 11:06    Scheduled Meds: . acetaminophen  650 mg Oral Q6H  . acidophilus  1 capsule Oral Daily  . Chlorhexidine Gluconate Cloth  6 each Topical Daily  . cholecalciferol  1,000 Units Oral Daily  . enoxaparin (LOVENOX) injection  40 mg Subcutaneous Q24H  . feeding supplement (ENSURE ENLIVE)  237 mL Oral BID BM  . gabapentin  100 mg Oral q morning - 10a  . gabapentin  200 mg Oral QHS  . magnesium oxide  400 mg Oral Daily  . pantoprazole  40 mg Oral Daily  . polyethylene glycol  17 g Oral Daily  . polyvinyl alcohol  1 drop Both Eyes Daily  . senna-docusate  2 tablet Oral BID  . sodium chloride flush  3 mL Intravenous Q12H  . vitamin B-12  1,000 mcg Oral Daily   Continuous Infusions: . dextrose 5 % and 0.45% NaCl 75 mL/hr at 08/17/19 0500     LOS: 3 days   Time spent: 25 minutes.  Tyrone Nine, MD [facility] [email or web address] 08/17/2019, 12:09 PM

## 2019-08-17 NOTE — Plan of Care (Signed)
Continuing with plan of care. 

## 2019-08-18 LAB — BASIC METABOLIC PANEL
Anion gap: 7 (ref 5–15)
BUN: 16 mg/dL (ref 8–23)
CO2: 27 mmol/L (ref 22–32)
Calcium: 8.7 mg/dL — ABNORMAL LOW (ref 8.9–10.3)
Chloride: 100 mmol/L (ref 98–111)
Creatinine, Ser: 0.78 mg/dL (ref 0.44–1.00)
GFR calc Af Amer: >60 mL/min (ref >60)
GFR calc non Af Amer: >60 mL/min (ref >60)
Glucose, Bld: 151 mg/dL — ABNORMAL HIGH (ref 70–99)
Potassium: 4 mmol/L (ref 3.5–5.1)
Sodium: 134 mmol/L — ABNORMAL LOW (ref 135–145)

## 2019-08-18 LAB — CBC
HCT: 30 % — ABNORMAL LOW (ref 36.0–46.0)
Hemoglobin: 10.1 g/dL — ABNORMAL LOW (ref 12.0–15.0)
MCH: 30.8 pg (ref 26.0–34.0)
MCHC: 33.7 g/dL (ref 30.0–36.0)
MCV: 91.5 fL (ref 80.0–100.0)
Platelets: 226 10*3/uL (ref 150–400)
RBC: 3.28 MIL/uL — ABNORMAL LOW (ref 3.87–5.11)
RDW: 12.2 % (ref 11.5–15.5)
WBC: 6.6 10*3/uL (ref 4.0–10.5)
nRBC: 0 % (ref 0.0–0.2)

## 2019-08-18 MED ORDER — DEXTROSE IN LACTATED RINGERS 5 % IV SOLN: INTRAVENOUS | Status: DC

## 2019-08-18 MED ORDER — APIXABAN 5 MG PO TABS: 5.0000 mg | ORAL_TABLET | Freq: Two times a day (BID) | ORAL | Status: DC

## 2019-08-18 MED ORDER — APIXABAN 5 MG PO TABS
10.0000 mg | ORAL_TABLET | Freq: Two times a day (BID) | ORAL | Status: DC
  Administered 2019-08-18 – 2019-08-19 (×4): 10 mg via ORAL
  Filled 2019-08-18 (×4): qty 2

## 2019-08-18 MED ORDER — SODIUM CHLORIDE 0.9 % IV BOLUS
500.0000 mL | Freq: Once | INTRAVENOUS | Status: AC
  Administered 2019-08-18: 500 mL via INTRAVENOUS

## 2019-08-18 NOTE — Progress Notes (Signed)
Physical Therapy Treatment Patient Details Name: Lauren Roman MRN: [id number] DOB: February 22, 1929 Today's Date: 08/18/2019    History of Present Illness Pt is a 86 y.o. female that presented to ED after a fall, xray showed Acute mildly impacted and angulated fracture of the proximal humerus surgical neck. Per ortho plan is immobilization, and no evidence of pelvic fx. PMH of arthritis, dementia, migraine, hepatitis, hyperlipidemia, MG, tremor (r hand), previous falls, L hip fx, kyphoplasty. Pt also noted to have L wrist fracture, currently in wrist brace.    PT Comments    Pt asleep but awakens with verbal cues.  She keeps eyes closed throughout session only opening them briefly with cues.  She does agree to try sitting today.  Max a/dependant x 2 to EOB with no attempt to assist with mobility.  She does remain sitting x 4 minutes with mod a x 1 and is unable to remain sitting without assist.  Standing and transfers deferred due to assist and alertness levels.     Follow Up Recommendations  SNF     Equipment Recommendations       Recommendations for Other Services       Precautions / Restrictions Precautions Precautions: Fall;Shoulder Shoulder Interventions: At all times;Shoulder sling/immobilizer Precaution Booklet Issued: No Required Braces or Orthoses: Other Brace Other Brace: L Velcro wrist brace Restrictions Weight Bearing Restrictions: Yes RUE Weight Bearing: Non weight bearing LUE Weight Bearing: (Pt able to put weight while wearing velcro brace) Other Position/Activity Restrictions: per ortho consult and follow up notes in 05/2019, "wear L wrist brace at all times except for showering"'; brace will "allow her to begin grasping her wheelchair to assist with ambulation."    Mobility  Bed Mobility Overal bed mobility: Needs Assistance Bed Mobility: Supine to Sit;Sit to Supine     Supine to sit: Total assist;+2 for physical assistance Sit to supine: Total assist;+2 for  physical assistance   General bed mobility comments: unable to assist with transitions  Transfers                 General transfer comment: deferred due to lethargy  Ambulation/Gait                 Stairs             Wheelchair Mobility    Modified Rankin (Stroke Patients Only)       Balance Overall balance assessment: Needs assistance Sitting-balance support: Single extremity supported;Feet supported Sitting balance-Leahy Scale: Poor Sitting balance - Comments: uanble to sit without mod a x 1.     Standing balance-Leahy Scale: Zero Standing balance comment: uanble                            Cognition Arousal/Alertness: Lethargic Behavior During Therapy: WFL for tasks assessed/performed Overall Cognitive Status: History of cognitive impairments - at baseline                                 General Comments: keeps eyes closed but opens slightly with prompting      Exercises Other Exercises Other Exercises: sitting EOB x 4 minutes with mod a x 1    General Comments        Pertinent Vitals/Pain Pain Assessment: Faces Faces Pain Scale: Hurts whole lot Pain Location: R shoulder with any movement and at rest. - Pain Descriptors / Indicators: Grimacing;Guarding;Moaning  Pain Intervention(s): Limited activity within patient's tolerance;Monitored during session;Repositioned    Home Living                      Prior Function            PT Goals (current goals can now be found in the care plan section) Progress towards PT goals: Not progressing toward goals - comment    Frequency    7X/week      PT Plan Current plan remains appropriate    Co-evaluation              AM-PAC PT "6 Clicks" Mobility   Outcome Measure  Help needed turning from your back to your side while in a flat bed without using bedrails?: Total Help needed moving from lying on your back to sitting on the side of a flat bed  without using bedrails?: Total Help needed moving to and from a bed to a chair (including a wheelchair)?: Total Help needed standing up from a chair using your arms (e.g., wheelchair or bedside chair)?: Total Help needed to walk in hospital room?: Total Help needed climbing 3-5 steps with a railing? : Total 6 Click Score: 6    End of Session Equipment Utilized During Treatment: Oxygen Activity Tolerance: Patient limited by fatigue;Patient limited by pain Patient left: in bed;with call bell/phone within reach;with bed alarm set Nurse Communication: Mobility status Pain - Right/Left: Right Pain - part of body: Shoulder     Time: 5170-0174 PT Time Calculation (min) (ACUTE ONLY): 9 min  Charges:  $Therapeutic Activity: 8-22 mins                    Chesley Noon, PTA 08/18/19, 9:53 AM

## 2019-08-18 NOTE — Progress Notes (Addendum)
PROGRESS NOTE  Lauren Roman  TDS:[id number] DOB: Mar 15, 1929 DOA: 08/14/2019 PCP: Danella Penton, MD   Brief Narrative: Lauren Roman is an 86 y.o. female with a history of multiple fractures on chronic opioids, osteoporosis, GERD, HTN, HLD, mild dementia, myasthenia gravis not on therapy, DVT no longer on xarelto, vitamin B12 deficiency, GERD who presented from home where she had a witnessed fall onto the right side. In the ED, work up revealed a fracture of the right humerus and no definite fracture seen on hip XR or subsequent pelvic CT (limited by degree of osteopenia). Urinalysis was collected, antibiotics started empirically though culture has returned negative and antibiotics are stopped. Orthopedic surgery was consulted, recommending conservative management. The patient is usually walker-dependent for mobility and now unable to use this and requires rehabilitation prior to returning home. SNF placement is sought.  Assessment & Plan: Principal Problem:   Fall at home Active Problems:   UTI (urinary tract infection)   Adult idiopathic generalized osteoporosis   B12 deficiency   Chronic osteoarthritis   Goals of care, counseling/discussion   Palliative care by specialist   DNR (do not resuscitate)  Right humerus fracture due to fall at home, severe osteoporosis: XR right shoulder demonstrates impacted, angulated proximal humerus surgical neck fracture. Uses walker for mobility at baseline, now this is complicated by humerus fracture.  - Orthopedics consulted. Conservative management - Continue tylenol, will have this scheduled to minimize narcotics. Continue gabapentin, oxycodone  Hypoxia: Possibly due to sedation/hypoventilation at night. No documented hypoxia. Lungs clear, no active changes on CXR on my personal review this morning. - Wean O2.   Fever: UA negative as above, blood cultures have been negative, CXR without infiltrate. Possibly related to   - R/o DVT - Monitor  cultures (blood 4/3) - ?DC opioids if continues - Tylenol as above  Acute metabolic encephalopathy on chronic mild dementia: Having progressive decline over past few months with immobility and fractures. No evidence of significant infection. Retention may be contributing, and hospital delirium is a significant risk for this patient. - Stabilizing. Continue delirium precautions. Reviewed need to minimize sedating medications w/pt and daughter. - Has not tolerated treatments for dementia per daughter.  Soft blood pressure: Has this at baseline with slight worsening today, possibly due to poor per oral intake.  - Give 500cc bolus and given hyponatremia, will change to isotonic fluids. No leukocytosis or recurrent fever to suggest sepsis, no tachycardia to suggest PE though pt on anticoagulation regardless.  - Holding propranolol  Atypical chest pain: CXR, troponin normal, not consistent with acute cardiothoracic condition. This pain has resolved.  - Continue to monitor. Remains resolved.  Urinary retention:  - Stopped vesicare for OAB, will perform voiding trial today. Asked RN to bladder scan 8 hours after removal regardless of voiding and reinsert foley if >300cc.  Constipation:  - Improved with enema. LBM 4/3.  Left leg DVT: +SFV acute DVT on U/S 4/4. May explain fevers. - Started eliquis, stopped prophylactic lovenox.   Essential tremor:  - Holding propranolol for now with soft BPs  GERD:  - Continue PPI  OAB:  - Hold home med.  Osteoporosis, multiple fractures: Per PCP note, bisphosphonate was stopped due to severe esophagitis. We will not restart this.  - Optimize nutritional status with ensure/protein supplementation.  Vitamin B12 deficiency:  - Continue supplementation.  DVT prophylaxis: Lovenox ppx Code Status: DNR Family Communication: Daughter at bedside Disposition Plan: Will proceed with augmentation of IV fluids given her  hypotension, trial of voiding now.  Will wean to room air and encourage incentive spirometry. The patient's daughter wishes for the patient to get PT and OT to attempt rehabilitation, though does not believe the patient will be cared for/fed regularly at [facility]. No SNF is currently allowing daily visitation by family members, so the patient will be discharged home once vitals have stabilized. Will arrange for maximal home health and DME in anticipation of DC to daughter's house 4/6.  Consultants:   Orthopedics  Procedures:   Foley catheter 4/2 - 4/5  Antimicrobials:  Ceftriaxone 4/1 x1  Subjective: Patient more drowsy this AM with hypotension. Awakens without difficulty complaining of neck stiffness and pain, right shoulder pain. No urinary complaints and no shortness of breath.   Objective: Vitals:   08/17/19 1959 08/18/19 0442 08/18/19 1250 08/18/19 1251  BP: (!) 121/45 (!) 123/46 (!) 111/44 (!) 99/45  Pulse: 78 76 74   Resp: 16 18 16    Temp: 98.6 F (37 C) 98 F (36.7 C) 98.3 F (36.8 C)   TempSrc: Oral  Oral   SpO2: 99% 98% 93% 93%   Gen: Tired-appearing elderly female in no distress Pulm: Nonlabored breathing 2L O2 97%, normal rate, clear. CV: Regular rate and rhythm. No murmur, rub, or gallop. No JVD, no dependent edema. GI: Abdomen soft, non-tender, non-distended, with normoactive bowel sounds.  Ext: Warm, right arm in sling, less TTP along lateral upper arm. No skin tenting. Negative homan's bilaterally. Skin: No new rashes, lesions or ulcers on visualized skin. Feels diffusely warm, has many blankets on Neuro: Alert and oriented, more interactive. No focal neurological deficits. Psych: Judgement and insight appear fair. Mood euthymic & affect congruent. Behavior is appropriate.    Data Reviewed: I have personally reviewed following labs and imaging studies  CBC: Recent Labs  Lab 08/14/19 1244 08/15/19 0437 08/16/19 0839 08/17/19 0457 08/18/19 0531  WBC 9.4 8.5 10.3 8.0 6.6  HGB 12.5 11.0* 10.5*  9.8* 10.1*  HCT 37.7 33.4* 32.1* 29.5* 30.0*  MCV 92.2 92.3 94.4 91.3 91.5  PLT 276 271 233 217 226   Basic Metabolic Panel: Recent Labs  Lab 08/14/19 1244 08/15/19 0437 08/16/19 0839 08/17/19 0457 08/18/19 0531  NA 137 140 138 133* 134*  K 4.2 4.0 3.9 4.0 4.0  CL 104 107 104 101 100  CO2 23 25 24 25 27   GLUCOSE 98 92 104* 107* 151*  BUN 19 17 20 16 16   CREATININE 0.73 0.81 0.96 0.70 0.78  CALCIUM 8.9 8.8* 8.5* 8.1* 8.7*   Urine analysis:    Component Value Date/Time   COLORURINE YELLOW (A) 08/14/2019 1411   APPEARANCEUR CLEAR (A) 08/14/2019 1411   LABSPEC 1.016 08/14/2019 1411   PHURINE 6.0 08/14/2019 1411   GLUCOSEU NEGATIVE 08/14/2019 1411   HGBUR NEGATIVE 08/14/2019 1411   BILIRUBINUR NEGATIVE 08/14/2019 1411   KETONESUR 5 (A) 08/14/2019 1411   PROTEINUR NEGATIVE 08/14/2019 1411   NITRITE NEGATIVE 08/14/2019 1411   LEUKOCYTESUR NEGATIVE 08/14/2019 1411   Recent Results (from the past 240 hour(s))  SARS CORONAVIRUS 2 (TAT 6-24 HRS) Nasopharyngeal Nasopharyngeal Swab     Status: None   Collection Time: 08/14/19 12:44 PM   Specimen: Nasopharyngeal Swab  Result Value Ref Range Status   SARS Coronavirus 2 NEGATIVE NEGATIVE Final    Comment: (NOTE) SARS-CoV-2 target nucleic acids are NOT DETECTED. The SARS-CoV-2 RNA is generally detectable in upper and lower respiratory specimens during the acute phase of infection. Negative results do not preclude  SARS-CoV-2 infection, do not rule out co-infections with other pathogens, and should not be used as the sole basis for treatment or other patient management decisions. Negative results must be combined with clinical observations, patient history, and epidemiological information. The expected result is Negative. Fact Sheet for Patients: HairSlick.no Fact Sheet for Healthcare Providers: quierodirigir.com This test is not yet approved or cleared by the [place]  FDA and  has been authorized for detection and/or diagnosis of SARS-CoV-2 by FDA under an Emergency Use Authorization (EUA). This EUA will remain  in effect (meaning this test can be used) for the duration of the COVID-19 declaration under Section 56 4(b)(1) of the Act, 21 U.S.C. section 360bbb-3(b)(1), unless the authorization is terminated or revoked sooner. Performed at [facility], [address] [place], [place] [address]   Urine Culture     Status: None   Collection Time: 08/14/19  2:11 PM   Specimen: Urine, Clean Catch  Result Value Ref Range Status   Specimen Description   Final    URINE, CLEAN CATCH Performed at [facility], [address], [place], [place] [address]    Special Requests   Final    Normal Performed at [facility], [address] [place], [place] [address]    Culture   Final    NO GROWTH Performed at [facility], [address], [place], [place] [address]    Report Status 08/15/2019 FINAL  Final  CULTURE, BLOOD (ROUTINE X 2) w Reflex to ID Panel     Status: None (Preliminary result)   Collection Time: 08/16/19  7:00 AM   Specimen: BLOOD  Result Value Ref Range Status   Specimen Description BLOOD LAC  Final   Special Requests   Final    BOTTLES DRAWN AEROBIC AND ANAEROBIC Blood Culture results may not be optimal due to an excessive volume of blood received in culture bottles   Culture   Final    NO GROWTH 2 DAYS Performed at [facility], [address], [place], [place] [address]    Report Status PENDING  Incomplete  CULTURE, BLOOD (ROUTINE X 2) w Reflex to ID Panel     Status: None (Preliminary result)   Collection Time: 08/16/19  7:09 AM   Specimen: BLOOD  Result Value Ref Range Status   Specimen Description BLOOD L HAND  Final   Special Requests   Final    BOTTLES DRAWN AEROBIC AND ANAEROBIC Blood Culture adequate volume   Culture   Final    NO GROWTH 2 DAYS Performed at [facility],  [address], [place], [place] [address]    Report Status PENDING  Incomplete      Radiology Studies: US Venous Img Lower Bilateral (DVT)  Result Date: 08/17/2019 CLINICAL DATA:  Leg swelling and elevated D-dimer. EXAM: BILATERAL LOWER EXTREMITY VENOUS DOPPLER ULTRASOUND TECHNIQUE: Gray-scale sonography with graded compression, as well as color Doppler and duplex ultrasound were performed to evaluate the lower extremity deep venous systems from the level of the common femoral vein and including the common femoral, femoral, profunda femoral, popliteal and calf veins including the posterior tibial, peroneal and gastrocnemius veins when visible. The superficial great saphenous vein was also interrogated. Spectral Doppler was utilized to evaluate flow at rest and with distal augmentation maneuvers in the common femoral, femoral and popliteal veins. COMPARISON:  None. FINDINGS: RIGHT LOWER EXTREMITY Common Femoral Vein: No evidence of thrombus. Normal compressibility, respiratory phasicity and response to  augmentation. Saphenofemoral Junction: No evidence of thrombus. Normal compressibility and flow on color Doppler imaging. Profunda Femoral Vein: No evidence of thrombus. Normal compressibility and flow on color Doppler imaging. Femoral Vein: No evidence of thrombus. Normal compressibility, respiratory phasicity and response to augmentation. Popliteal Vein: No evidence of thrombus. Normal compressibility, respiratory phasicity and response to augmentation. Calf Veins: No evidence of thrombus. Normal compressibility and flow on color Doppler imaging. Superficial Great Saphenous Vein: No evidence of thrombus. Normal compressibility. Other Findings:  None. LEFT LOWER EXTREMITY Common Femoral Vein: No evidence of thrombus. Normal compressibility, respiratory phasicity and response to augmentation. Saphenofemoral Junction: No evidence of thrombus. Normal compressibility and flow on color Doppler imaging. Profunda  Femoral Vein: No evidence of thrombus. Normal compressibility and flow on color Doppler imaging. Femoral Vein: Nonocclusive thrombus is noted in the proximal left femoral vein. The remaining portions of the left superficial femoral vein are unremarkable. Popliteal Vein: No evidence of thrombus. Normal compressibility, respiratory phasicity and response to augmentation. Calf Veins: No evidence of thrombus. Normal compressibility and flow on color Doppler imaging. Superficial Great Saphenous Vein: No evidence of thrombus. Normal compressibility. Other Findings:  None. IMPRESSION: Study is positive for nonocclusive deep vein thrombosis within the proximal left superficial femoral vein. No DVT identified involving the right lower extremity. Electronically Signed   By: Constance Holster M.D.   On: 08/17/2019 18:54   DG Chest Port 1 View  Result Date: 08/17/2019 CLINICAL DATA:  Fever. Recent right humeral fracture. EXAM: PORTABLE CHEST 1 VIEW COMPARISON:  April 2nd 2021 FINDINGS: Cardiomediastinal silhouette is enlarged. Mediastinal contours appear intact. Calcific atherosclerotic disease of the aorta There is no evidence of focal airspace consolidation, pleural effusion or pneumothorax. Chronic coarsening in the interstitial markings. IMPRESSION: 1. No active disease in the chest. 2. Chronic coarsening in interstitial markings. 3. Calcific atherosclerotic disease of the aorta. Electronically Signed   By: Fidela Salisbury M.D.   On: 08/17/2019 11:06    Scheduled Meds: . acetaminophen  650 mg Oral Q6H  . acidophilus  1 capsule Oral Daily  . apixaban  10 mg Oral BID   Followed by  . [START ON 08/25/2019] apixaban  5 mg Oral BID  . Chlorhexidine Gluconate Cloth  6 each Topical Daily  . cholecalciferol  1,000 Units Oral Daily  . feeding supplement (ENSURE ENLIVE)  237 mL Oral BID BM  . gabapentin  100 mg Oral q morning - 10a  . gabapentin  200 mg Oral QHS  . magnesium oxide  400 mg Oral Daily  .  pantoprazole  40 mg Oral Daily  . polyethylene glycol  17 g Oral Daily  . polyvinyl alcohol  1 drop Both Eyes Daily  . senna-docusate  2 tablet Oral BID  . sodium chloride flush  3 mL Intravenous Q12H  . vitamin B-12  1,000 mcg Oral Daily   Continuous Infusions: . dextrose 5% lactated ringers    . sodium chloride       LOS: 4 days   Total time spent 40 minutes in seeing, examining patient, discussing with patient and family, coordinating patient care, greater than 50% of which was spent on the floor and/or at the bedside.   Patrecia Pour, MD [facility] [email or web address] 08/18/2019, 1:10 PM

## 2019-08-18 NOTE — Care Management (Signed)
Patient has Chronic osteoarthritis which requires torso to be positioned in ways not feasible with a normal bed. Head must be elevated at least 30 or pain is increased. Chronic osteoarthritisfrequently requires frequent changes in body position which cannot be achieved with a normal bed.

## 2019-08-18 NOTE — TOC Progression Note (Signed)
Transition of Care [facility]) - Progression Note    Patient Details  Name: ANTONINA DEZIEL MRN: [id number] Date of Birth: [birth year]  [facility]) CM/SW Contact  Chapman Fitch, RN Phone Number: 08/18/2019, 4:24 PM  Clinical Narrative:    RNCM followed up with daughter regarding SNF placement.  Bed offers presented.  Daughter's first preference is [facility].  They are able to offer.  Daughter expresses that she wants to be able to come to the patient's room daily to feed her.  At this time due to covid restrictions there are no facilities that offer daily visits in the residents room. MD and bedside RN present during this conversation.    Daughter expresses that she will be taking the patient home with her to her home. Will need hospital be and hoyer lift.  Documentation completed and referral made to [facility] with [facility].   Daughter in agreement to home health services.  She selected [facility] as agency preference.  Referral made to [facility] with [facility]  Bedside to check to see if she qualifies for O2 at home   Expected Discharge Plan: Skilled Nursing Facility Barriers to Discharge: Continued Medical Work up  Expected Discharge Plan and Services Expected Discharge Plan: Skilled Nursing Facility       Living arrangements for the past 2 months: Single Family Home                                       Social Determinants of Health (SDOH) Interventions    Readmission Risk Interventions No flowsheet data found.

## 2019-08-18 NOTE — Care Management Important Message (Signed)
Important Message  Patient Details  Name: Lauren Roman MRN: [id number] Date of Birth: 1929-04-25   Medicare Important Message Given:  Yes  Reviewed with daughter, Olegario Messier, over phone.  Aware of right.     [name] 08/18/2019, 12:03 PM

## 2019-08-18 NOTE — Progress Notes (Signed)
Daughter reports the patient has been  talking about thinking about dying a spiritual care consult has been placed.

## 2019-08-18 NOTE — Progress Notes (Signed)
Patient to be bladder scan at 8:30pm.

## 2019-08-18 NOTE — Progress Notes (Signed)
Chaplain responded to OR, patient's daughter reported patient was talking about dying. Chaplain listened as patient's daughter and patient brother shared patient stated she saw deceased love ones telling her to come home, offered ministry of presence, emotional support to family. Family requested that Chaplain pray for patient if they wake her. Patient asked Chaplain if she was going to die. The more the Chaplain and family members communicated with patient it became clear to the patient that she was confused about her fall and being in the hospital. Chaplain offered prayer for comfort and healing.

## 2019-08-18 NOTE — Consult Note (Signed)
ANTICOAGULATION CONSULT NOTE  Pharmacy Consult for apixaban for acute DVT Indication: DVT  Vital Signs: Temp: 98 F (36.7 C) (04/05 0442) Temp Source: Oral (04/04 1959) BP: 123/46 (04/05 0442) Pulse Rate: 76 (04/05 0442)  Labs: Recent Labs    08/15/19 2014 08/15/19 2148 08/16/19 0839 08/16/19 0839 08/17/19 0457 08/18/19 0531  HGB  --   --  10.5*   < > 9.8* 10.1*  HCT  --   --  32.1*  --  29.5* 30.0*  PLT  --   --  233  --  217 226  CREATININE  --   --  0.96  --  0.70 0.78  TROPONINIHS 9 10  --   --   --   --    < > = values in this interval not displayed.    CrCl cannot be calculated (Unknown ideal weight.).   Medical History: Past Medical History:  Diagnosis Date  . Arthritis   . Dementia (HCC)    mild  . Headache    migraines  . Hepatitis 1952   hep b-no problems since then  . Hyperlipidemia   . Myasthenia gravis (HCC)   . Tremor    right hand    Medications:  Scheduled:  . acetaminophen  650 mg Oral Q6H  . acidophilus  1 capsule Oral Daily  . apixaban  10 mg Oral BID   Followed by  . [START ON 08/25/2019] apixaban  5 mg Oral BID  . Chlorhexidine Gluconate Cloth  6 each Topical Daily  . cholecalciferol  1,000 Units Oral Daily  . feeding supplement (ENSURE ENLIVE)  237 mL Oral BID BM  . gabapentin  100 mg Oral q morning - 10a  . gabapentin  200 mg Oral QHS  . magnesium oxide  400 mg Oral Daily  . pantoprazole  40 mg Oral Daily  . polyethylene glycol  17 g Oral Daily  . polyvinyl alcohol  1 drop Both Eyes Daily  . senna-docusate  2 tablet Oral BID  . sodium chloride flush  3 mL Intravenous Q12H  . vitamin B-12  1,000 mcg Oral Daily    Assessment: 86 y.o.femalewith a history ofmultiple fractureson chronic opioids, osteoporosis, GERD, HTN, HLD, mild dementia, myasthenia gravisnot on therapy, DVT no longer on xarelto, vitamin B12 deficiency, GERD who presented from home where she had a witnessed fall onto the right side. In the ED, work up  revealed a fracture of the right humerus. Subsequently, an ultrasound revealed a nonocclusive deep vein thrombosis within the proximal left superficial femoral vein. The last dose of anticoagulant therapy during her inpatient admission was enoxaparin 08/17/19 at 2154. H&H slightly lower than baseline but stable, platelets are stable and WNL.  Goal of Therapy:  Monitor platelets by anticoagulation protocol: Yes   Plan:   Start apixaban 10 mg twice daily for 7 days followed by 5 mg twice daily  CBC in am  Lowella Bandy 08/18/2019,7:02 AM

## 2019-08-18 NOTE — TOC Transition Note (Deleted)
Transition of Care [facility]) - CM/SW Discharge Note   Patient Details  Name: Lauren Roman MRN: [id number] Date of Birth: 04-Dec-1928  [facility] CM/SW Contact:  Chapman Fitch, RN Phone Number: 08/18/2019, 1:12 PM   Clinical Narrative:     Patient to discharge home today  [place] with [facility] notified of discharge  Final next level of care: Skilled Nursing Facility Barriers to Discharge: Continued Medical Work up   Patient Goals and CMS Choice     Choice offered to / list presented to : Adult Children  Discharge Placement                       Discharge Plan and Services                                     Social Determinants of Health (SDOH) Interventions     Readmission Risk Interventions No flowsheet data found.

## 2019-08-19 DIAGNOSIS — Z7189 Other specified counseling: Secondary | ICD-10-CM

## 2019-08-19 LAB — CBC
HCT: 33.8 % — ABNORMAL LOW (ref 36.0–46.0)
Hemoglobin: 11.5 g/dL — ABNORMAL LOW (ref 12.0–15.0)
MCH: 30.3 pg (ref 26.0–34.0)
MCHC: 34 g/dL (ref 30.0–36.0)
MCV: 88.9 fL (ref 80.0–100.0)
Platelets: 255 10*3/uL (ref 150–400)
RBC: 3.8 MIL/uL — ABNORMAL LOW (ref 3.87–5.11)
RDW: 12.2 % (ref 11.5–15.5)
WBC: 8.9 10*3/uL (ref 4.0–10.5)
nRBC: 0 % (ref 0.0–0.2)

## 2019-08-19 MED ORDER — POLYETHYLENE GLYCOL 3350 17 G PO PACK: 17.0000 g | PACK | Freq: Every day | ORAL | 0 refills | Status: AC

## 2019-08-19 MED ORDER — SENNOSIDES-DOCUSATE SODIUM 8.6-50 MG PO TABS: 1.0000 | ORAL_TABLET | Freq: Two times a day (BID) | ORAL | 0 refills | Status: AC

## 2019-08-19 MED ORDER — APIXABAN 5 MG PO TABS: ORAL_TABLET | ORAL | 0 refills | Status: AC

## 2019-08-19 MED ORDER — OXYCODONE HCL 5 MG PO TABS: 5.0000 mg | ORAL_TABLET | Freq: Four times a day (QID) | ORAL | 0 refills | Status: AC | PRN

## 2019-08-19 NOTE — Progress Notes (Signed)
Physical Therapy Treatment Patient Details Name: Lauren Roman MRN: [id number] DOB: 27-Nov-1928 Today's Date: 08/19/2019    History of Present Illness Pt is a 86 y.o. female that presented to ED after a fall, xray showed Acute mildly impacted and angulated fracture of the proximal humerus surgical neck. Per ortho plan is immobilization, and no evidence of pelvic fx. PMH of arthritis, dementia, migraine, hepatitis, hyperlipidemia, MG, tremor (r hand), previous falls, L hip fx, kyphoplasty. Pt also noted to have L wrist fracture, currently in wrist brace.    PT Comments    Pt was supine in bed, with HOB elevated ~ 25 degrees upon therapist arriving. [name] at bedside and is planning to bring pt home with her at DC. Pt did not want to get OOB but was agreeable to performing bed exercises. Therapist issued there ex handout and pt performed. See exercises performed list below. Most of session spent educating pt/pt's daughter about concerns with DC to home. Therapist issued gait belt and bedpan for home use. Therapist had pt perform rolling L with increased time and max assist to demonstrate to daughter what will have to perform when pt needs to have BM at home. She states understanding and pt tolerated well. Lengthy discussion about expectations from [employer]. Therapist answered questions about hoyer lift and how to use hospital bed to assist/ safety perform mobility. Pt was left in bed with bed alarm set and call bell in reach. Daughter at bedside and RN informed of pt's abilities.   Follow Up Recommendations  SNF;Supervision/Assistance - 24 hour;Supervision for mobility/OOB     Equipment Recommendations  Hospital bed;Rolling walker with 5" wheels;Other (comment)(hoyer lift. Pt is having equipment delivered this date)    Recommendations for Other Services       Precautions / Restrictions Precautions Precautions: Fall;Shoulder Shoulder Interventions: At all times;Shoulder  sling/immobilizer Precaution Booklet Issued: Yes (comment)(general LE strengthening handout) Required Braces or Orthoses: Other Brace Other Brace: L Velcro wrist brace Restrictions Weight Bearing Restrictions: Yes RUE Weight Bearing: Non weight bearing LUE Weight Bearing: (allowed to use RW with L wrist brace donned) Other Position/Activity Restrictions: per ortho consult and follow up notes in 05/2019, "wear L wrist brace at all times except for showering"'; brace will "allow her to begin grasping her wheelchair to assist with ambulation."    Mobility  Bed Mobility Overal bed mobility: Needs Assistance Bed Mobility: (rolling for bedpan use)           General bed mobility comments: Pt refused EOB sitting this date 2/2 to pain and fatigue. [name] present and has questions about use of bed pan. Therapist had pt roll to L with max assist of one. Educated pt's daughter on use of bedpan and assisting with rolling.  Transfers                 General transfer comment: unsafe  Ambulation/Gait                 Stairs             Wheelchair Mobility    Modified Rankin (Stroke Patients Only)       Balance                                            Cognition Arousal/Alertness: Lethargic Behavior During Therapy: WFL for tasks assessed/performed Overall Cognitive Status: History of  cognitive impairments - at baseline                                 General Comments: keeps eyes closed but opens slightly with prompting      Exercises General Exercises - Lower Extremity Ankle Circles/Pumps: AROM;20 reps Quad Sets: AROM;Both;20 reps Gluteal Sets: AROM;Both;20 reps Short Arc Quad: AROM;Both;20 reps Heel Slides: AROM;Both;20 reps Hip ABduction/ADduction: AROM;Both;20 reps Straight Leg Raises: AROM;Both;10 reps    General Comments        Pertinent Vitals/Pain Pain Assessment: 0-10 Pain Score: 6  Faces Pain Scale:  Hurts whole lot Pain Location: R shoulder with any movement and at rest. - Pain Descriptors / Indicators: Grimacing;Guarding;Moaning Pain Intervention(s): Limited activity within patient's tolerance;Monitored during session;Repositioned    Home Living                      Prior Function            PT Goals (current goals can now be found in the care plan section) Acute Rehab PT Goals Patient Stated Goal: to get better Progress towards PT goals: Progressing toward goals    Frequency    7X/week      PT Plan Current plan remains appropriate    Co-evaluation     PT goals addressed during session: Other (comment)(upcoming D/C to home-focused on caregiver education)        AM-PAC PT "6 Clicks" Mobility   Outcome Measure  Help needed turning from your back to your side while in a flat bed without using bedrails?: A Lot Help needed moving from lying on your back to sitting on the side of a flat bed without using bedrails?: Total Help needed moving to and from a bed to a chair (including a wheelchair)?: Total Help needed standing up from a chair using your arms (e.g., wheelchair or bedside chair)?: Total Help needed to walk in hospital room?: Total Help needed climbing 3-5 steps with a railing? : Total 6 Click Score: 7    End of Session   Activity Tolerance: Patient limited by pain;Patient limited by fatigue Patient left: in bed;with call bell/phone within reach;with bed alarm set Nurse Communication: Mobility status PT Visit Diagnosis: Other abnormalities of gait and mobility (R26.89);Muscle weakness (generalized) (M62.81);History of falling (Z91.81);Pain Pain - Right/Left: Right Pain - part of body: Shoulder     Time: 5170-0174 PT Time Calculation (min) (ACUTE ONLY): 35 min  Charges:  $Therapeutic Exercise: 8-22 mins $Therapeutic Activity: 8-22 mins                     Jetta Lout PTA 08/19/19, 12:42 PM

## 2019-08-19 NOTE — TOC Transition Note (Signed)
Transition of Care [facility]) - CM/SW Discharge Note   Patient Details  Name: Lauren Roman MRN: [id number] Date of Birth: [birth year]  [facility] CM/SW Contact:  Chapman Fitch, RN Phone Number: 08/19/2019, 4:30 PM   Clinical Narrative:    Patient to discharge today Per daughter Bed and lift are to be delivered between 3-5 pm   Patient can discharge via ems after equipment arrives  [place] with [facility] notified of discharge  Patient patient changed to partial code.  Will not require DNR sheet for transport   Final next level of care: Home w Home Health Services Barriers to Discharge: No Barriers Identified   Patient Goals and CMS Choice     Choice offered to / list presented to : Adult Children  Discharge Placement                       Discharge Plan and Services                DME Arranged: Hospital bed, Other see comment(lift) DME Agency: [employer] Date DME Agency Contacted: 08/19/19   Representative spoke with at DME Agency: Nida Boatman HH Arranged: PT, OT, Nurse's Aide, RN HH Agency: [facility] ([facility] Date HH Agency Contacted: 08/19/19   Representative spoke with at [facility] Agency: Barbara Cower  Social Determinants of Health (SDOH) Interventions     Readmission Risk Interventions No flowsheet data found.

## 2019-08-19 NOTE — Progress Notes (Signed)
The patient was wean off 2L of oxygen. The patient oxygen level is 92-93% on room air. Oxygen qualifying stats could not be obtained completely as the patient was not able to get out if the bed and ambulate. She was only able to sit at the side of the bed when PT attempted to work with the patient.

## 2019-08-19 NOTE — Discharge Summary (Signed)
Physician Discharge Summary  Lauren Roman VPX:[id number] DOB: 05/17/1928 DOA: 08/14/2019  PCP: Danella Penton, MD  Admit date: 08/14/2019 Discharge date: 08/19/2019  Admitted From: Home Disposition: Home   Recommendations for Outpatient Follow-up:  1. Follow up with PCP in 1-2 weeks 2. Home health to follow patient closely. SNF was recommended but their covid restrictions prevent the patient's daughter from daily visits (only 2x/week allowed). She will take the patient home with maximal HH and DME support. Palliative care was consulted during this admission and, after continued daily discussions with the patient and her daughter, we've recommended that home hospice be called in the event the patient does not show improvement with therapy. She is at very high risk of readmission. 3. Continue foley catheter care, inserted again 4/6 after repeatedly retaining urine.  Home Health: PT, OT, RN, aide, CSW Equipment/Devices: Hoyer lift, hospital bed, has wheelchair Discharge Condition: Stable CODE STATUS: Partial. No CPR, no intubation. Diet recommendation: As tolerated.  Brief/Interim Summary: Lauren Roman [phone] y.o.femalewith a history ofmultiple fractureson chronic opioids, osteoporosis, GERD, HTN, HLD, mild dementia, myasthenia gravisnot on therapy, DVT no longer on xarelto, vitamin B12 deficiency, GERD who presented from home where she had a witnessed fall onto the right side. In the ED, work up revealed a fracture of the right humerus and no definite fracture seen on hip XR or subsequent pelvic CT (limited by degree of osteopenia). Urinalysis was collected, antibiotics started empirically though culture has returned negative and antibiotics are stopped. Orthopedic surgery was consulted, recommending conservative management. The patient is usually walker-dependent for mobility and now unable to use this and requires rehabilitation prior to returning home. SNF placement was sought though  due to restriction visitation policy, the patient will instead discharge home with maximal HH and DME with daughter. See below for full details.  Discharge Diagnoses:  Principal Problem:   Fall at home Active Problems:   UTI (urinary tract infection)   Adult idiopathic generalized osteoporosis   B12 deficiency   Chronic osteoarthritis   Goals of care, counseling/discussion   Palliative care by specialist   DNR (do not resuscitate)  Right humerus fracture due to fall at home, severe osteoporosis: XR right shoulder demonstrates impacted, angulatedproximal humerus surgical neckfracture. Uses walker for mobility at baseline, now this is complicated by humerus fracture.  - Orthopedics consulted. Conservative management with sling. - Continue tylenol, recommend scheduled to minimize narcotics. Continue gabapentin, oxycodone. PDMP reviewed, last Rx for oxycodone from pain mgmt was Feb 9.  Hypoxia: Possibly due to atelectasis and hypoventilation worse at night. Resolved.  Fever: UA negative as above, blood cultures have been negative, CXR without infiltrate. Possibly related to DVT - Monitor cultures (blood 4/3) NGTD (afebrile since then) - Tylenol as above  Acute metabolic encephalopathy on chronic mild dementia: Having progressive decline over past few months with immobility and fractures. No evidence of significant infection. Retention may be contributing, and hospital delirium is a significant risk for this patient. - Stabilizing. Continue delirium precautions. Reviewed need to minimize sedating medications w/pt and daughter. - Has not tolerated treatments for dementia per daughter.  Soft blood pressure: No leukocytosis or recurrent fever to suggest sepsis, no tachycardia to suggest PE though pt on anticoagulation regardless.  - Holding propranolol until follow up.  Myasthenia gravis: Does not appear to be an active issue.  - Patient is on prednisone 5mg  though is unclear as to its  indication. This is the possible reason for steroid. With soft BP while holding  it, it was recommended that they continue this longterm medication until follow up for possible taper with PCP. This of course could worsen osteoporotic risk.  Atypical chest pain: CXR, troponin normal, not consistent with acute cardiothoracic condition. This pain has resolved.  - Continue to monitor. Remains resolved.  Urinary retention:  - Stopped vesicare for OAB, voiding trial performed but will reinsert foley as the patient continues to retain >300cc.   Constipation:  - Improved with enema. Continue bowel management at home.   Left leg DVT: +SFV acute DVT on U/S 4/4. May explain fevers. - Started eliquis which will be continued at DC.  Essential tremor:  - Holding propranolol for now with soft BPs  GERD:  - Continue PPI  OAB:  - Hold home med with evidence of urinary retention here.  Osteoporosis, multiple fractures: Per PCP note, bisphosphonate was stopped due to severe esophagitis. We will not restart this.  - Optimize nutritional status with ensure/protein supplementation.  Vitamin B12 deficiency:  - Continue supplementation.  Goals of care discussions/counseling: The patient is very frail now with injury and significant impact to functional mobility. The patient has been speaking about dying, seeing deceased loved ones, afterlife imagery, etc. She and her daughter opt for no CPR or intubation but would still want a "chemical code." The expected futility in this was shared, as was a very real possibility that the patient will fail to rehabilitate to a reasonable quality of life. If this becomes the case, especially if the patient remains in pain, home hospice care should be sought. This was discussed at length on multiple occasions.   Discharge Instructions Discharge Instructions    Discharge instructions   Complete by: As directed    Start taking eliquis as directed to treat the blood  clot in the left leg Start taking oxycodone with new parameters to allow for more than twice daily dosing if needed for acute pain, a new prescription was sent to your pharmacy. With increased narcotics, you should take laxatives. Stop taking vesicare and propranolol Continue foley catheter care which will be reviewed with you prior to discharge Continue home health services for rehabilitation. If this appears to not be improving mobility and/or quality of life, would suggest contacting hospice to come into the home for an assessment to follow. Keep the sling in place. An appointment has been made with the orthopedic surgeon for follow up care. If your symptoms are uncontrolled or new symptoms arise, seek medical advice right away.     Allergies as of 08/19/2019      Reactions   Exelon [rivastigmine Tartrate]    Patient unsure of reaction   Hydrocodone-acetaminophen Nausea Only   Sometimes make her nausea    Other Itching, Rash   Anything metal on skin will cause rash and itching      Medication List    STOP taking these medications   cefUROXime 250 MG tablet Commonly known as: CEFTIN   nitrofurantoin 50 MG capsule Commonly known as: MACRODANTIN   propranolol 20 MG tablet Commonly known as: INDERAL   VESIcare 10 MG tablet Generic drug: solifenacin     TAKE these medications   acetaminophen 650 MG CR tablet Commonly known as: TYLENOL Take 650 mg by mouth as needed for pain.   Align 4 MG Caps Take 4 mg by mouth daily.   apixaban 5 MG Tabs tablet Commonly known as: Eliquis Take 2 tablets ( ) twice daily for 7 days, then 1 tablet ( ) twice daily  aspirin EC 81 MG tablet Take 81 mg by mouth daily.   fluticasone 50 MCG/ACT nasal spray Commonly known as: FLONASE Place 1 spray into both nostrils daily.   gabapentin 100 MG capsule Commonly known as: NEURONTIN Take 100-200 mg by mouth See admin instructions. Take 1 capsule ( ) by mouth every morning and take 2  capsules ( ) by mouth every evening   magnesium oxide 400 MG tablet Commonly known as: MAG-OX Take 400 mg by mouth daily.   omeprazole 40 MG capsule Commonly known as: PRILOSEC Take 40 mg by mouth daily.   oxyCODONE 5 MG immediate release tablet Commonly known as: Roxicodone Take 1-2 tablets (5-10 mg total) by mouth every 6 (six) hours as needed for up to 5 days for moderate pain or severe pain. What changed:   how much to take  when to take this  reasons to take this   polyethylene glycol 17 g packet Commonly known as: MIRALAX / GLYCOLAX Take 17 g by mouth daily. Start taking on: August 20, 2019   predniSONE 5 MG tablet Commonly known as: DELTASONE Take 5 mg by mouth daily.   senna-docusate 8.6-50 MG tablet Commonly known as: Senokot-S Take 1 tablet by mouth 2 (two) times daily.   SYSTANE COMPLETE OP Place 1 drop into both eyes daily.   vitamin B-12 1000 MCG tablet Commonly known as: CYANOCOBALAMIN Take 1,000 mcg by mouth daily.   Vitamin D-1000 Max St 25 MCG (1000 UT) tablet Generic drug: Cholecalciferol Take 1,000 Units by mouth daily.   vitamin k 100 MCG tablet Take 100 mcg by mouth daily.            Durable Medical Equipment  (From admission, onward)         Start     Ordered   08/18/19 1300  For home use only DME Hospital bed  Once    Question Answer Comment  Length of Need Lifetime   Patient has (list medical condition): Chronic osteoarthritis   The above medical condition requires: Patient requires the ability to reposition frequently   Head must be elevated greater than: 30 degrees   Bed type Semi-electric   Hoyer Lift Yes   Support Surface: Gel Overlay      08/18/19 1300         Follow-up Information    Donato Heinz, MD. Go on 09/02/2019.   Specialty: Orthopedic Surgery Why: 9:30am.  Contact information: [address] [facility] [address] [facility] [place] [address] [phone]        Danella Penton, MD. Schedule an  appointment as soon as possible for a visit in 1 week(s).   Specialty: Internal Medicine Contact information: [phone] [facility] [address] [facility] [place] [address] [phone]          Allergies  Allergen Reactions  . Exelon [Rivastigmine Tartrate]     Patient unsure of reaction  . Hydrocodone-Acetaminophen Nausea Only    Sometimes make her nausea   . Other Itching and Rash    Anything metal on skin will cause rash and itching    Consultations:  Orthopedics  Palliative care  Procedures/Studies: DG Chest 1 View  Result Date: 08/15/2019 CLINICAL DATA:  Chest pain EXAM: CHEST  1 VIEW COMPARISON:  12/27/2018 FINDINGS: Cardiomegaly. Both lungs are clear. Interval kyphoplasty of a lower thoracic vertebral body. IMPRESSION: Cardiomegaly without acute abnormality of the lungs in AP portable projection. Electronically Signed   By: Lauralyn Primes M.D.   On:  08/15/2019 20:21   DG Shoulder Right  Result Date: 08/14/2019 CLINICAL DATA:  Severe right shoulder pain after fall. EXAM: RIGHT SHOULDER - 2+ VIEW COMPARISON:  None. FINDINGS: Acute mildly impacted and angulated fracture of the proximal humerus surgical neck. No dislocation. Osteopenia. The visualized right lung is clear. Prior lower thoracic kyphoplasty. IMPRESSION: 1. Acute mildly impacted and angulated fracture of the proximal humerus surgical neck. Electronically Signed   By: Obie DredgeWilliam T Derry M.D.   On: 08/14/2019 12:00   CT Head Wo Contrast  Result Date: 08/14/2019 CLINICAL DATA:  Fall.  Head injury with headache EXAM: CT HEAD WITHOUT CONTRAST CT CERVICAL SPINE WITHOUT CONTRAST TECHNIQUE: Multidetector CT imaging of the head and cervical spine was performed following the standard protocol without intravenous contrast. Multiplanar CT image reconstructions of the cervical spine were also generated. COMPARISON:  None. FINDINGS: CT HEAD FINDINGS Brain: Mild atrophy. Chronic ischemic changes in the white matter.  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 Negative for acute infarct, hemorrhage, or mass Vascular: Negative for hyperdense vessel Skull: Negative for skull fracture Sinuses/Orbits: Negative Other: None CT CERVICAL SPINE FINDINGS Alignment: Mild anterolisthesis C2-3.  Mild retrolisthesis C3-4. Skull base and vertebrae: Negative for fracture Soft tissues and spinal canal: 12 mm right thyroid nodule. No further imaging is required for this lesion. Atherosclerotic calcification of the carotid bifurcation bilaterally. Disc levels: Disc degeneration and spurring throughout the cervical spine. Mild spinal stenosis C3-4, C4-5, C5-6, C6-7 due to spurring. Upper chest: Lung apices clear bilaterally. Other: None IMPRESSION: 1. No acute intracranial abnormality. Atrophy and chronic microvascular ischemia 2. Negative for cervical spine fracture. Moderate to advanced cervical spondylosis. Electronically Signed   By: Marlan Palauharles  Clark M.D.   On: 08/14/2019 11:53   CT Cervical Spine Wo Contrast  Result Date: 08/14/2019 CLINICAL DATA:  Fall.  Head injury with headache EXAM: CT HEAD WITHOUT CONTRAST CT CERVICAL SPINE WITHOUT CONTRAST TECHNIQUE: Multidetector CT imaging of the head and cervical spine was performed following the standard protocol without intravenous contrast. Multiplanar CT image reconstructions of the cervical spine were also generated. COMPARISON:  None. FINDINGS: CT HEAD FINDINGS Brain: Mild atrophy. Chronic ischemic changes in the white matter. 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 Negative for acute infarct, hemorrhage, or mass Vascular: Negative for hyperdense vessel Skull: Negative for skull fracture Sinuses/Orbits: Negative Other: None CT CERVICAL SPINE FINDINGS Alignment: Mild anterolisthesis C2-3.  Mild retrolisthesis C3-4. Skull base and vertebrae: Negative for fracture Soft tissues and spinal canal: 12 mm right thyroid nodule. No further imaging is required for this  lesion. Atherosclerotic calcification of the carotid bifurcation bilaterally. Disc levels: Disc degeneration and spurring throughout the cervical spine. Mild spinal stenosis C3-4, C4-5, C5-6, C6-7 due to spurring. Upper chest: Lung apices clear bilaterally. Other: None IMPRESSION: 1. No acute intracranial abnormality. Atrophy and chronic microvascular ischemia 2. Negative for cervical spine fracture. Moderate to advanced cervical spondylosis. Electronically Signed   By: Marlan Palauharles  Clark M.D.   On: 08/14/2019 11:53   CT PELVIS WO CONTRAST  Result Date: 08/14/2019 CLINICAL DATA:  Fall, pelvic pain EXAM: CT PELVIS WITHOUT CONTRAST TECHNIQUE: Multidetector CT imaging of the pelvis was performed following the standard protocol without intravenous contrast. COMPARISON:  Same day radiographs FINDINGS: Urinary Tract:  Distended urinary bladder. Bowel: Large burden of stool and stool balls in the rectum. Vascular/Lymphatic: No pathologically enlarged lymph nodes. Aortic atherosclerosis. Reproductive:  No mass or other significant abnormality Other:  None. Musculoskeletal: Severe osteopenia. Intramedullary nail fixation of  the left femoral neck. IMPRESSION: 1. Severe osteopenia. No acute displaced fracture of the hips or pelvis. 2. Intramedullary nail fixation of the left femoral neck. No evidence of hardware complication. 3.  Distended urinary bladder.  Correlate for urinary retention. 4.  Large burden of stool and stool balls in the rectum. 5.  Aortic Atherosclerosis (ICD10-I70.0). Electronically Signed   By: Lauralyn Primes M.D.   On: 08/14/2019 15:34   US Venous Img Lower Bilateral (DVT)  Result Date: 08/17/2019 CLINICAL DATA:  Leg swelling and elevated D-dimer. EXAM: BILATERAL LOWER EXTREMITY VENOUS DOPPLER ULTRASOUND TECHNIQUE: Gray-scale sonography with graded compression, as well as color Doppler and duplex ultrasound were performed to evaluate the lower extremity deep venous systems from the level of the common  femoral vein and including the common femoral, femoral, profunda femoral, popliteal and calf veins including the posterior tibial, peroneal and gastrocnemius veins when visible. The superficial great saphenous vein was also interrogated. Spectral Doppler was utilized to evaluate flow at rest and with distal augmentation maneuvers in the common femoral, femoral and popliteal veins. COMPARISON:  None. FINDINGS: RIGHT LOWER EXTREMITY Common Femoral Vein: No evidence of thrombus. Normal compressibility, respiratory phasicity and response to augmentation. Saphenofemoral Junction: No evidence of thrombus. Normal compressibility and flow on color Doppler imaging. Profunda Femoral Vein: No evidence of thrombus. Normal compressibility and flow on color Doppler imaging. Femoral Vein: No evidence of thrombus. Normal compressibility, respiratory phasicity and response to augmentation. Popliteal Vein: No evidence of thrombus. Normal compressibility, respiratory phasicity and response to augmentation. Calf Veins: No evidence of thrombus. Normal compressibility and flow on color Doppler imaging. Superficial Great Saphenous Vein: No evidence of thrombus. Normal compressibility. Other Findings:  None. LEFT LOWER EXTREMITY Common Femoral Vein: No evidence of thrombus. Normal compressibility, respiratory phasicity and response to augmentation. Saphenofemoral Junction: No evidence of thrombus. Normal compressibility and flow on color Doppler imaging. Profunda Femoral Vein: No evidence of thrombus. Normal compressibility and flow on color Doppler imaging. Femoral Vein: Nonocclusive thrombus is noted in the proximal left femoral vein. The remaining portions of the left superficial femoral vein are unremarkable. Popliteal Vein: No evidence of thrombus. Normal compressibility, respiratory phasicity and response to augmentation. Calf Veins: No evidence of thrombus. Normal compressibility and flow on color Doppler imaging. Superficial Great  Saphenous Vein: No evidence of thrombus. Normal compressibility. Other Findings:  None. IMPRESSION: Study is positive for nonocclusive deep vein thrombosis within the proximal left superficial femoral vein. No DVT identified involving the right lower extremity. Electronically Signed   By: Katherine Mantle M.D.   On: 08/17/2019 18:54   DG Chest Port 1 View  Result Date: 08/17/2019 CLINICAL DATA:  Fever. Recent right humeral fracture. EXAM: PORTABLE CHEST 1 VIEW COMPARISON:  April 2nd 2021 FINDINGS: Cardiomediastinal silhouette is enlarged. Mediastinal contours appear intact. Calcific atherosclerotic disease of the aorta There is no evidence of focal airspace consolidation, pleural effusion or pneumothorax. Chronic coarsening in the interstitial markings. IMPRESSION: 1. No active disease in the chest. 2. Chronic coarsening in interstitial markings. 3. Calcific atherosclerotic disease of the aorta. Electronically Signed   By: Ted Mcalpine M.D.   On: 08/17/2019 11:06   DG Hip Unilat W or Wo Pelvis 2-3 Views Right  Result Date: 08/14/2019 CLINICAL DATA:  Fall EXAM: DG HIP (WITH OR WITHOUT PELVIS) 2-3V RIGHT COMPARISON:  None. FINDINGS: Severe osteopenia. There is no evidence of displaced hip fracture or dislocation. Partially imaged intramedullary nail fixation of the left proximal femur. There is no evidence of arthropathy  or other focal bone abnormality. Aortic atherosclerosis. IMPRESSION: Severe osteopenia, which significantly limits sensitivity for fracture. Within this limitation, no evidence of displaced right hip or pelvic fracture. Recommend CT or MRI to more sensitively assess for fracture if there is high clinical concern. Electronically Signed   By: Lauralyn Primes M.D.   On: 08/14/2019 13:29   DG ESOPHAGUS W SINGLE CM (SOL OR THIN BA)  Result Date: 07/28/2019 CLINICAL DATA:  Trouble swallowing. EXAM: ESOPHOGRAM/BARIUM SWALLOW TECHNIQUE: Single contrast examination was performed using  thin  barium. FLUOROSCOPY TIME:  Fluoroscopy Time:  1.8 minute Radiation Exposure Index (if provided by the fluoroscopic device): 4 mGy Number of Acquired Spot Images: 0 COMPARISON:  None. FINDINGS: Normal pharyngeal anatomy and motility. Contrast flowed freely through the esophagus without evidence of a stricture or mass. Normal esophageal mucosa without evidence of irregularity or ulceration. Intermittent tertiary contractions of the esophagus as can be seen with mild spasm. Mild gastroesophageal reflux. No definite hiatal hernia was demonstrated. The patient was unable to swallow a barium tablet. IMPRESSION: Intermittent tertiary contractions of the esophagus as can be seen with mild spasm. Mild gastroesophageal reflux. Electronically Signed   By: Elige Ko   On: 07/28/2019 12:44   Foley catheter insertion 4/6  Subjective: Pain is severe, improved with medications. Pt feels very weak with chronic back pain as well as acute right shoulder pain worse with manipulation.   Discharge Exam: Vitals:   08/19/19 0843 08/19/19 1656  BP: (!) 178/65 (!) 126/51  Pulse: 89 87  Resp: 16   Temp: 97.6 F (36.4 C)   SpO2: 99% 95%   General: Pt is alert, awake, in evident discomfort Cardiovascular: RRR, S1/S2 +, no rubs, no gallops Respiratory: CTA bilaterally, no wheezing, no rhonchi Abdominal: Soft, NT, ND, bowel sounds + Extremities: Very minimal LLE edema without pitting, RUE with internal rotation and shoulder TTP. Distally NVI, without cyanosis  Labs: BNP (last 3 results) No results for input(s): BNP in the last 8760 hours. Basic Metabolic Panel: Recent Labs  Lab 08/14/19 1244 08/15/19 0437 08/16/19 0839 08/17/19 0457 08/18/19 0531  NA 137 140 138 133* 134*  K 4.2 4.0 3.9 4.0 4.0  CL 104 107 104 101 100  CO2 23 25 24 25 27   GLUCOSE 98 92 104* 107* 151*  BUN 19 17 20 16 16   CREATININE 0.73 0.81 0.96 0.70 0.78  CALCIUM 8.9 8.8* 8.5* 8.1* 8.7*   Liver Function Tests: No results for  input(s): AST, ALT, ALKPHOS, BILITOT, PROT, ALBUMIN in the last 168 hours. No results for input(s): LIPASE, AMYLASE in the last 168 hours. No results for input(s): AMMONIA in the last 168 hours. CBC: Recent Labs  Lab 08/15/19 0437 08/16/19 0839 08/17/19 0457 08/18/19 0531 08/19/19 0412  WBC 8.5 10.3 8.0 6.6 8.9  HGB 11.0* 10.5* 9.8* 10.1* 11.5*  HCT 33.4* 32.1* 29.5* 30.0* 33.8*  MCV 92.3 94.4 91.3 91.5 88.9  PLT 271 233 217 226 255   Cardiac Enzymes: No results for input(s): CKTOTAL, CKMB, CKMBINDEX, TROPONINI in the last 168 hours. BNP: Invalid input(s): POCBNP CBG: No results for input(s): GLUCAP in the last 168 hours. D-Dimer No results for input(s): DDIMER in the last 72 hours. Hgb A1c No results for input(s): HGBA1C in the last 72 hours. Lipid Profile No results for input(s): CHOL, HDL, LDLCALC, TRIG, CHOLHDL, LDLDIRECT in the last 72 hours. Thyroid function studies No results for input(s): TSH, T4TOTAL, T3FREE, THYROIDAB in the last 72 hours.  Invalid input(s): FREET3  Anemia work up No results for input(s): VITAMINB12, FOLATE, FERRITIN, TIBC, IRON, RETICCTPCT in the last 72 hours. Urinalysis    Component Value Date/Time   COLORURINE YELLOW (A) 08/14/2019 1411   APPEARANCEUR CLEAR (A) 08/14/2019 1411   LABSPEC 1.016 08/14/2019 1411   PHURINE 6.0 08/14/2019 1411   GLUCOSEU NEGATIVE 08/14/2019 1411   HGBUR NEGATIVE 08/14/2019 1411   BILIRUBINUR NEGATIVE 08/14/2019 1411   KETONESUR 5 (A) 08/14/2019 1411   PROTEINUR NEGATIVE 08/14/2019 1411   NITRITE NEGATIVE 08/14/2019 1411   LEUKOCYTESUR NEGATIVE 08/14/2019 1411    Microbiology Recent Results (from the past 240 hour(s))  SARS CORONAVIRUS 2 (TAT 6-24 HRS) Nasopharyngeal Nasopharyngeal Swab     Status: None   Collection Time: 08/14/19 12:44 PM   Specimen: Nasopharyngeal Swab  Result Value Ref Range Status   SARS Coronavirus 2 NEGATIVE NEGATIVE Final    Comment: (NOTE) SARS-CoV-2 target nucleic acids are  NOT DETECTED. The SARS-CoV-2 RNA is generally detectable in upper and lower respiratory specimens during the acute phase of infection. Negative results do not preclude SARS-CoV-2 infection, do not rule out co-infections with other pathogens, and should not be used as the sole basis for treatment or other patient management decisions. Negative results must be combined with clinical observations, patient history, and epidemiological information. The expected result is Negative. Fact Sheet for Patients: HairSlick.no Fact Sheet for Healthcare Providers: quierodirigir.com This test is not yet approved or cleared by the [place] FDA and  has been authorized for detection and/or diagnosis of SARS-CoV-2 by FDA under an Emergency Use Authorization (EUA). This EUA will remain  in effect (meaning this test can be used) for the duration of the COVID-19 declaration under Section 56 4(b)(1) of the Act, 21 U.S.C. section 360bbb-3(b)(1), unless the authorization is terminated or revoked sooner. Performed at [facility], [address] [place], [place] [address]   Urine Culture     Status: None   Collection Time: 08/14/19  2:11 PM   Specimen: Urine, Clean Catch  Result Value Ref Range Status   Specimen Description   Final    URINE, CLEAN CATCH Performed at [facility], [address] [place], [place] [address]    Special Requests   Final    Normal Performed at [facility], [address], [place], [place] [address]    Culture   Final    NO GROWTH Performed at [facility], [address] [place], [place] [address]    Report Status 08/15/2019 FINAL  Final  CULTURE, BLOOD (ROUTINE X 2) w Reflex to ID Panel     Status: None (Preliminary result)   Collection Time: 08/16/19  7:00 AM   Specimen: BLOOD  Result Value Ref Range Status   Specimen Description BLOOD LAC  Final   Special Requests   Final     BOTTLES DRAWN AEROBIC AND ANAEROBIC Blood Culture results may not be optimal due to an excessive volume of blood received in culture bottles   Culture   Final    NO GROWTH 3 DAYS Performed at [facility], [address], [place], [place] [address]    Report Status PENDING  Incomplete  CULTURE, BLOOD (ROUTINE X 2) w Reflex to ID Panel     Status: None (Preliminary result)   Collection Time: 08/16/19  7:09 AM   Specimen: BLOOD  Result Value Ref Range Status   Specimen Description BLOOD L HAND  Final   Special Requests   Final  BOTTLES DRAWN AEROBIC AND ANAEROBIC Blood Culture adequate volume   Culture   Final    NO GROWTH 3 DAYS Performed at [facility], [address] [place], [place] [address]    Report Status PENDING  Incomplete    Time coordinating discharge: Approximately 40 minutes  Patrecia Pour, MD  [facility] 08/19/2019, 5:53 PM

## 2019-08-19 NOTE — Plan of Care (Signed)
The patient has been stable. Awaiting for EMS for transportation to go home with daughter. Foley catheter education has been completed with the daughter.  Problem: Education: Goal: Knowledge of General Education information will improve Description: Including pain rating scale, medication(s)/side effects and non-pharmacologic comfort measures Outcome: Completed/Met   Problem: Health Behavior/Discharge Planning: Goal: Ability to manage health-related needs will improve Outcome: Completed/Met   Problem: Clinical Measurements: Goal: Ability to maintain clinical measurements within normal limits will improve Outcome: Completed/Met Goal: Will remain free from infection Outcome: Completed/Met Goal: Diagnostic test results will improve Outcome: Completed/Met Goal: Respiratory complications will improve Outcome: Completed/Met Goal: Cardiovascular complication will be avoided Outcome: Completed/Met   Problem: Activity: Goal: Risk for activity intolerance will decrease Outcome: Completed/Met   Problem: Nutrition: Goal: Adequate nutrition will be maintained Outcome: Completed/Met   Problem: Coping: Goal: Level of anxiety will decrease Outcome: Completed/Met   Problem: Elimination: Goal: Will not experience complications related to bowel motility Outcome: Completed/Met Goal: Will not experience complications related to urinary retention Outcome: Completed/Met   Problem: Pain Managment: Goal: General experience of comfort will improve Outcome: Completed/Met   Problem: Safety: Goal: Ability to remain free from injury will improve Outcome: Completed/Met   Problem: Skin Integrity: Goal: Risk for impaired skin integrity will decrease Outcome: Completed/Met   Problem: Acute Rehab OT Goals (only OT should resolve) Goal: Pt. Will Perform Eating Outcome: Completed/Met Goal: Pt. Will Transfer To Toilet Outcome: Completed/Met Goal: OT Additional ADL Goal #1 Outcome: Completed/Met   Problem: Acute Rehab PT Goals(only PT should resolve) Goal: Pt Will Go Supine/Side To Sit Outcome: Completed/Met Goal: Patient Will Transfer Sit To/From Stand Outcome: Completed/Met Goal: Pt Will Transfer Bed To Chair/Chair To Bed Outcome: Completed/Met Goal: Pt Will Ambulate Outcome: Completed/Met

## 2019-08-21 LAB — CULTURE, BLOOD (ROUTINE X 2)
Culture: NO GROWTH
Culture: NO GROWTH
Special Requests: ADEQUATE

## 2019-08-21 NOTE — Progress Notes (Signed)
[facility].  She is now living with her daughter, Genella Mech.  She was admitted s/p fall which she suffered a broken right shoulder.  Dr Jarvis Newcomer has prescribed oxycodone 5 1 -1 2 tabls every 6 hours but daughter reports she is giving her 1 - 2 tabs every 12 hours.

## 2019-08-25 ENCOUNTER — Other Ambulatory Visit: Payer: Self-pay

## 2019-08-25 ENCOUNTER — Ambulatory Visit
Payer: Medicare Other | Attending: Student in an Organized Health Care Education/Training Program | Admitting: Student in an Organized Health Care Education/Training Program

## 2019-08-25 ENCOUNTER — Encounter: Payer: Self-pay | Admitting: Student in an Organized Health Care Education/Training Program

## 2019-08-25 DIAGNOSIS — M5416 Radiculopathy, lumbar region: Secondary | ICD-10-CM | POA: Diagnosis not present

## 2019-08-25 DIAGNOSIS — M1990 Unspecified osteoarthritis, unspecified site: Secondary | ICD-10-CM

## 2019-08-25 DIAGNOSIS — S42201S Unspecified fracture of upper end of right humerus, sequela: Secondary | ICD-10-CM

## 2019-08-25 DIAGNOSIS — Z8781 Personal history of (healed) traumatic fracture: Secondary | ICD-10-CM

## 2019-08-25 DIAGNOSIS — Z9889 Other specified postprocedural states: Secondary | ICD-10-CM

## 2019-08-25 DIAGNOSIS — S72002S Fracture of unspecified part of neck of left femur, sequela: Secondary | ICD-10-CM

## 2019-08-25 DIAGNOSIS — G894 Chronic pain syndrome: Secondary | ICD-10-CM

## 2019-08-25 DIAGNOSIS — S42201A Unspecified fracture of upper end of right humerus, initial encounter for closed fracture: Secondary | ICD-10-CM | POA: Insufficient documentation

## 2019-08-25 DIAGNOSIS — G8929 Other chronic pain: Secondary | ICD-10-CM

## 2019-08-25 MED ORDER — OXYCODONE HCL 5 MG PO TABS: 5.0000 mg | ORAL_TABLET | Freq: Four times a day (QID) | ORAL | 0 refills | Status: DC | PRN

## 2019-08-25 NOTE — Progress Notes (Signed)
Patient: Lauren Roman  Service Category: E/M  Provider: Gillis Santa, MD  DOB: 1928/11/12  DOS: 08/25/2019  Location: Office  MRN: [id number]  Setting: Ambulatory outpatient  Referring Provider: Rusty Aus, MD  Type: Established Patient  Specialty: Interventional Pain Management  PCP: Lauren Aus, MD  Location: Home  Delivery: TeleHealth     Virtual Encounter - Pain Management PROVIDER NOTE: Information contained herein reflects review and annotations entered in association with encounter. Interpretation of such information and data should be left to medically-trained personnel. Information provided to patient can be located elsewhere in the medical record under "Patient Instructions". Document created using STT-dictation technology, any transcriptional errors that may result from process are unintentional.    Contact & Pharmacy Preferred: [phone] Home: [phone] (home) Mobile: [phone] (mobile) E-mail: [email or web address] .[place], [place] [address] Phone: [phone] Fax: [phone]   Pre-screening  Lauren Roman offered "in-person" vs "virtual" encounter. She indicated preferring virtual for this encounter.   Reason COVID-19*  Social distancing based on CDC and AMA recommendations.   I contacted Lauren Roman on 08/25/2019 via telephone.      I clearly identified myself as Lauren Santa, MD. I verified that I was speaking with the correct person using two identifiers (Name: Lauren Roman, and date of birth: 01/02/1929).  This visit was completed via telephone due to the restrictions of the COVID-19 pandemic. All issues as above were discussed and addressed but no physical exam was performed. If it was felt that the patient should be evaluated in the office, they were directed there. The patient verbally consented to this visit. Patient was unable to complete an audio/visual visit due to Technical  difficulties and/or Lack of internet. Due to the catastrophic nature of the COVID-19 pandemic, this visit was done through audio contact only.  Location of the patient: home address (see Epic for details)  Location of the provider: office  Consent I sought verbal advanced consent from Lauren Roman for virtual visit interactions. I informed Lauren Roman of possible security and privacy concerns, risks, and limitations associated with providing "not-in-person" medical evaluation and management services. I also informed Lauren Roman of the availability of "in-person" appointments. Finally, I informed her that there would be a charge for the virtual visit and that she could be  personally, fully or partially, financially responsible for it. Lauren Roman expressed understanding and agreed to proceed.   Historic Elements   Lauren Roman is a 86 y.o. year old, female patient evaluated today after her last contact with our practice on 06/24/2019. Lauren Roman  has a past medical history of Arthritis, Dementia ([place]), Headache, Hepatitis (1950), Hyperlipidemia, Myasthenia gravis ([place]), and Tremor. She also  has a past surgical history that includes Colonoscopy; Lumbar laminectomy/decompression microdiscectomy (N/A, 01/30/2018); Intramedullary (im) nail intertrochanteric (Left, 09/20/2018); and Kyphoplasty (N/A, 04/08/2019). Lauren Roman has a current medication list which includes the following prescription(s): acetaminophen, apixaban, aspirin ec, cholecalciferol, fluticasone, gabapentin, magnesium oxide, omeprazole, polyethylene glycol, prednisone, align, propylene glycol, senna-docusate, vitamin b-12, vitamin k, and [START ON 09/24/2019] oxycodone. She  reports that she quit smoking about 56 years ago. She has never used smokeless tobacco. She reports that she does not drink alcohol or use drugs. Lauren Roman is allergic to exelon [rivastigmine tartrate]; hydrocodone-acetaminophen; and other.   HPI  Today, she is being  contacted for new problems.   released from [place] on 08/20/19. She  is now living with her daughter, Lauren Roman.  She was admitted s/p fall which she suffered a broken right shoulder. Per Lauren Roman (her daughter), Lauren Roman fell on her walker which resulted in fracture of the right humerus and no definite fracture seen on hip XR or subsequent pelvic CT (limited by degree of osteopenia). Pt is utilizing APAP, Gabapentin, and Oxycodone 5 mg BID prn.  She is also working with home health PT.  I had an extensive discussion with the patient's daughter, Lauren Roman.  She states that she is fully involved in her mother's care and would like to see her recover from this.  She is concerned about her weight is helping to make sure that she takes in adequate calories during the day.   Pharmacotherapy Assessment  Analgesic: 06/24/2019  2   06/24/2019  Oxycodone Hcl 5 MG Tablet  60.00  30 Bi Lat   [id number]   Thr (5364)   0  15.00 MME  Comm Ins   [place]   Monitoring:  PMP: PDMP reviewed during this encounter.       Pharmacotherapy: No side-effects or adverse reactions reported. Compliance: No problems identified. Effectiveness: Clinically acceptable. Plan: Refer to "POC".  UDS:  Summary  Date Value Ref Range Status  06/10/2019 Note  Final    Comment:    ==================================================================== Compliance Drug Analysis, Ur ==================================================================== Test                             Result       Flag       Units Drug Present and Declared for Prescription Verification   Oxycodone                      734          EXPECTED   ng/mg creat   Oxymorphone                    1659         EXPECTED   ng/mg creat   Noroxycodone                   2153         EXPECTED   ng/mg creat   Noroxymorphone                 681          EXPECTED   ng/mg creat    Sources of oxycodone are scheduled prescription medications.    Oxymorphone, noroxycodone, and  noroxymorphone are expected    metabolites of oxycodone. Oxymorphone is also available as a    scheduled prescription medication.   Acetaminophen                  PRESENT      EXPECTED Drug Absent but Declared for Prescription Verification   Gabapentin                     Not Detected UNEXPECTED   Salicylate                     Not Detected UNEXPECTED    Aspirin, as indicated in the declared medication list, is not always    detected even when used as directed.   Propranolol                    Not  Detected UNEXPECTED ==================================================================== Test                      Result    Flag   Units      Ref Range   Creatinine              32               mg/dL      >=20 ==================================================================== Declared Medications:  The flagging and interpretation on this report are based on the  following declared medications.  Unexpected results may arise from  inaccuracies in the declared medications.  **Note: The testing scope of this panel includes these medications:  Gabapentin  Oxycodone  Propranolol  **Note: The testing scope of this panel does not include small to  moderate amounts of these reported medications:  Acetaminophen  Aspirin  **Note: The testing scope of this panel does not include the  following reported medications:  Cholecalciferol  Cyanocobalamin  Eye Drop  Fluticasone  Hydrochlorothiazide  Magnesium (Mag-Ox)  Nitrofurantoin (Macrobid)  Omeprazole  Prednisone  Probiotic  Rivaroxaban  Solifenacin (Vesicare)  Supplement ==================================================================== For clinical consultation, please call [phone]. ====================================================================    Laboratory Chemistry Profile   Renal Lab Results  Component Value Date   BUN 16 08/18/2019   CREATININE 0.78 08/18/2019   GFRAA >60 08/18/2019   GFRNONAA >60 08/18/2019      Hepatic Lab Results  Component Value Date   AST 32 08/28/2018   ALT 12 08/28/2018   ALBUMIN 4.2 08/28/2018   ALKPHOS 54 08/28/2018   LIPASE 19 08/28/2018     Electrolytes Lab Results  Component Value Date   NA 134 (L) 08/18/2019   K 4.0 08/18/2019   CL 100 08/18/2019   CALCIUM 8.7 (L) 08/18/2019   MG 2.1 12/30/2018     Bone No results found for: VD25OH, VD125OH2TOT, [id number], [id number], 25OHVITD1, 25OHVITD2, 25OHVITD3, TESTOFREE, TESTOSTERONE   Inflammation (CRP: Acute Phase) (ESR: Chronic Phase) Lab Results  Component Value Date   ESRSEDRATE 9 06/09/2013       Note: Above Lab results reviewed.  Imaging  US Venous Img Lower Bilateral (DVT) CLINICAL DATA:  Leg swelling and elevated D-dimer.  EXAM: BILATERAL LOWER EXTREMITY VENOUS DOPPLER ULTRASOUND  TECHNIQUE: Gray-scale sonography with graded compression, as well as color Doppler and duplex ultrasound were performed to evaluate the lower extremity deep venous systems from the level of the common femoral vein and including the common femoral, femoral, profunda femoral, popliteal and calf veins including the posterior tibial, peroneal and gastrocnemius veins when visible. The superficial great saphenous vein was also interrogated. Spectral Doppler was utilized to evaluate flow at rest and with distal augmentation maneuvers in the common femoral, femoral and popliteal veins.  COMPARISON:  None.  FINDINGS: RIGHT LOWER EXTREMITY  Common Femoral Vein: No evidence of thrombus. Normal compressibility, respiratory phasicity and response to augmentation.  Saphenofemoral Junction: No evidence of thrombus. Normal compressibility and flow on color Doppler imaging.  Profunda Femoral Vein: No evidence of thrombus. Normal compressibility and flow on color Doppler imaging.  Femoral Vein: No evidence of thrombus. Normal compressibility, respiratory phasicity and response to augmentation.  Popliteal Vein: No  evidence of thrombus. Normal compressibility, respiratory phasicity and response to augmentation.  Calf Veins: No evidence of thrombus. Normal compressibility and flow on color Doppler imaging.  Superficial Great Saphenous Vein: No evidence of thrombus. Normal compressibility.  Other Findings:  None.  LEFT LOWER EXTREMITY  Common Femoral Vein:  No evidence of thrombus. Normal compressibility, respiratory phasicity and response to augmentation.  Saphenofemoral Junction: No evidence of thrombus. Normal compressibility and flow on color Doppler imaging.  Profunda Femoral Vein: No evidence of thrombus. Normal compressibility and flow on color Doppler imaging.  Femoral Vein: Nonocclusive thrombus is noted in the proximal left femoral vein. The remaining portions of the left superficial femoral vein are unremarkable.  Popliteal Vein: No evidence of thrombus. Normal compressibility, respiratory phasicity and response to augmentation.  Calf Veins: No evidence of thrombus. Normal compressibility and flow on color Doppler imaging.  Superficial Great Saphenous Vein: No evidence of thrombus. Normal compressibility.  Other Findings:  None.  IMPRESSION: Study is positive for nonocclusive deep vein thrombosis within the proximal left superficial femoral vein. No DVT identified involving the right lower extremity.  Electronically Signed   By: Constance Holster M.D.   On: 08/17/2019 18:54 DG Chest Port 1 View CLINICAL DATA:  Fever. Recent right humeral fracture.  EXAM: PORTABLE CHEST 1 VIEW  COMPARISON:  April 2nd 2021  FINDINGS: Cardiomediastinal silhouette is enlarged. Mediastinal contours appear intact. Calcific atherosclerotic disease of the aorta  There is no evidence of focal airspace consolidation, pleural effusion or pneumothorax. Chronic coarsening in the interstitial markings.  IMPRESSION: 1. No active disease in the chest. 2. Chronic coarsening in interstitial  markings. 3. Calcific atherosclerotic disease of the aorta.  Electronically Signed   By: Fidela Salisbury M.D.   On: 08/17/2019 11:06  Assessment  The primary encounter diagnosis was Closed fracture of proximal end of right humerus, unspecified fracture morphology, sequela. Diagnoses of Lumbar radiculopathy, Closed fracture of left hip, sequela, H/O kyphoplasty, History of compression fracture of spine, History of lumbar laminectomy for spinal cord decompression (left L4/5), Chronic osteoarthritis, Chronic radicular lumbar pain, and Chronic pain syndrome were also pertinent to this visit.  Plan of Care   Lauren. Lauren Roman has a current medication list which includes the following long-term medication(s): apixaban, fluticasone, gabapentin, and omeprazole.  Pharmacotherapy (Medications Ordered): Meds ordered this encounter  Medications  . oxyCODONE (OXY IR/ROXICODONE) 5 MG immediate release tablet    Sig: Take 1 tablet (5 mg total) by mouth every 6 (six) hours as needed for severe pain. Must last 30 days.    Dispense:  60 tablet    Refill:  0    Chronic Pain. (STOP Act - Not applicable). Fill one day early if closed on scheduled refill date.   Follow-up plan:   Return in about 8 weeks (around 10/20/2019) for Medication Management, virtual.    Recent Visits Date Type Provider Dept  06/24/19 Office Visit Lauren Santa, MD [facility]  06/10/19 Office Visit Lauren Santa, MD [facility]  Showing recent visits within past 90 days and meeting all other requirements   Today's Visits Date Type Provider Dept  08/25/19 Office Visit Lauren Santa, MD [facility]  Showing today's visits and meeting all other requirements   Future Appointments No visits were found meeting these conditions.  Showing future appointments within next 90 days and meeting all other requirements   I discussed the assessment and treatment plan with the patient. The patient was  provided an opportunity to ask questions and all were answered. The patient agreed with the plan and demonstrated an understanding of the instructions.  Patient advised to call back or seek an in-person evaluation if the symptoms or condition worsens.  Duration of encounter:25 minutes.  Note by: Lauren Santa, MD Date: 08/25/2019; Time: 1:55  PM

## 2019-08-27 ENCOUNTER — Telehealth: Payer: Self-pay | Admitting: Student in an Organized Health Care Education/Training Program

## 2019-08-27 ENCOUNTER — Other Ambulatory Visit: Payer: Self-pay | Admitting: Student in an Organized Health Care Education/Training Program

## 2019-08-27 NOTE — Telephone Encounter (Signed)
I do not see a prescription for April. Can you please check on this and let me know so I can call the patient.

## 2019-08-27 NOTE — Progress Notes (Unsigned)
On her visit on 06/24/2019, see clinic note, she was written 2 prescriptions of oxycodone 5 mg twice daily as needed.  She has only picked up 1 of those prescriptions so she can pick up the second one now for April.  Her visit on 4/12 was for Rx to be filled in May.

## 2019-08-27 NOTE — Progress Notes (Signed)
Called to inform patient and her daughter answered and  stated that  she had fiqured it out.

## 2019-08-27 NOTE — Telephone Encounter (Signed)
Patients daughter Olegario Messier calling stating DR. Cherylann Ratel was supposed to escript in for April and May. The only thing pharmacy has is to fill in May. Can you please check on this and let them know status. Thank you

## 2019-09-02 ENCOUNTER — Ambulatory Visit: Payer: Medicare Other | Admitting: Physician Assistant

## 2019-09-02 ENCOUNTER — Other Ambulatory Visit: Payer: Self-pay

## 2019-09-02 ENCOUNTER — Encounter: Payer: Self-pay | Admitting: Urology

## 2019-09-02 ENCOUNTER — Ambulatory Visit (INDEPENDENT_AMBULATORY_CARE_PROVIDER_SITE_OTHER): Payer: Medicare Other | Admitting: Urology

## 2019-09-02 VITALS — BP 147/67 | HR 79 | Ht 60.0 in | Wt 110.0 lb

## 2019-09-02 DIAGNOSIS — N390 Urinary tract infection, site not specified: Secondary | ICD-10-CM

## 2019-09-02 DIAGNOSIS — R339 Retention of urine, unspecified: Secondary | ICD-10-CM | POA: Diagnosis not present

## 2019-09-02 DIAGNOSIS — R8281 Pyuria: Secondary | ICD-10-CM | POA: Diagnosis not present

## 2019-09-02 LAB — MICROSCOPIC EXAMINATION

## 2019-09-02 LAB — URINALYSIS, COMPLETE
Bilirubin, UA: NEGATIVE
Glucose, UA: NEGATIVE
Ketones, UA: NEGATIVE
Nitrite, UA: POSITIVE — AB
Specific Gravity, UA: 1.025 (ref 1.005–1.030)
Urobilinogen, Ur: 0.2 mg/dL (ref 0.2–1.0)
pH, UA: 7 (ref 5.0–7.5)

## 2019-09-03 ENCOUNTER — Encounter: Payer: Self-pay | Admitting: Urology

## 2019-09-03 MED ORDER — ONDANSETRON HCL 4 MG PO TABS: 4.0000 mg | ORAL_TABLET | Freq: Three times a day (TID) | ORAL | 0 refills | Status: DC | PRN

## 2019-09-03 NOTE — Progress Notes (Signed)
09/02/2019 7:17 AM   Lauren Roman Oct 22, 1928 [id number]  Referring provider: Danella Penton, MD [phone] [facility] [address] [facility] [place],  [place] [address]  Chief Complaint  Patient presents with  . Follow-up    HPI: Lauren Roman is an 86 y.o. female admitted admitted 08/14/2019 after a fall sustaining an impacted right humeral neck fracture which was treated nonoperatively.  During her hospitalization she developed urinary retention.  She had been on solifenacin for overactive bladder which was discontinued.  A voiding trial was attempted however the Foley catheter was reinserted when residual was >300 mL.  She was initially going to be discharged to an SNF however her daughter elected to care for her at home with [facility].  She is currently not ambulatory.  Her daughter states she has been weak and has intermittent nausea.  She has not had PC follow-up since discharge.  She states she is scheduled to see orthopedics next week.  Her daughter states PT is coming daily however she has not been strong enough to be transferred to a wheelchair.  She was treated for a UTI last week with cefuroxime.   PMH: Past Medical History:  Diagnosis Date  . Arthritis   . Dementia (HCC)    mild  . Headache    migraines  . Hepatitis 1952   hep b-no problems since then  . Hyperlipidemia   . Myasthenia gravis (HCC)   . Tremor    right hand    Surgical History: Past Surgical History:  Procedure Laterality Date  . COLONOSCOPY    . INTRAMEDULLARY (IM) NAIL INTERTROCHANTERIC Left 09/20/2018   Procedure: INTRAMEDULLARY (IM) NAIL INTERTROCHANTRIC;  Surgeon: Kennedy Bucker, MD;  Location: [facility] ORS;  Service: Orthopedics;  Laterality: Left;  . KYPHOPLASTY N/A 04/08/2019   Procedure: T9 KYPHOPLASTY;  Surgeon: Kennedy Bucker, MD;  Location: [facility] ORS;  Service: Orthopedics;  Laterality: N/A;  . LUMBAR LAMINECTOMY/DECOMPRESSION MICRODISCECTOMY N/A 01/30/2018   Procedure: LUMBAR LAMINECTOMY/DECOMPRESSION MICRODISCECTOMY 1 LEVEL-L4-5 FAR LATERAL DISCECTOMY;  Surgeon: Venetia Night, MD;  Location: [facility] ORS;  Service: Neurosurgery;  Laterality: N/A;    Home Medications:  Allergies as of 09/02/2019      Reactions   Exelon [rivastigmine Tartrate]    Patient unsure of reaction   Hydrocodone-acetaminophen Nausea Only   Sometimes make her nausea    Other Itching, Rash   Anything metal on skin will cause rash and itching      Medication List       Accurate as of September 02, 2019 11:59 PM. If you have any questions, ask your nurse or doctor.        acetaminophen 650 MG CR tablet Commonly known as: TYLENOL Take 650 mg by mouth as needed for pain.   Align 4 MG Caps Take 4 mg by mouth daily.   apixaban 5 MG Tabs tablet Commonly known as: Eliquis Take 2 tablets (10mg ) twice daily for 7 days, then 1 tablet (5mg ) twice daily   aspirin EC 81 MG tablet Take 81 mg by mouth daily.   cefUROXime 250 MG tablet Commonly known as: CEFTIN Take by mouth.   fluticasone 50 MCG/ACT nasal spray Commonly known as: FLONASE Place 1 spray into both nostrils daily.   gabapentin 100 MG capsule Commonly known as: NEURONTIN Take 100-200 mg by mouth See admin instructions. Take 1 capsule (100mg ) by mouth every morning and take 2 capsules (200mg ) by mouth every evening   magnesium oxide 400 MG tablet Commonly known  as: MAG-OX Take 400 mg by mouth daily.   omeprazole 40 MG capsule Commonly known as: PRILOSEC Take 40 mg by mouth daily.   oxyCODONE 5 MG immediate release tablet Commonly known as: Oxy IR/ROXICODONE Take 1 tablet (5 mg total) by mouth every 6 (six) hours as needed for severe pain. Must last 30 days. Start taking on: Sep 24, 2019   polyethylene glycol 17 g packet Commonly known as: MIRALAX / GLYCOLAX Take 17 g by mouth daily.   predniSONE 5 MG tablet Commonly known as: DELTASONE Take 5 mg by mouth daily.   senna-docusate 8.6-50 MG  tablet Commonly known as: Senokot-S Take 1 tablet by mouth 2 (two) times daily.   SYSTANE COMPLETE OP Place 1 drop into both eyes daily.   vitamin B-12 1000 MCG tablet Commonly known as: CYANOCOBALAMIN Take 1,000 mcg by mouth daily.   Vitamin D-1000 Max St 25 MCG (1000 UT) tablet Generic drug: Cholecalciferol Take 1,000 Units by mouth daily.   vitamin k 100 MCG tablet Take 100 mcg by mouth daily.       Allergies:  Allergies  Allergen Reactions  . Exelon [Rivastigmine Tartrate]     Patient unsure of reaction  . Hydrocodone-Acetaminophen Nausea Only    Sometimes make her nausea   . Other Itching and Rash    Anything metal on skin will cause rash and itching    Family History: No family history on file.  Social History:  reports that she quit smoking about 56 years ago. She has never used smokeless tobacco. She reports that she does not drink alcohol or use drugs.   Physical Exam: BP (!) 147/67   Pulse 79   Ht 5' (1.524 m)   Wt 110 lb (49.9 kg)   BMI 21.48 kg/m   Constitutional: Somnolent, No acute distress. HEENT: [place] AT, moist mucus membranes.  Trachea midline, no masses. Cardiovascular: No clubbing, cyanosis, or edema. Respiratory: Normal respiratory effort, no increased work of breathing. GI: Abdomen is soft, nontender, nondistended, no abdominal masses GU: Foley catheter draining clear urine   Assessment & Plan:    - Urinary retention She is currently not ambulatory and is confined to the bed.  Would not recommend voiding trial at this point until she is at least able to be transferred to a wheelchair.  Urinalysis performed today shows mild pyuria and a repeat urine culture was ordered though she is presently asymptomatic and would not recommend treatment unless she became symptomatic.  Once she is more mobile we will give an order to home health for a voiding trial.  Her daughter did request a prescription for nausea and Rx Zofran was sent to pharmacy.  She  also requested prescribing her something to improve her appetite and I recommended follow-up with her PCP.   Abbie Sons, [place] [address], [place], [place] [address] [phone]

## 2019-09-09 ENCOUNTER — Ambulatory Visit: Payer: Self-pay | Admitting: Urology

## 2019-09-09 ENCOUNTER — Telehealth: Payer: Self-pay | Admitting: *Deleted

## 2019-09-09 DIAGNOSIS — E440 Moderate protein-calorie malnutrition: Secondary | ICD-10-CM | POA: Insufficient documentation

## 2019-09-09 LAB — CULTURE, URINE COMPREHENSIVE

## 2019-09-09 NOTE — Telephone Encounter (Signed)
-----   Message from Riki Altes, MD sent at 09/09/2019  2:37 PM EDT ----- Urine culture did grow bacteria however this is very common with an indwelling catheter and would not recommend treatment unless she is having UTI symptoms

## 2019-09-09 NOTE — Telephone Encounter (Signed)
Notified patient daughter  as instructed, patient pleased. Discussed follow-up appointments, patient agrees  

## 2019-09-12 ENCOUNTER — Other Ambulatory Visit: Payer: Self-pay

## 2019-09-12 ENCOUNTER — Ambulatory Visit (INDEPENDENT_AMBULATORY_CARE_PROVIDER_SITE_OTHER): Payer: Medicare Other | Admitting: Family Medicine

## 2019-09-12 ENCOUNTER — Telehealth: Payer: Self-pay | Admitting: Urology

## 2019-09-12 DIAGNOSIS — R339 Retention of urine, unspecified: Secondary | ICD-10-CM | POA: Diagnosis not present

## 2019-09-12 NOTE — Telephone Encounter (Signed)
Spoke to patient's daughter and it is time for her to have her monthly catheter change. Per Dr. Heywood Footman note patient is to continue catheter use until she is able to move to a wheelchair. Daughter will have EMS bring her today for a catheter change.

## 2019-09-12 NOTE — Progress Notes (Signed)
Patient came in office today by EMS to have her catheter checked. Her daughter states it was leaking. The balloon to the catheter was not inflated.   Cath Change/ Replacement  Patient is present today for a catheter change due to urinary retention.  55ml of water was removed from the balloon,  a 16FR foley cath was removed with out difficulty.  Patient was cleaned and prepped in a sterile fashion with betadine. A 16 FR foley cath was replaced into the bladder no complications were noted Urine return was noted 98ml and urine was cloudy/yellow in color. The balloon was filled with 19ml of sterile water. A night bag was attached for drainage. Patient was given proper instruction on catheter care.    Performed by: Teressa Lower, CMA  Follow up: 1 month catheter change, patient's daughter is to call.

## 2019-09-12 NOTE — Telephone Encounter (Signed)
Patient called stating that her mother's catheter is leaking really bad and wants to know what to do. She would like a call back.  Marcelino Duster

## 2019-09-15 ENCOUNTER — Telehealth: Payer: Self-pay | Admitting: Urology

## 2019-09-15 NOTE — Telephone Encounter (Signed)
Patient's daughter called and stated that her mother's catheter was leaking again. After speaking with Lyla Son and Carollee Herter patient is taking the myrbetriq  She was given. She is having bladder spasm, They suggested that her home health nurse come out today to see what's going on with her because the patient is bed bound right now. She would have to travel via EMS. I advised her daughter that if they were not able to make it out today to call the office back ASAP to let us know so that we could get her on the schedule ASAP to come in due to the EMS arrangements Etc. I asked that she not wait until late afternoon if at all possible so that we could avoid her not being able to get here because of transportation issues. She was in agreement.  Marcelino Duster

## 2019-09-16 ENCOUNTER — Other Ambulatory Visit: Payer: Self-pay | Admitting: Urology

## 2019-09-18 ENCOUNTER — Telehealth: Payer: Self-pay | Admitting: Urology

## 2019-09-18 NOTE — Telephone Encounter (Signed)
Patient's daughter, Shelda Pal, called again today and [facility] that catheter is still leaking.  Elease Hashimoto, home health nurse came out and tried some things, but it is still leaking.  She would like for someone to call her A.S.A.P.

## 2019-09-18 NOTE — Telephone Encounter (Signed)
Is home health or the daughter willing to straight cath her?  If so, we can take the catheter out and straight cath her three times a day.

## 2019-09-18 NOTE — Telephone Encounter (Signed)
Spoke to cathy and her mom is still having what seems to be bladder spasms. She has been using the Myrbetriq 25mg  samples we gave her and she is still leaking around the catheter. Her home health nurse came out today and tried to adjust the catheter but it didn't help. Is there a order or recommendation you suggest for the patient. She is non ambulatory and will have to get EMS to bring her here.

## 2019-09-19 NOTE — Telephone Encounter (Signed)
Unable to leave message, Voicemail is full.

## 2019-09-22 NOTE — Telephone Encounter (Signed)
Second attempt, unable to leave message voicemail is full.

## 2019-09-22 NOTE — Telephone Encounter (Signed)
[name], Shelda Pal, returned your call and said we have been calling the incorrect number.  The phone number to call is [phone]

## 2019-09-22 NOTE — Telephone Encounter (Signed)
3rd attempt to reach patient's daughter. I have been unable to leave a message as the voice mail is full.

## 2019-10-04 ENCOUNTER — Other Ambulatory Visit: Payer: Self-pay

## 2019-10-04 ENCOUNTER — Emergency Department
Admission: EM | Admit: 2019-10-04 | Discharge: 2019-10-04 | Disposition: A | Discharge status: Home / Self Care | Payer: Medicare Other | Attending: Emergency Medicine | Admitting: Emergency Medicine

## 2019-10-04 ENCOUNTER — Encounter: Payer: Self-pay | Admitting: Psychiatry

## 2019-10-04 DIAGNOSIS — Z87891 Personal history of nicotine dependence: Secondary | ICD-10-CM | POA: Insufficient documentation

## 2019-10-04 DIAGNOSIS — F0390 Unspecified dementia without behavioral disturbance: Secondary | ICD-10-CM | POA: Insufficient documentation

## 2019-10-04 DIAGNOSIS — Z79899 Other long term (current) drug therapy: Secondary | ICD-10-CM | POA: Insufficient documentation

## 2019-10-04 DIAGNOSIS — Z7901 Long term (current) use of anticoagulants: Secondary | ICD-10-CM | POA: Insufficient documentation

## 2019-10-04 DIAGNOSIS — Z7982 Long term (current) use of aspirin: Secondary | ICD-10-CM | POA: Diagnosis not present

## 2019-10-04 DIAGNOSIS — R339 Retention of urine, unspecified: Secondary | ICD-10-CM | POA: Diagnosis present

## 2019-10-04 NOTE — ED Provider Notes (Signed)
[facility] Emergency Department Provider Note  Time seen: 1:48 PM  I have reviewed the triage vital signs and the nursing notes.   HISTORY  Chief Complaint Urinary Retention   HPI Lauren Roman is a 86 y.o. female with a past medical history of dementia, presents to emergency department for leaking Foley catheter.  According to report daughter states the patient's catheter is leaking and possibly obstructed.  Here the patient appears well, she has no complaints, but states she does not recall why she is here.  Has a baseline history of dementia.   Past Medical History:  Diagnosis Date  . Arthritis   . Dementia (HCC)    mild  . Headache    migraines  . Hepatitis 1952   hep b-no problems since then  . Hyperlipidemia   . Myasthenia gravis (HCC)   . Tremor    right hand    Patient Active Problem List   Diagnosis Date Noted  . Closed fracture of right proximal humerus 08/25/2019  . Goals of care, counseling/discussion   . Palliative care by specialist   . DNR (do not resuscitate)   . Fall at home 08/14/2019  . Pressure injury of skin 12/28/2018  . UTI (urinary tract infection) 12/27/2018  . Acute deep vein thrombosis (DVT) of femoral vein of left lower extremity (HCC) 12/10/2018  . HAP (hospital-acquired pneumonia) 11/01/2018  . Closed fracture of left hip (HCC) 09/19/2018  . DDD (degenerative disc disease), lumbar 06/20/2018  . Lumbar radiculopathy 01/30/2018  . Adult idiopathic generalized osteoporosis 06/15/2016  . Chronic osteoarthritis 06/15/2016  . B12 deficiency 07/21/2014    Past Surgical History:  Procedure Laterality Date  . COLONOSCOPY    . INTRAMEDULLARY (IM) NAIL INTERTROCHANTERIC Left 09/20/2018   Procedure: INTRAMEDULLARY (IM) NAIL INTERTROCHANTRIC;  Surgeon: Kennedy Bucker, MD;  Location: [facility] ORS;  Service: Orthopedics;  Laterality: Left;  . KYPHOPLASTY N/A 04/08/2019   Procedure: T9 KYPHOPLASTY;  Surgeon: Kennedy Bucker, MD;   Location: [facility] ORS;  Service: Orthopedics;  Laterality: N/A;  . LUMBAR LAMINECTOMY/DECOMPRESSION MICRODISCECTOMY N/A 01/30/2018   Procedure: LUMBAR LAMINECTOMY/DECOMPRESSION MICRODISCECTOMY 1 LEVEL-L4-5 FAR LATERAL DISCECTOMY;  Surgeon: Venetia Night, MD;  Location: [facility] ORS;  Service: Neurosurgery;  Laterality: N/A;    Prior to Admission medications   Medication Sig Start Date End Date Taking? Authorizing Provider  acetaminophen (TYLENOL) 650 MG CR tablet Take 650 mg by mouth as needed for pain.    [provider]  apixaban (ELIQUIS) 5 MG TABS tablet Take 2 tablets (10mg ) twice daily for 7 days, then 1 tablet (5mg ) twice daily 08/19/19   , MD  aspirin EC 81 MG tablet Take 81 mg by mouth daily.    [provider]  Cholecalciferol (VITAMIN D-1000 MAX ST) 1000 units tablet Take 1,000 Units by mouth daily.    [provider]  fluticasone (FLONASE) 50 MCG/ACT nasal spray Place 1 spray into both nostrils daily.    [provider]  gabapentin (NEURONTIN) 100 MG capsule Take 100-200 mg by mouth See admin instructions. Take 1 capsule (100mg ) by mouth every morning and take 2 capsules (200mg ) by mouth every evening    [provider]  magnesium oxide (MAG-OX) 400 MG tablet Take 400 mg by mouth daily.    [provider]  omeprazole (PRILOSEC) 40 MG capsule Take 40 mg by mouth daily. 09/16/18 09/16/19  [provider]  ondansetron (ZOFRAN) 4 MG tablet TAKE 1 TABLET BY MOUTH EVERY 8 HOURS AS  NEEDED FOR NAUSEA 09/16/19   Stoioff, Verna Czech, MD  oxyCODONE (OXY IR/ROXICODONE) 5 MG immediate release tablet Take 1 tablet (5 mg total) by mouth every 6 (six) hours as needed for severe pain. Must last 30 days. 09/24/19 10/24/19  Edward Jolly, MD  polyethylene glycol (MIRALAX / GLYCOLAX) 17 g packet Take 17 g by mouth daily. 08/20/19   Tyrone Nine, MD  predniSONE (DELTASONE) 5 MG tablet Take 5 mg by mouth daily. 09/05/18   [provider]   Probiotic Product (ALIGN) 4 MG CAPS Take 4 mg by mouth daily.    [provider]  Propylene Glycol (SYSTANE COMPLETE OP) Place 1 drop into both eyes daily.    [provider]  senna-docusate (SENOKOT-S) 8.6-50 MG tablet Take 1 tablet by mouth 2 (two) times daily. 08/19/19   Tyrone Nine, MD  vitamin B-12 (CYANOCOBALAMIN) 1000 MCG tablet Take 1,000 mcg by mouth daily.    [provider]  vitamin k 100 MCG tablet Take 100 mcg by mouth daily.    [provider]    Allergies  Allergen Reactions  . Exelon [Rivastigmine Tartrate]     Patient unsure of reaction  . Hydrocodone-Acetaminophen Nausea Only    Sometimes make her nausea   . Other Itching and Rash    Anything metal on skin will cause rash and itching    No family history on file.  Social History Social History   Tobacco Use  . Smoking status: Former Smoker    Quit date: 01/30/1963    Years since quitting: 56.7  . Smokeless tobacco: Never Used  . Tobacco comment: only social smoking and not even a full year  Substance Use Topics  . Alcohol use: Never  . Drug use: Never    Review of Systems Unable to obtain adequate/accurate review of systems secondary to dementia ____________________________________________   PHYSICAL EXAM:  VITAL SIGNS: ED Triage Vitals  Enc Vitals Group     BP --      Pulse --      Resp --      Temp 10/04/19 1313 98.6 F (37 C)     Temp Source 10/04/19 1313 Oral     SpO2 10/04/19 1310 97 %     Weight 10/04/19 1314 110 lb 0.2 oz (49.9 kg)     Height 10/04/19 1314 5' (1.524 m)     Head Circumference --      Peak Flow --      Pain Score 10/04/19 1314 0     Pain Loc --      Pain Edu? --      Excl. in GC? --    Constitutional: Alert, no distress.  Calm. Eyes: Normal exam ENT      Head: Normocephalic and atraumatic.      Mouth/Throat: Mucous membranes are moist. Cardiovascular: Normal rate, regular rhythm. Respiratory: Normal respiratory effort without  tachypnea nor retractions. Breath sounds are clear  Gastrointestinal: Soft, nontender abdomen.  No rebound guarding or distention. Musculoskeletal: Nontender with normal range of motion in all extremities.  Neurologic:  Normal speech and language. No gross focal neurologic deficits Skin:  Skin is warm, dry and intact.  Psychiatric: Mood and affect are normal.    INITIAL IMPRESSION / ASSESSMENT AND PLAN / ED COURSE  Pertinent labs & imaging results that were available during my care of the patient were reviewed by me and considered in my medical decision making (see chart for details).   Patient  presents emergency department for Foley catheter issue possible retention/possible leaking.  Currently awaiting daughter for further details as the patient has dementia and cannot contribute to her history.  Overall the patient appears well calm, benign abdominal exam.  We will likely replace the catheter however awaiting daughter for further history.  I spoke to the patient's daughter who states the catheter has been leaking.  Patient is mostly bedbound with help at home due to a shoulder fracture recently.  She would like the catheter replaced.  We will remove the catheter and insert a new catheter ensure that it is functioning properly and likely discharge the patient home.  Patient and daughter agreeable to this plan.  Patient's catheter has been exchanged with good urine output.  We will discharge patient home.  Daughter agreeable to plan of care.  VERYL ABRIL was evaluated in Emergency Department on 10/04/2019 for the symptoms described in the history of present illness. She was evaluated in the context of the global COVID-19 pandemic, which necessitated consideration that the patient might be at risk for infection with the SARS-CoV-2 virus that causes COVID-19. Institutional protocols and algorithms that pertain to the evaluation of patients at risk for COVID-19 are in a state of rapid change  based on information released by regulatory bodies including the CDC and federal and state organizations. These policies and algorithms were followed during the patient's care in the ED.  ____________________________________________   FINAL CLINICAL IMPRESSION(S) / ED DIAGNOSES  Urinary retention   Harvest Dark, MD 10/04/19 1443

## 2019-10-04 NOTE — ED Triage Notes (Signed)
Patient came in via EMS due to foley catheter leakage. Per EMS it has been leaking for a couple weeks. Patient denies pain. VSS NAD noted Patient is hard of hearing

## 2019-10-04 NOTE — ED Notes (Signed)
Attempted to flush foley cath - unable to flush, foley stopped up.

## 2019-10-06 ENCOUNTER — Telehealth: Payer: Self-pay | Admitting: Family Medicine

## 2019-10-06 NOTE — Telephone Encounter (Signed)
Lauren Roman called stating patient needs more samples of Myrbetriq. The medication has been sent to the pharmacy by Dr. Hyacinth Meeker her PCP but they are waiting on PA approval. I put samples up front for her to pick up. She also states she wants me to call [employer] [facility] to get approval for her to have the system at home. I informed her that we don't have any notes that patient has urinary incontinence. The notes will need to be updated with documentation of medical necessity for the [facility] [place] system. Lauren Roman voiced understanding and will call our office if she needs to schedule a appointment.

## 2019-10-09 ENCOUNTER — Telehealth: Payer: Self-pay | Admitting: Adult Health Nurse Practitioner

## 2019-10-09 NOTE — Telephone Encounter (Signed)
Called patient's daughter's home number and someone else answered and said she wasn't there and would be back in about an hour, told him that I would call back either later or tomorrow.  I then called daughter's cell number and spoke with her and explained why I was calling and she said that the Physical Therapist was coming to see the patient in a few minutes to do their discharge visit and she wanted to call me back later or in the morning.  Gave daughter my name and contact number

## 2019-10-11 ENCOUNTER — Other Ambulatory Visit: Payer: Self-pay

## 2019-10-11 ENCOUNTER — Encounter: Payer: Self-pay | Admitting: Emergency Medicine

## 2019-10-11 ENCOUNTER — Emergency Department
Admission: EM | Admit: 2019-10-11 | Discharge: 2019-10-11 | Disposition: A | Discharge status: Home / Self Care | Payer: Medicare Other | Attending: Emergency Medicine | Admitting: Emergency Medicine

## 2019-10-11 DIAGNOSIS — F0390 Unspecified dementia without behavioral disturbance: Secondary | ICD-10-CM | POA: Diagnosis not present

## 2019-10-11 DIAGNOSIS — Z960 Presence of urogenital implants: Secondary | ICD-10-CM | POA: Insufficient documentation

## 2019-10-11 DIAGNOSIS — Z79899 Other long term (current) drug therapy: Secondary | ICD-10-CM | POA: Insufficient documentation

## 2019-10-11 DIAGNOSIS — Z87891 Personal history of nicotine dependence: Secondary | ICD-10-CM | POA: Diagnosis not present

## 2019-10-11 DIAGNOSIS — T83011A Breakdown (mechanical) of indwelling urethral catheter, initial encounter: Secondary | ICD-10-CM | POA: Diagnosis not present

## 2019-10-11 DIAGNOSIS — Z7982 Long term (current) use of aspirin: Secondary | ICD-10-CM | POA: Diagnosis not present

## 2019-10-11 DIAGNOSIS — Z7901 Long term (current) use of anticoagulants: Secondary | ICD-10-CM | POA: Insufficient documentation

## 2019-10-11 DIAGNOSIS — T83031A Leakage of indwelling urethral catheter, initial encounter: Secondary | ICD-10-CM | POA: Diagnosis present

## 2019-10-11 LAB — URINALYSIS, COMPLETE (UACMP) WITH MICROSCOPIC
Bilirubin Urine: NEGATIVE
Glucose, UA: NEGATIVE mg/dL
Ketones, ur: NEGATIVE mg/dL
Nitrite: NEGATIVE
Protein, ur: NEGATIVE mg/dL
RBC / HPF: >50 RBC/hpf — ABNORMAL HIGH (ref 0–5)
Specific Gravity, Urine: 1.009 (ref 1.005–1.030)
WBC, UA: >50 WBC/hpf — ABNORMAL HIGH (ref 0–5)
pH: 7 (ref 5.0–8.0)

## 2019-10-11 NOTE — ED Notes (Signed)
Patient given water at this time.  

## 2019-10-11 NOTE — ED Notes (Signed)
ED Provider at bedside. 

## 2019-10-11 NOTE — ED Provider Notes (Signed)
-----------------------------------------   7:10 AM on 10/11/2019 -----------------------------------------  Blood pressure 118/66, pulse 92, temperature 97.8 F (36.6 C), temperature source Oral, resp. rate 18, height 5' (1.524 m), weight 48.1 kg, SpO2 100 %.  Assuming care from Dr. Don Perking.  In short, Lauren Roman is a 86 y.o. female with a chief complaint of catheter problems .  Refer to the original H&P for additional details.  The current plan of care is to determine if patient able to pass trial of void, if she continues to retain urine will need to replace foley catheter.  ----------------------------------------- 8:14 AM on 10/11/2019 -----------------------------------------  Patient was able to void without difficulty and afterwards had post void residual measurements ranging from 44 to 88 cc of urine.  She denies any abdominal Discomfort at this time and it seems reasonable to withhold replacement of Foley catheter.  Daughter was counseled to have patient follow-up closely with urology and to return to the ED for any signs or symptoms of urinary retention.  Patient and daughter agree with plan.    Chesley Noon, MD 10/11/19 [phone]

## 2019-10-11 NOTE — ED Provider Notes (Signed)
[facility] Emergency Department Provider Note  ____________________________________________  Time seen: Approximately 5:58 AM  I have reviewed the triage vital signs and the nursing notes.   HISTORY  Chief Complaint catheter problems   HPI Lauren Roman is a 86 y.o. female with a history of dementia and a urinary retention who presents from home for a leaking Foley catheter.  Patient has developed urinary retention 8 weeks ago when she sustained a fall with a humeral head fracture.  Since then has had an indwelling Foley catheter which was last exchanged 7 days ago.  This evening patient was complaining of severe burning around the catheter and daughter noticed that the catheter was leaking.  Patient is currently on antibiotics for UTI.  She reports that the burning has now resolved.  No abdominal pain, no nausea, no flank pain, no vomiting or fever.  Past Medical History:  Diagnosis Date  . Arthritis   . Dementia (HCC)    mild  . Headache    migraines  . Hepatitis 1952   hep b-no problems since then  . Hyperlipidemia   . Myasthenia gravis (HCC)   . Tremor    right hand    Patient Active Problem List   Diagnosis Date Noted  . Closed fracture of right proximal humerus 08/25/2019  . Goals of care, counseling/discussion   . Palliative care by specialist   . DNR (do not resuscitate)   . Fall at home 08/14/2019  . Pressure injury of skin 12/28/2018  . UTI (urinary tract infection) 12/27/2018  . Acute deep vein thrombosis (DVT) of femoral vein of left lower extremity (HCC) 12/10/2018  . HAP (hospital-acquired pneumonia) 11/01/2018  . Closed fracture of left hip (HCC) 09/19/2018  . DDD (degenerative disc disease), lumbar 06/20/2018  . Lumbar radiculopathy 01/30/2018  . Adult idiopathic generalized osteoporosis 06/15/2016  . Chronic osteoarthritis 06/15/2016  . B12 deficiency 07/21/2014    Past Surgical History:  Procedure Laterality Date  .  COLONOSCOPY    . INTRAMEDULLARY (IM) NAIL INTERTROCHANTERIC Left 09/20/2018   Procedure: INTRAMEDULLARY (IM) NAIL INTERTROCHANTRIC;  Surgeon: Kennedy Bucker, MD;  Location: [facility] ORS;  Service: Orthopedics;  Laterality: Left;  . KYPHOPLASTY N/A 04/08/2019   Procedure: T9 KYPHOPLASTY;  Surgeon: Kennedy Bucker, MD;  Location: [facility] ORS;  Service: Orthopedics;  Laterality: N/A;  . LUMBAR LAMINECTOMY/DECOMPRESSION MICRODISCECTOMY N/A 01/30/2018   Procedure: LUMBAR LAMINECTOMY/DECOMPRESSION MICRODISCECTOMY 1 LEVEL-L4-5 FAR LATERAL DISCECTOMY;  Surgeon: Venetia Night, MD;  Location: [facility] ORS;  Service: Neurosurgery;  Laterality: N/A;    Prior to Admission medications   Medication Sig Start Date End Date Taking? Authorizing Provider  acetaminophen (TYLENOL) 650 MG CR tablet Take 650 mg by mouth as needed for pain.    [provider]  apixaban (ELIQUIS) 5 MG TABS tablet Take 2 tablets (10mg ) twice daily for 7 days, then 1 tablet (5mg ) twice daily 08/19/19   , MD  aspirin EC 81 MG tablet Take 81 mg by mouth daily.    [provider]  Cholecalciferol (VITAMIN D-1000 MAX ST) 1000 units tablet Take 1,000 Units by mouth daily.    [provider]  fluticasone (FLONASE) 50 MCG/ACT nasal spray Place 1 spray into both nostrils daily.    [provider]  gabapentin (NEURONTIN) 100 MG capsule Take 100-200 mg by mouth See admin instructions. Take 1 capsule (100mg ) by mouth every morning and take 2 capsules (200mg ) by mouth every evening    [provider]  magnesium  oxide (MAG-OX) 400 MG tablet Take 400 mg by mouth daily.    [provider]  omeprazole (PRILOSEC) 40 MG capsule Take 40 mg by mouth daily. 09/16/18 09/16/19  [provider]  ondansetron (ZOFRAN) 4 MG tablet TAKE 1 TABLET BY MOUTH EVERY 8 HOURS AS NEEDED FOR NAUSEA 09/16/19   Stoioff, Verna Czech, MD  oxyCODONE (OXY IR/ROXICODONE) 5 MG immediate release tablet Take 1 tablet (5 mg total) by  mouth every 6 (six) hours as needed for severe pain. Must last 30 days. 09/24/19 10/24/19  Edward Jolly, MD  polyethylene glycol (MIRALAX / GLYCOLAX) 17 g packet Take 17 g by mouth daily. 08/20/19   Tyrone Nine, MD  predniSONE (DELTASONE) 5 MG tablet Take 5 mg by mouth daily. 09/05/18   [provider]  Probiotic Product (ALIGN) 4 MG CAPS Take 4 mg by mouth daily.    [provider]  Propylene Glycol (SYSTANE COMPLETE OP) Place 1 drop into both eyes daily.    [provider]  senna-docusate (SENOKOT-S) 8.6-50 MG tablet Take 1 tablet by mouth 2 (two) times daily. 08/19/19   Tyrone Nine, MD  vitamin B-12 (CYANOCOBALAMIN) 1000 MCG tablet Take 1,000 mcg by mouth daily.    [provider]  vitamin k 100 MCG tablet Take 100 mcg by mouth daily.    [provider]    Allergies Exelon [rivastigmine tartrate], Hydrocodone-acetaminophen, and Other  No family history on file.  Social History Social History   Tobacco Use  . Smoking status: Former Smoker    Quit date: 01/30/1963    Years since quitting: 56.7  . Smokeless tobacco: Never Used  . Tobacco comment: only social smoking and not even a full year  Substance Use Topics  . Alcohol use: Never  . Drug use: Never    Review of Systems  Constitutional: Negative for fever. Eyes: Negative for visual changes. ENT: Negative for sore throat. Neck: No neck pain  Cardiovascular: Negative for chest pain. Respiratory: Negative for shortness of breath. Gastrointestinal: Negative for abdominal pain, vomiting or diarrhea. Genitourinary: + dysuria. Musculoskeletal: Negative for back pain. Skin: Negative for rash. Neurological: Negative for headaches, weakness or numbness. Psych: No SI or HI  ____________________________________________   PHYSICAL EXAM:  VITAL SIGNS: ED Triage Vitals [10/11/19 0553]  Enc Vitals Group     BP 115/64     Pulse Rate 95     Resp 18     Temp 97.8 F (36.6 C)     Temp  Source Oral     SpO2 100 %     Weight 106 lb (48.1 kg)     Height 5' (1.524 m)     Head Circumference      Peak Flow      Pain Score 0     Pain Loc      Pain Edu?      Excl. in GC?     Constitutional: Alert and oriented. Well appearing and in no apparent distress. HEENT:      Head: Normocephalic and atraumatic.         Eyes: Conjunctivae are normal. Sclera is non-icteric.       Mouth/Throat: Mucous membranes are moist.       Neck: Supple with no signs of meningismus. Cardiovascular: Regular rate and rhythm.  Respiratory: Normal respiratory effort. Lungs are clear to auscultation bilaterally.  Gastrointestinal: Soft, non tender, and non distended with positive bowel sounds. No rebound or guarding. Genitourinary: No CVA tenderness.  Foley draining yellow urine with sediment Musculoskeletal: No edema, cyanosis, or erythema of extremities. Neurologic: Normal speech and language. Face is symmetric. Moving all extremities. No gross focal neurologic deficits are appreciated. Skin: Skin is warm, dry and intact. No rash noted. Psychiatric: Mood and affect are normal. Speech and behavior are normal.  ____________________________________________   LABS (all labs ordered are listed, but only abnormal results are displayed)  Labs Reviewed  URINALYSIS, COMPLETE (UACMP) WITH MICROSCOPIC - Abnormal; Notable for the following components:      Result Value   Color, Urine YELLOW (*)    APPearance CLOUDY (*)    Hgb urine dipstick MODERATE (*)    Leukocytes,Ua LARGE (*)    RBC / HPF >50 (*)    WBC, UA >50 (*)    Bacteria, UA RARE (*)    Non Squamous Epithelial PRESENT (*)    All other components within normal limits  URINE CULTURE   ____________________________________________  EKG  none  ____________________________________________  RADIOLOGY  none  ____________________________________________   PROCEDURES  Procedure(s) performed: None Procedures Critical Care performed:   None ____________________________________________   INITIAL IMPRESSION / ASSESSMENT AND PLAN / ED COURSE  86 y.o. female with a history of dementia and a urinary retention who presents from home for a leaking Foley catheter and dysuria.  Patient has had the catheter for 8 weeks since breaking her humerus.  She has failed 1 trial of void.  Catheter was last exchanged a week ago.  Daughter is requesting the catheter be removed for another trial of void.  We will attempt that here in the emergency room.  Patient had some burning around the catheter earlier this evening but right now and has no burning.  She does have some sediment in the urine in the Foley catheter.  She is currently on a cephalosporin and nitrofurantoin for her UTI.  No systemic symptoms.  We will send a urine for a repeat culture and attempt a trial of void.  History gathered from patient and daughter who was at bedside.  Old medical records reviewed.      _____________________________________________ Please note:  Patient was evaluated in Emergency Department today for the symptoms described in the history of present illness. Patient was evaluated in the context of the global COVID-19 pandemic, which necessitated consideration that the patient might be at risk for infection with the SARS-CoV-2 virus that causes COVID-19. Institutional protocols and algorithms that pertain to the evaluation of patients at risk for COVID-19 are in a state of rapid change based on information released by regulatory bodies including the CDC and federal and state organizations. These policies and algorithms were followed during the patient's care in the ED.  Some ED evaluations and interventions may be delayed as a result of limited staffing during the pandemic.   [place] Controlled Substance Database was reviewed by me. ____________________________________________   FINAL CLINICAL IMPRESSION(S) / ED DIAGNOSES   Final diagnoses:  Malfunction of Foley  catheter, initial encounter ([place])      NEW MEDICATIONS STARTED DURING THIS VISIT:  ED Discharge Orders    None       Note:  This document was prepared using Dragon voice recognition software and may include unintentional dictation errors.    Alfred Levins, [place], MD 10/11/19 [phone]

## 2019-10-11 NOTE — ED Triage Notes (Signed)
Patient brought in by ems from home. Patient reports that foley catheter started leaking during the night.

## 2019-10-11 NOTE — ED Notes (Signed)
Foley catheter removed per order and purwick placed on patient.

## 2019-10-11 NOTE — ED Notes (Signed)
Bladder scan reads 61mL. Dr. Larinda Buttery, MD made aware.

## 2019-10-12 LAB — URINE CULTURE: Culture: 20000 — AB

## 2019-10-14 ENCOUNTER — Encounter: Payer: Self-pay | Admitting: Physician Assistant

## 2019-10-14 ENCOUNTER — Other Ambulatory Visit: Payer: Self-pay

## 2019-10-14 ENCOUNTER — Ambulatory Visit (INDEPENDENT_AMBULATORY_CARE_PROVIDER_SITE_OTHER): Payer: Medicare Other | Admitting: Physician Assistant

## 2019-10-14 VITALS — BP 120/70 | HR 60

## 2019-10-14 DIAGNOSIS — Z87898 Personal history of other specified conditions: Secondary | ICD-10-CM | POA: Diagnosis not present

## 2019-10-14 DIAGNOSIS — F03A0 Unspecified dementia, mild, without behavioral disturbance, psychotic disturbance, mood disturbance, and anxiety: Secondary | ICD-10-CM | POA: Insufficient documentation

## 2019-10-14 DIAGNOSIS — Z8619 Personal history of other infectious and parasitic diseases: Secondary | ICD-10-CM | POA: Insufficient documentation

## 2019-10-14 DIAGNOSIS — F0390 Unspecified dementia without behavioral disturbance: Secondary | ICD-10-CM | POA: Insufficient documentation

## 2019-10-14 LAB — BLADDER SCAN AMB NON-IMAGING

## 2019-10-14 NOTE — Patient Instructions (Addendum)
Acute Urinary Retention, Female  Acute urinary retention is a condition in which a person is unable to pass urine. This can last for a short time or for a long time. If left untreated, it can result in kidney damage or other serious complications. What are the causes? This condition may be caused by:  Obstruction or narrowing of the tube that drains the bladder (urethra). This may be caused by surgery or problems with nearby organs, which can press or squeeze the urethra.  Problems with the nerves in the bladder. These can be caused by diseases, such as multiple sclerosis, or by spinal cord injuries.  Certain medicines.  Tumors in the area of the pelvis, bladder, or urethra.  Degenerative cognitive conditions such as delirium or dementia.  Diabetes.  Vaginal childbirth.  Bladder or urinary tract infection.  Constipation.  Blood in the urine (hematuria).  Injury to the bladder or urethra.  Psychological (psychogenic) conditions. Someone may hold her urine due to trauma or because she does not want to use the bathroom. What are the signs or symptoms? Symptoms of this condition include:  Trouble urinating.  Pain in the lower abdomen. How is this diagnosed? This condition is diagnosed based on a physical exam and a medical history. You may also have other tests, including:  An ultrasound of the bladder or kidneys or both.  Blood tests.  A urine analysis.  Additional tests may be needed such as an MRI, kidney, or bladder function tests. How is this treated? Treatment for this condition may include:  Medicines.  Placing a thin, sterile tube (catheter) into the bladder to drain urine out of the body. This is called an indwelling urinary catheter. After being inserted, the catheter is held in place with a small balloon that is filled with sterile water. Urine drains from the catheter into a collection bag outside of the body.  Behavioral therapy.  Treatment for any  underlying conditions.  If needed, you may be treated in the hospital for kidney function problems or to manage other complications. Follow these instructions at home:  Take over-the-counter and prescription medicines only as told by your health care provider. Avoid certain medicines, such as decongestants, antihistamines, and some prescription medicines. Do not take any medicine unless your health care provider has approved.  If you were given an indwelling urinary catheter, take care of it as told by your health care provider.  Drink enough fluid to keep your urine clear or pale yellow.  If you were prescribed an antibiotic, take it as told by your health care provider. Do not stop taking the antibiotic even if you start to feel better.  Do not use any products that contain nicotine or tobacco, such as cigarettes and e-cigarettes. If you need help quitting, ask your health care provider.  Monitor any changes in your symptoms. Tell your health care provider about any changes.  If instructed, monitor your blood pressure at home. Report changes as told by your health care provider.  Keep all follow-up visits as told by your health care provider. This is important. Contact a health care provider if:  You have uncomfortable bladder contractions that you cannot control (spasms) or you leak urine with the spasms. Get help right away if:  You have chills or fever.  You have blood in your urine.  You have a catheter and: ? Your catheter stops draining urine. ? Your catheter falls out. Summary  Acute urinary retention is a condition in which a person   is unable to pass urine. If left untreated, it can result in kidney damage or other serious complications.  This condition may be caused by surgery or problems with nearby organs, which can press or squeeze the urethra.  Treatment may include medicines and placement of an indwelling urinary catheter.  Monitor any changes in your symptoms.  Tell your health care provider about any changes. This information is not intended to replace advice given to you by your health care provider. Make sure you discuss any questions you have with your health care provider. Document Revised: 04/13/2017 Document Reviewed: 06/02/2016 Elsevier Patient Education  2020 Elsevier Inc.  Suprapubic Catheter Home Guide A suprapubic catheter is a flexible tube that is used to drain urine from the bladder into a collection bag outside the body. The catheter is inserted into the bladder through a small opening in the lower abdomen, above the pubic bone (suprapubic area) and a few inches below your belly button (navel). A tiny balloon filled with germ-free (sterile) water helps to keep the catheter in place. Document Revised: 08/22/2018 Document Reviewed: 06/05/2018 Elsevier Patient Education  2020 [employer].

## 2019-10-14 NOTE — Progress Notes (Signed)
10/14/2019 6:37 PM   Lauren Roman 01-Oct-1928 [id number]  CC: Chief Complaint  Patient presents with  . Hospitalization Follow-up    HPI: Lauren Roman is a 86 y.o. female with PMH dementia and a recent history of urinary retention during hospitalization after a fall in which she sustained an impacted right humeral neck fracture which was treated nonoperatively.  She failed an inpatient voiding trial and was discharged home into the care of her daughter with Foley catheter in place.  She was noted to have significant bladder spasms and leakage around her catheter tubing with this and was started on Myrbetriq 50 mg daily for management of this.  Patient presented to the ED over the weekend with repeat concerns of urinary leakage around the catheter.  Foley catheter was discontinued at the time and she passed a brief voiding trial in the ED.  Patient presents today with her daughter, who contributes to HPI.  Patient's daughter reports that she has noted decreased urinary output over the last day and is concerned her mother may be going back into urinary retention.  Patient has been using a pure wick device at home for urinary management and has noted to also have leakage into diapers and under pads despite this.  Patient and daughter both expressed that they would like to avoid recatheterization if possible.  Patient denies lower abdominal pain or difficulty urinating today. PVR .  PMH: Past Medical History:  Diagnosis Date  . Arthritis   . Dementia (HCC)    mild  . Headache    migraines  . Hepatitis 1952   hep b-no problems since then  . Hyperlipidemia   . Myasthenia gravis (HCC)   . Tremor    right hand    Surgical History: Past Surgical History:  Procedure Laterality Date  . COLONOSCOPY    . INTRAMEDULLARY (IM) NAIL INTERTROCHANTERIC Left 09/20/2018   Procedure: INTRAMEDULLARY (IM) NAIL INTERTROCHANTRIC;  Surgeon: Kennedy Bucker, MD;  Location: [facility] ORS;  Service:  Orthopedics;  Laterality: Left;  . KYPHOPLASTY N/A 04/08/2019   Procedure: T9 KYPHOPLASTY;  Surgeon: Kennedy Bucker, MD;  Location: [facility] ORS;  Service: Orthopedics;  Laterality: N/A;  . LUMBAR LAMINECTOMY/DECOMPRESSION MICRODISCECTOMY N/A 01/30/2018   Procedure: LUMBAR LAMINECTOMY/DECOMPRESSION MICRODISCECTOMY 1 LEVEL-L4-5 FAR LATERAL DISCECTOMY;  Surgeon: Venetia Night, MD;  Location: [facility] ORS;  Service: Neurosurgery;  Laterality: N/A;    Home Medications:  Allergies as of 10/14/2019      Reactions   Exelon [rivastigmine Tartrate]    Patient unsure of reaction   Hydrocodone-acetaminophen Nausea Only   Sometimes make her nausea    Other Itching, Rash   Anything metal on skin will cause rash and itching      Medication List       Accurate as of October 14, 2019  6:37 PM. If you have any questions, ask your nurse or doctor.        STOP taking these medications   mirabegron ER 50 MG Tb24 tablet Commonly known as: MYRBETRIQ Stopped by: Carman Ching, PA-C     TAKE these medications   acetaminophen 650 MG CR tablet Commonly known as: TYLENOL Take 650 mg by mouth as needed for pain.   Align 4 MG Caps Take 4 mg by mouth daily.   apixaban 5 MG Tabs tablet Commonly known as: Eliquis Take 2 tablets (10mg ) twice daily for 7 days, then 1 tablet (5mg ) twice daily   aspirin EC 81 MG tablet Take 81 mg by mouth daily.  cefUROXime 250 MG tablet Commonly known as: CEFTIN Take 250 mg by mouth 2 (two) times daily.   fluticasone 50 MCG/ACT nasal spray Commonly known as: FLONASE Place 1 spray into both nostrils daily.   gabapentin 100 MG capsule Commonly known as: NEURONTIN Take 100-200 mg by mouth See admin instructions. Take 1 capsule (100mg ) by mouth every morning and take 2 capsules (200mg ) by mouth every evening   magnesium oxide 400 MG tablet Commonly known as: MAG-OX Take 400 mg by mouth daily.   megestrol 40 MG tablet Commonly known as: MEGACE Take 40 mg by  mouth daily.   nitrofurantoin (macrocrystal-monohydrate) 100 MG capsule Commonly known as: MACROBID Take 100 mg by mouth 2 (two) times daily.   omeprazole 40 MG capsule Commonly known as: PRILOSEC Take 40 mg by mouth daily.   ondansetron 4 MG disintegrating tablet Commonly known as: ZOFRAN-ODT 4 mg every 8 (eight) hours as needed.   ondansetron 4 MG tablet Commonly known as: ZOFRAN TAKE 1 TABLET BY MOUTH EVERY 8 HOURS AS NEEDED FOR NAUSEA   oxyCODONE 5 MG immediate release tablet Commonly known as: Oxy IR/ROXICODONE Take 1 tablet (5 mg total) by mouth every 6 (six) hours as needed for severe pain. Must last 30 days.   polyethylene glycol 17 g packet Commonly known as: MIRALAX / GLYCOLAX Take 17 g by mouth daily.   predniSONE 5 MG tablet Commonly known as: DELTASONE Take 5 mg by mouth daily.   senna-docusate 8.6-50 MG tablet Commonly known as: Senokot-S Take 1 tablet by mouth 2 (two) times daily.   SYSTANE COMPLETE OP Place 1 drop into both eyes daily.   vitamin B-12 1000 MCG tablet Commonly known as: CYANOCOBALAMIN Take 1,000 mcg by mouth daily.   Vitamin D-1000 Max St 25 MCG (1000 UT) tablet Generic drug: Cholecalciferol Take 1,000 Units by mouth daily.   vitamin k 100 MCG tablet Take 100 mcg by mouth daily.       Allergies:  Allergies  Allergen Reactions  . Exelon [Rivastigmine Tartrate]     Patient unsure of reaction  . Hydrocodone-Acetaminophen Nausea Only    Sometimes make her nausea   . Other Itching and Rash    Anything metal on skin will cause rash and itching    Family History: No family history on file.  Social History:   reports that she quit smoking about 56 years ago. She has never used smokeless tobacco. She reports that she does not drink alcohol or use drugs.  Physical Exam: BP 120/70 (BP Location: Left Arm, Patient Position: Supine, Cuff Size: Normal)   Pulse 60   Constitutional:  Alert, no acute distress, nontoxic  appearing HEENT: [place], AT Cardiovascular: No clubbing, cyanosis, or edema Respiratory: Normal respiratory effort, no increased work of breathing Skin: No rashes, bruises or suspicious lesions Neurologic: Grossly intact, no focal deficits, moving all 4 extremities Psychiatric: Normal mood and affect  Laboratory Data: Results for orders placed or performed in visit on 10/14/19  Bladder Scan (Post Void Residual) in office  Result Value Ref Range   Scan Result 08-21-1978    Assessment & Plan:   1. History of urinary retention 86 year old female with a recent history of urinary retention during hospitalization following a fall presents for evaluation of possible urinary retention with decreased urinary output and to home pure wick container.  PVR WNL today.  I explained to the patient and her daughter today that she is not back in urinary retention, however I would like to  watch her closely to ensure that this does not recur.  We will plan for repeat PVR in 1 week.  I counseled the patient's daughter on signs of recurrent urinary retention including decreased urinary output without leakage into surrounding absorbent materials, urinary frequency, lower abdominal pain, and abdominal distention.  Additionally, I counseled the patient to discontinue Myrbetriq due to concerns for contribution to urinary retention.  If patient does develop recurrent urinary retention, I recommend consideration of a suprapubic catheter as she poorly tolerated urethral Foley. - Bladder Scan (Post Void Residual) in office  Return in about 1 week (around 10/21/2019) for Repeat PVR.  Debroah Loop, PA-C  [facility] [address] [place], [place] [address] [phone]

## 2019-10-20 ENCOUNTER — Telehealth: Payer: Self-pay | Admitting: Adult Health Nurse Practitioner

## 2019-10-20 NOTE — Telephone Encounter (Signed)
Rec'd call back from patient's daughter, Genella Mech, and after discussing Palliative services with her and all questions were answered she was in agreement with this.  I have scheduled an In-person Consult for 10/22/19 @ 1:30 PM

## 2019-10-21 ENCOUNTER — Other Ambulatory Visit: Payer: Self-pay | Admitting: Internal Medicine

## 2019-10-21 ENCOUNTER — Encounter: Payer: Self-pay | Admitting: Physician Assistant

## 2019-10-21 ENCOUNTER — Ambulatory Visit: Payer: Medicare Other | Admitting: Physician Assistant

## 2019-10-21 DIAGNOSIS — I639 Cerebral infarction, unspecified: Secondary | ICD-10-CM

## 2019-10-22 ENCOUNTER — Emergency Department
Admission: EM | Admit: 2019-10-22 | Discharge: 2019-10-24 | Disposition: A | Discharge status: Nursing Home (Includes ICF) | Payer: Medicare Other | Attending: Emergency Medicine | Admitting: Emergency Medicine

## 2019-10-22 ENCOUNTER — Telehealth: Payer: Self-pay | Admitting: Physician Assistant

## 2019-10-22 ENCOUNTER — Other Ambulatory Visit: Payer: Self-pay

## 2019-10-22 ENCOUNTER — Ambulatory Visit
Payer: Medicare Other | Attending: Student in an Organized Health Care Education/Training Program | Admitting: Student in an Organized Health Care Education/Training Program

## 2019-10-22 ENCOUNTER — Emergency Department: Payer: Medicare Other

## 2019-10-22 ENCOUNTER — Encounter: Payer: Self-pay | Admitting: Student in an Organized Health Care Education/Training Program

## 2019-10-22 ENCOUNTER — Other Ambulatory Visit: Payer: Medicare Other | Admitting: Adult Health Nurse Practitioner

## 2019-10-22 DIAGNOSIS — Z8781 Personal history of (healed) traumatic fracture: Secondary | ICD-10-CM | POA: Diagnosis not present

## 2019-10-22 DIAGNOSIS — S72002S Fracture of unspecified part of neck of left femur, sequela: Secondary | ICD-10-CM | POA: Diagnosis not present

## 2019-10-22 DIAGNOSIS — N3000 Acute cystitis without hematuria: Secondary | ICD-10-CM | POA: Insufficient documentation

## 2019-10-22 DIAGNOSIS — F0390 Unspecified dementia without behavioral disturbance: Secondary | ICD-10-CM | POA: Insufficient documentation

## 2019-10-22 DIAGNOSIS — M48062 Spinal stenosis, lumbar region with neurogenic claudication: Secondary | ICD-10-CM

## 2019-10-22 DIAGNOSIS — N3090 Cystitis, unspecified without hematuria: Secondary | ICD-10-CM

## 2019-10-22 DIAGNOSIS — G7000 Myasthenia gravis without (acute) exacerbation: Secondary | ICD-10-CM | POA: Diagnosis not present

## 2019-10-22 DIAGNOSIS — M5416 Radiculopathy, lumbar region: Secondary | ICD-10-CM

## 2019-10-22 DIAGNOSIS — S42201S Unspecified fracture of upper end of right humerus, sequela: Secondary | ICD-10-CM

## 2019-10-22 DIAGNOSIS — G8929 Other chronic pain: Secondary | ICD-10-CM

## 2019-10-22 DIAGNOSIS — M1990 Unspecified osteoarthritis, unspecified site: Secondary | ICD-10-CM

## 2019-10-22 DIAGNOSIS — R519 Headache, unspecified: Secondary | ICD-10-CM | POA: Insufficient documentation

## 2019-10-22 DIAGNOSIS — G894 Chronic pain syndrome: Secondary | ICD-10-CM

## 2019-10-22 DIAGNOSIS — Z9889 Other specified postprocedural states: Secondary | ICD-10-CM

## 2019-10-22 DIAGNOSIS — E538 Deficiency of other specified B group vitamins: Secondary | ICD-10-CM

## 2019-10-22 DIAGNOSIS — Z515 Encounter for palliative care: Secondary | ICD-10-CM

## 2019-10-22 DIAGNOSIS — M545 Low back pain: Secondary | ICD-10-CM | POA: Diagnosis present

## 2019-10-22 DIAGNOSIS — N390 Urinary tract infection, site not specified: Secondary | ICD-10-CM

## 2019-10-22 LAB — COMPREHENSIVE METABOLIC PANEL
ALT: 11 U/L (ref 0–44)
AST: 15 U/L (ref 15–41)
Albumin: 3.1 g/dL — ABNORMAL LOW (ref 3.5–5.0)
Alkaline Phosphatase: 44 U/L (ref 38–126)
Anion gap: 8 (ref 5–15)
BUN: 25 mg/dL — ABNORMAL HIGH (ref 8–23)
CO2: 22 mmol/L (ref 22–32)
Calcium: 8.8 mg/dL — ABNORMAL LOW (ref 8.9–10.3)
Chloride: 109 mmol/L (ref 98–111)
Creatinine, Ser: 0.68 mg/dL (ref 0.44–1.00)
GFR calc Af Amer: >60 mL/min (ref >60)
GFR calc non Af Amer: >60 mL/min (ref >60)
Glucose, Bld: 83 mg/dL (ref 70–99)
Potassium: 4.1 mmol/L (ref 3.5–5.1)
Sodium: 139 mmol/L (ref 135–145)
Total Bilirubin: 0.7 mg/dL (ref 0.3–1.2)
Total Protein: 6.5 g/dL (ref 6.5–8.1)

## 2019-10-22 LAB — CBC WITH DIFFERENTIAL/PLATELET
Abs Immature Granulocytes: 0.07 10*3/uL (ref 0.00–0.07)
Basophils Absolute: 0.1 10*3/uL (ref 0.0–0.1)
Basophils Relative: 1 %
Eosinophils Absolute: 0.3 10*3/uL (ref 0.0–0.5)
Eosinophils Relative: 4 %
HCT: 33.7 % — ABNORMAL LOW (ref 36.0–46.0)
Hemoglobin: 11.4 g/dL — ABNORMAL LOW (ref 12.0–15.0)
Immature Granulocytes: 1 %
Lymphocytes Relative: 20 %
Lymphs Abs: 1.6 10*3/uL (ref 0.7–4.0)
MCH: 30.4 pg (ref 26.0–34.0)
MCHC: 33.8 g/dL (ref 30.0–36.0)
MCV: 89.9 fL (ref 80.0–100.0)
Monocytes Absolute: 1 10*3/uL (ref 0.1–1.0)
Monocytes Relative: 12 %
Neutro Abs: 4.9 10*3/uL (ref 1.7–7.7)
Neutrophils Relative %: 62 %
Platelets: 231 10*3/uL (ref 150–400)
RBC: 3.75 MIL/uL — ABNORMAL LOW (ref 3.87–5.11)
RDW: 14.5 % (ref 11.5–15.5)
WBC: 7.8 10*3/uL (ref 4.0–10.5)
nRBC: 0 % (ref 0.0–0.2)

## 2019-10-22 LAB — URINALYSIS, COMPLETE (UACMP) WITH MICROSCOPIC
Bilirubin Urine: NEGATIVE
Glucose, UA: NEGATIVE mg/dL
Ketones, ur: NEGATIVE mg/dL
Nitrite: NEGATIVE
Protein, ur: NEGATIVE mg/dL
Specific Gravity, Urine: 1.021 (ref 1.005–1.030)
WBC, UA: >50 WBC/hpf — ABNORMAL HIGH (ref 0–5)
pH: 6 (ref 5.0–8.0)

## 2019-10-22 MED ORDER — OXYCODONE HCL 5 MG PO TABS: 5.0000 mg | ORAL_TABLET | Freq: Four times a day (QID) | ORAL | 0 refills | Status: AC | PRN

## 2019-10-22 MED ORDER — MEGESTROL ACETATE 20 MG PO TABS
40.0000 mg | ORAL_TABLET | Freq: Every day | ORAL | Status: DC
  Administered 2019-10-23 – 2019-10-24 (×2): 40 mg via ORAL
  Filled 2019-10-22 (×2): qty 2

## 2019-10-22 MED ORDER — OXYCODONE HCL 5 MG PO TABS
5.0000 mg | ORAL_TABLET | Freq: Four times a day (QID) | ORAL | Status: DC | PRN
  Filled 2019-10-22: qty 1

## 2019-10-22 MED ORDER — VITAMIN K 100 MCG PO TABS: 100.0000 ug | ORAL_TABLET | Freq: Every day | ORAL | Status: DC

## 2019-10-22 MED ORDER — PANTOPRAZOLE SODIUM 40 MG PO TBEC
40.0000 mg | DELAYED_RELEASE_TABLET | Freq: Every day | ORAL | Status: DC
  Administered 2019-10-23 – 2019-10-24 (×2): 40 mg via ORAL
  Filled 2019-10-22 (×2): qty 1

## 2019-10-22 MED ORDER — OXYCODONE HCL 5 MG PO TABS
5.0000 mg | ORAL_TABLET | Freq: Four times a day (QID) | ORAL | Status: DC | PRN
  Administered 2019-10-22 – 2019-10-24 (×3): 5 mg via ORAL
  Filled 2019-10-22 (×2): qty 1

## 2019-10-22 MED ORDER — ACETAMINOPHEN 325 MG PO TABS
650.0000 mg | ORAL_TABLET | Freq: Four times a day (QID) | ORAL | Status: DC | PRN
  Administered 2019-10-23: 650 mg via ORAL
  Filled 2019-10-22: qty 2

## 2019-10-22 MED ORDER — FLUCONAZOLE 100 MG PO TABS
200.0000 mg | ORAL_TABLET | Freq: Every day | ORAL | Status: DC
  Administered 2019-10-22 – 2019-10-24 (×3): 200 mg via ORAL
  Filled 2019-10-22 (×3): qty 2

## 2019-10-22 MED ORDER — ACETAMINOPHEN ER 650 MG PO TBCR: 650.0000 mg | EXTENDED_RELEASE_TABLET | ORAL | Status: DC | PRN

## 2019-10-22 MED ORDER — ONDANSETRON HCL 4 MG PO TABS: 4.0000 mg | ORAL_TABLET | Freq: Three times a day (TID) | ORAL | Status: DC | PRN

## 2019-10-22 MED ORDER — RISAQUAD PO CAPS
1.0000 | ORAL_CAPSULE | Freq: Every day | ORAL | Status: DC
  Administered 2019-10-23 – 2019-10-24 (×2): 1 via ORAL
  Filled 2019-10-22 (×2): qty 1

## 2019-10-22 MED ORDER — SODIUM CHLORIDE 0.9 % IV SOLN
1.0000 g | Freq: Once | INTRAVENOUS | Status: AC
  Administered 2019-10-22: 1 g via INTRAVENOUS
  Filled 2019-10-22: qty 10

## 2019-10-22 MED ORDER — OXYCODONE HCL 5 MG PO TABS: 5.0000 mg | ORAL_TABLET | Freq: Four times a day (QID) | ORAL | 0 refills | Status: DC | PRN

## 2019-10-22 MED ORDER — GABAPENTIN 100 MG PO CAPS: 100.0000 mg | ORAL_CAPSULE | ORAL | Status: DC

## 2019-10-22 MED ORDER — VITAMIN D 25 MCG (1000 UNIT) PO TABS
1000.0000 [IU] | ORAL_TABLET | Freq: Every day | ORAL | Status: DC
  Administered 2019-10-23 – 2019-10-24 (×2): 1000 [IU] via ORAL
  Filled 2019-10-22 (×2): qty 1

## 2019-10-22 MED ORDER — SENNOSIDES-DOCUSATE SODIUM 8.6-50 MG PO TABS
1.0000 | ORAL_TABLET | Freq: Two times a day (BID) | ORAL | Status: DC
  Administered 2019-10-23 – 2019-10-24 (×4): 1 via ORAL
  Filled 2019-10-22 (×4): qty 1

## 2019-10-22 MED ORDER — APIXABAN 5 MG PO TABS
5.0000 mg | ORAL_TABLET | Freq: Two times a day (BID) | ORAL | Status: DC
  Administered 2019-10-23 – 2019-10-24 (×4): 5 mg via ORAL
  Filled 2019-10-22 (×4): qty 1

## 2019-10-22 MED ORDER — VITAMIN B-12 1000 MCG PO TABS
1000.0000 ug | ORAL_TABLET | Freq: Every day | ORAL | Status: DC
  Administered 2019-10-23 – 2019-10-24 (×2): 1000 ug via ORAL
  Filled 2019-10-22 (×2): qty 1

## 2019-10-22 MED ORDER — MAGNESIUM OXIDE 400 (241.3 MG) MG PO TABS
400.0000 mg | ORAL_TABLET | Freq: Every day | ORAL | Status: DC
  Administered 2019-10-23 – 2019-10-24 (×2): 400 mg via ORAL
  Filled 2019-10-22 (×3): qty 1

## 2019-10-22 NOTE — ED Provider Notes (Signed)
ER Provider Note       Time seen: 4:46 PM   Level V caveat: History/ROS limited by dementia I have reviewed the vital signs and the nursing notes.  HISTORY   Chief Complaint Urinary Tract Infection    HPI Lauren Roman is a 86 y.o. female with a history of arthritis, dementia, headache, hepatitis, hyperlipidemia, myasthenia gravis who presents today for persistent UTI.  According to EMS she arrives from home with 20 days of UTI.  She was seen by a nurse practitioner today and sent to the ER for further evaluation.  She denies any complaints, does have low back pain.  Past Medical History:  Diagnosis Date  . Arthritis   . Dementia ([place])    mild  . Headache    migraines  . Hepatitis 1952   hep b-no problems since then  . Hyperlipidemia   . Myasthenia gravis ([place])   . Tremor    right hand    Past Surgical History:  Procedure Laterality Date  . COLONOSCOPY    . INTRAMEDULLARY (IM) NAIL INTERTROCHANTERIC Left 09/20/2018   Procedure: INTRAMEDULLARY (IM) NAIL INTERTROCHANTRIC;  Surgeon: Hessie Knows, MD;  Location: [facility];  Service: Orthopedics;  Laterality: Left;  . KYPHOPLASTY N/A 04/08/2019   Procedure: T9 KYPHOPLASTY;  Surgeon: Hessie Knows, MD;  Location: [facility];  Service: Orthopedics;  Laterality: N/A;  . LUMBAR LAMINECTOMY/DECOMPRESSION MICRODISCECTOMY N/A 01/30/2018   Procedure: LUMBAR LAMINECTOMY/DECOMPRESSION MICRODISCECTOMY 1 LEVEL-L4-5 FAR LATERAL DISCECTOMY;  Surgeon: Meade Maw, MD;  Location: [facility];  Service: Neurosurgery;  Laterality: N/A;    Allergies Exelon [rivastigmine tartrate], Hydrocodone-acetaminophen, and Other  Review of Systems Constitutional: Negative for fever. Cardiovascular: Negative for chest pain. Respiratory: Negative for shortness of breath. Gastrointestinal: Negative for abdominal pain, vomiting and diarrhea. Musculoskeletal: Positive for back pain Skin: Negative for rash. Review of systems otherwise unknown All  systems negative/normal/unremarkable except as stated in the HPI  ____________________________________________   PHYSICAL EXAM:  VITAL SIGNS: Vitals:   10/22/19 1632 10/22/19 1633  BP:  134/70  Pulse:  88  Resp:  16  Temp:  99.7 F (37.6 C)  SpO2: 98% 98%    Constitutional: Alert but disoriented, well appearing and in no distress. Eyes: Conjunctivae are normal. Normal extraocular movements. Cardiovascular: Normal rate, regular rhythm. No murmurs, rubs, or gallops. Respiratory: Normal respiratory effort without tachypnea nor retractions. Breath sounds are clear and equal bilaterally. No wheezes/rales/rhonchi. Gastrointestinal: Soft and nontender. Normal bowel sounds Musculoskeletal: Nontender with normal range of motion in extremities. No lower extremity tenderness nor edema. Neurologic:  Normal speech and language. No gross focal neurologic deficits are appreciated.  Skin:  Skin is warm, dry and intact. No rash noted. Psychiatric: Speech and behavior are normal.  ___________________________________________   LABS (pertinent positives/negatives)  Labs Reviewed  CBC WITH DIFFERENTIAL/PLATELET - Abnormal; Notable for the following components:      Result Value   RBC 3.75 (*)    Hemoglobin 11.4 (*)    HCT 33.7 (*)    All other components within normal limits  COMPREHENSIVE METABOLIC PANEL - Abnormal; Notable for the following components:   BUN 25 (*)    Calcium 8.8 (*)    Albumin 3.1 (*)    All other components within normal limits  URINALYSIS, COMPLETE (UACMP) WITH MICROSCOPIC - Abnormal; Notable for the following components:   Color, Urine YELLOW (*)    APPearance CLOUDY (*)    Hgb urine dipstick LARGE (*)    Leukocytes,Ua LARGE (*)  WBC, UA >50 (*)    Bacteria, UA RARE (*)    All other components within normal limits  URINE CULTURE    RADIOLOGY  Images were viewed by me CT renal protocol  DIFFERENTIAL DIAGNOSIS  UTI, pyelonephritis, sepsis, dehydration,  electrolyte abnormality  ASSESSMENT AND PLAN  Cystitis   Plan: The patient had presented for recurrent UTIs. Patient's labs did indicate a urinary tract infection, 10 days ago this grew out yeast.  She was given IV Rocephin as well as oral Diflucan.  Overall this should be amenable to treatment as an outpatient.  Family is uncomfortable with her going back home, they have requested social work evaluation.  Daryel November MD    Note: This note was generated in part or whole with voice recognition software. Voice recognition is usually quite accurate but there are transcription errors that can and very often do occur. I apologize for any typographical errors that were not detected and corrected.     Emily Filbert, MD 10/22/19 [phone]

## 2019-10-22 NOTE — Progress Notes (Signed)
Patient: Lauren Roman  Service Category: E/M  Provider: Gillis Santa, MD  DOB: 04-Jul-1928  DOS: 10/22/2019  Location: Office  MRN: [id number]  Setting: Ambulatory outpatient  Referring Provider: Rusty Aus, MD  Type: Established Patient  Specialty: Interventional Pain Management  PCP: Rusty Aus, MD  Location: Home  Delivery: TeleHealth     Virtual Encounter - Pain Management PROVIDER NOTE: Information contained herein reflects review and annotations entered in association with encounter. Interpretation of such information and data should be left to medically-trained personnel. Information provided to patient can be located elsewhere in the medical record under "Patient Instructions". Document created using STT-dictation technology, any transcriptional errors that may result from process are unintentional.    Contact & Pharmacy Preferred: [phone] Home: [phone] (home) Mobile: [phone] (mobile) E-mail: [email or web address] .[place], [place] - [address] [place] [address] Phone: [phone] Fax: [phone]   Pre-screening  [name] offered "in-person" vs "virtual" encounter. She indicated preferring virtual for this encounter.   Reason COVID-19*   Social distancing based on CDC and AMA recommendations.   I contacted Lauren Roman on 10/22/2019 via Televisit.      I clearly identified myself as Gillis Santa, MD. I verified that I was speaking with the correct person using two identifiers (Name: NIYATI HEINKE, and date of birth: 10-05-1928).  Consent I sought verbal advanced consent from Lauren Roman for virtual visit interactions. I informed Ms. Mccauley of possible security and privacy concerns, risks, and limitations associated with providing "not-in-person" medical evaluation and management services. I also informed Ms. Pryde of the availability of "in-person" appointments. Finally, I informed her that there would be a  charge for the virtual visit and that she could be  personally, fully or partially, financially responsible for it. Ms. Looman expressed understanding and agreed to proceed.   Historic Elements   [name] is a 86 y.o. year old, female patient evaluated today after her last contact with our practice on 08/27/2019. [name]  has a past medical history of Arthritis, Dementia ([place]), Headache, Hepatitis (1952), Hyperlipidemia, Myasthenia gravis ([place]), and Tremor. She also  has a past surgical history that includes Colonoscopy; Lumbar laminectomy/decompression microdiscectomy (N/A, 01/30/2018); Intramedullary (im) nail intertrochanteric (Left, 09/20/2018); and Kyphoplasty (N/A, 04/08/2019). [name] has a current medication list which includes the following prescription(s): acetaminophen, apixaban, aspirin ec, cefuroxime, cholecalciferol, fluticasone, gabapentin, magnesium oxide, megestrol, nitrofurantoin (macrocrystal-monohydrate), omeprazole, ondansetron, ondansetron, oxycodone, [START ON 11/21/2019] oxycodone, polyethylene glycol, prednisone, align, propylene glycol, senna-docusate, vitamin b-12, and vitamin k. She  reports that she quit smoking about 56 years ago. She has never used smokeless tobacco. She reports that she does not drink alcohol or use drugs. [name] is allergic to exelon [rivastigmine tartrate]; hydrocodone-acetaminophen; and other.   HPI  Today, she is being contacted for medication management.   I spoke to the patient's daughter today, Juliann Pulse who has been taking care of her mother Lauren Roman.  Unfortunately [name] is not doing well.  She has deteriorated significantly since her last visit.  She has been more sedated and less responsive.  She has had seizures since her last visit as well as a urinary tract infection.  She is currently bedridden at this time.  She has an upcoming brain CT without contrast to evaluate for stroke.  Patient daughter states that she is experiencing  fatigue and burnout.  She states that she has an appointment with  palliative care today.  I encouraged her to speak to them about goals of care as I believe her health status has deteriorated significantly and that patient and family members need to consider hospice/palliative care.  I have very limited to offer the patient.  I will refill her oxycodone as requested by the patient's daughter which [name] is utilizing twice a day to help manage her pain and make her comfortable.  Pharmacotherapy Assessment  Analgesic: 09/27/2019  3   08/25/2019  Oxycodone Hcl 5 MG Tablet  60.00  30 Bi Lat   [id number]   Thr (7106)   0  15.00 MME  Comm Ins   [place]     Monitoring: [place] PMP: PDMP reviewed during this encounter.       Pharmacotherapy: No side-effects or adverse reactions reported. Compliance: No problems identified. Effectiveness: Clinically acceptable. Plan: Refer to "POC".  UDS:  Summary  Date Value Ref Range Status  06/10/2019 Note  Final    Comment:    ==================================================================== Compliance Drug Analysis, Ur ==================================================================== Test                             Result       Flag       Units Drug Present and Declared for Prescription Verification   Oxycodone                      734          EXPECTED   ng/mg creat   Oxymorphone                    1659         EXPECTED   ng/mg creat   Noroxycodone                   2153         EXPECTED   ng/mg creat   Noroxymorphone                 681          EXPECTED   ng/mg creat    Sources of oxycodone are scheduled prescription medications.    Oxymorphone, noroxycodone, and noroxymorphone are expected    metabolites of oxycodone. Oxymorphone is also available as a    scheduled prescription medication.   Acetaminophen                  PRESENT      EXPECTED Drug Absent but Declared for Prescription Verification   Gabapentin                     Not Detected  UNEXPECTED   Salicylate                     Not Detected UNEXPECTED    Aspirin, as indicated in the declared medication list, is not always    detected even when used as directed.   Propranolol                    Not Detected UNEXPECTED ==================================================================== Test                      Result    Flag   Units      Ref Range   Creatinine  32               mg/dL      >=20 ==================================================================== Declared Medications:  The flagging and interpretation on this report are based on the  following declared medications.  Unexpected results may arise from  inaccuracies in the declared medications.  **Note: The testing scope of this panel includes these medications:  Gabapentin  Oxycodone  Propranolol  **Note: The testing scope of this panel does not include small to  moderate amounts of these reported medications:  Acetaminophen  Aspirin  **Note: The testing scope of this panel does not include the  following reported medications:  Cholecalciferol  Cyanocobalamin  Eye Drop  Fluticasone  Hydrochlorothiazide  Magnesium (Mag-Ox)  Nitrofurantoin (Macrobid)  Omeprazole  Prednisone  Probiotic  Rivaroxaban  Solifenacin (Vesicare)  Supplement ==================================================================== For clinical consultation, please call [phone]. ====================================================================     Laboratory Chemistry Profile   Renal Lab Results  Component Value Date   BUN 16 08/18/2019   CREATININE 0.78 08/18/2019   GFRAA >60 08/18/2019   GFRNONAA >60 08/18/2019     Hepatic Lab Results  Component Value Date   AST 32 08/28/2018   ALT 12 08/28/2018   ALBUMIN 4.2 08/28/2018   ALKPHOS 54 08/28/2018   LIPASE 19 08/28/2018     Electrolytes Lab Results  Component Value Date   NA 134 (L) 08/18/2019   K 4.0 08/18/2019   CL 100 08/18/2019    CALCIUM 8.7 (L) 08/18/2019   MG 2.1 12/30/2018     Bone No results found for: VD25OH, VD125OH2TOT, JH4174YC1, KG8185UD1, 25OHVITD1, 25OHVITD2, 25OHVITD3, TESTOFREE, TESTOSTERONE   Inflammation (CRP: Acute Phase) (ESR: Chronic Phase) Lab Results  Component Value Date   ESRSEDRATE 9 06/09/2013       Note: Above Lab results reviewed.   Imaging  US Venous Img Lower Bilateral (DVT) CLINICAL DATA:  Leg swelling and elevated D-dimer.  EXAM: BILATERAL LOWER EXTREMITY VENOUS DOPPLER ULTRASOUND  TECHNIQUE: Gray-scale sonography with graded compression, as well as color Doppler and duplex ultrasound were performed to evaluate the lower extremity deep venous systems from the level of the common femoral vein and including the common femoral, femoral, profunda femoral, popliteal and calf veins including the posterior tibial, peroneal and gastrocnemius veins when visible. The superficial great saphenous vein was also interrogated. Spectral Doppler was utilized to evaluate flow at rest and with distal augmentation maneuvers in the common femoral, femoral and popliteal veins.  COMPARISON:  None.  FINDINGS: RIGHT LOWER EXTREMITY  Common Femoral Vein: No evidence of thrombus. Normal compressibility, respiratory phasicity and response to augmentation.  Saphenofemoral Junction: No evidence of thrombus. Normal compressibility and flow on color Doppler imaging.  Profunda Femoral Vein: No evidence of thrombus. Normal compressibility and flow on color Doppler imaging.  Femoral Vein: No evidence of thrombus. Normal compressibility, respiratory phasicity and response to augmentation.  Popliteal Vein: No evidence of thrombus. Normal compressibility, respiratory phasicity and response to augmentation.  Calf Veins: No evidence of thrombus. Normal compressibility and flow on color Doppler imaging.  Superficial Great Saphenous Vein: No evidence of thrombus.  Normal compressibility.  Other Findings:  None.  LEFT LOWER EXTREMITY  Common Femoral Vein: No evidence of thrombus. Normal compressibility, respiratory phasicity and response to augmentation.  Saphenofemoral Junction: No evidence of thrombus. Normal compressibility and flow on color Doppler imaging.  Profunda Femoral Vein: No evidence of thrombus. Normal compressibility and flow on color Doppler imaging.  Femoral Vein: Nonocclusive thrombus is noted in the proximal left  femoral vein. The remaining portions of the left superficial femoral vein are unremarkable.  Popliteal Vein: No evidence of thrombus. Normal compressibility, respiratory phasicity and response to augmentation.  Calf Veins: No evidence of thrombus. Normal compressibility and flow on color Doppler imaging.  Superficial Great Saphenous Vein: No evidence of thrombus. Normal compressibility.  Other Findings:  None.  IMPRESSION: Study is positive for nonocclusive deep vein thrombosis within the proximal left superficial femoral vein. No DVT identified involving the right lower extremity.  Electronically Signed   By: Constance Holster M.D.   On: 08/17/2019 18:54 DG Chest Port 1 View CLINICAL DATA:  Fever. Recent right humeral fracture.  EXAM: PORTABLE CHEST 1 VIEW  COMPARISON:  April 2nd 2021  FINDINGS: Cardiomediastinal silhouette is enlarged. Mediastinal contours appear intact. Calcific atherosclerotic disease of the aorta  There is no evidence of focal airspace consolidation, pleural effusion or pneumothorax. Chronic coarsening in the interstitial markings.  IMPRESSION: 1. No active disease in the chest. 2. Chronic coarsening in interstitial markings. 3. Calcific atherosclerotic disease of the aorta.  Electronically Signed   By: Fidela Salisbury M.D.   On: 08/17/2019 11:06  Assessment  The primary encounter diagnosis was Lumbar radiculopathy. Diagnoses of Closed fracture of left hip,  sequela, H/O kyphoplasty, History of compression fracture of spine, History of lumbar laminectomy for spinal cord decompression (left L4/5), Chronic osteoarthritis, Chronic radicular lumbar pain, B12 deficiency, Spinal stenosis, lumbar region, with neurogenic claudication, Closed fracture of proximal end of right humerus, unspecified fracture morphology, sequela, and Chronic pain syndrome were also pertinent to this visit.  Plan of Care  Ms. Lauren Roman has a current medication list which includes the following long-term medication(s): apixaban, fluticasone, gabapentin, and omeprazole.  Ms. Northcraft has deteriorated in health since her last visit.  I talked to the patient's daughter, Juliann Pulse today.  Ms. Ventresca has a very poor prognosis.  They have an appointment with palliative care today and I discussed with Juliann Pulse to review goals of care and that they may need palliative care/hospice going forward since Ms. Iversen is not doing well.  In the interim, I will refill her oxycodone as below which she takes twice a day as needed.  I encouraged Juliann Pulse to utilize a stool softener so that Ms. Hulen does not get constipated.  Pharmacotherapy (Medications Ordered): Meds ordered this encounter  Medications   oxyCODONE (OXY IR/ROXICODONE) 5 MG immediate release tablet    Sig: Take 1 tablet (5 mg total) by mouth every 6 (six) hours as needed for severe pain. Must last 30 days.    Dispense:  60 tablet    Refill:  0    Chronic Pain. (STOP Act - Not applicable). Fill one day early if closed on scheduled refill date.   oxyCODONE (OXY IR/ROXICODONE) 5 MG immediate release tablet    Sig: Take 1 tablet (5 mg total) by mouth every 6 (six) hours as needed for severe pain. Must last 30 days.    Dispense:  60 tablet    Refill:  0    Chronic Pain. (STOP Act - Not applicable). Fill one day early if closed on scheduled refill date.    Follow-up plan:   Return in about 8 weeks (around 12/17/2019) for Medication Management,  virtual.   Recent Visits Date Type Provider Dept  08/25/19 Office Visit Gillis Santa, MD [facility]  Showing recent visits within past 90 days and meeting all other requirements   Today's Visits Date Type Provider Dept  10/22/19  Telemedicine Gillis Santa, MD [facility]  Showing today's visits and meeting all other requirements   Future Appointments No visits were found meeting these conditions.  Showing future appointments within next 90 days and meeting all other requirements   I discussed the assessment and treatment plan with the patient. The patient was provided an opportunity to ask questions and all were answered. The patient agreed with the plan and demonstrated an understanding of the instructions.  Patient advised to call back or seek an in-person evaluation if the symptoms or condition worsens.  Duration of encounter: 30 minutes.  Note by: Gillis Santa, MD Date: 10/22/2019; Time: 4:01 PM

## 2019-10-22 NOTE — ED Notes (Signed)
[place]  Scan completed  50 ml noted in bladder.

## 2019-10-22 NOTE — ED Notes (Signed)
Pt daughter came to desk stating that she was leaving for the night. Pt daughter also sts that if pt requests for [place] to call her then that is ok with her no matter the time. RN made aware.

## 2019-10-22 NOTE — Telephone Encounter (Signed)
Patient's daughter, Genella Mech (#[phone]) called the office on 10/21/19 and left a voice mail message requesting a letter from the provider that states that the safest way for the patient to travel is by stretcher.  She needs this to arrange transport for the patient to appointments.  Please advise.  If you agree with this statement, a letter can be drafted for your signature.

## 2019-10-22 NOTE — Progress Notes (Signed)
Lauren Roman Consult Note Telephone: [phone]  Fax: [phone]  PATIENT NAME: Lauren Roman DOB: 02-Dec-1928 MRN: [id number]  PRIMARY CARE PROVIDER:   Rusty Aus, MD  REFERRING PROVIDER:  Rusty Aus, MD Lauren Roman,  Lauren Roman [id number]  RESPONSIBLE PARTY:   Lauren Roman, daughter [phone]    RECOMMENDATIONS and PLAN:  1.  Advanced care planning.  Patient is DNR/DNI  2.  Falls.  In August 2019 started having back pain and found to have bulging discs and had kyphoplasty. Patient had fall with left hip fracture in 2020 requiring surgical repair.  She had fall with right humerus fracture 08/14/19.  When in hospital in April, she had lost a lot of her functional mobility.  Currently at home she requires [facility] lift to be transferred to wheelchair.  She cannot sit up by herself.  Requires total assistance with ADLs.  Daughter does state that at first she was not able to feed herself and was having difficulty swallowing.  Daughter was able to slowly increase her diet from soft liquid to where she is eating more normally now.  She is now able to feed herself.  She can assist with washing her face and brushing her teeth but still requires total assistance with all other ADLs.  Patient does have pain that she states starts in her lower back and will radiate up into her shoulders.  She is being followed by Dr. Zollie Scale at the pain clinic for this.  Daughter states that she feels like she has done all that she can to help her mother would like to see her improve.  She would like assistance with placement in a skilled nursing facility.  3.  Urinary retention.  Patient had some urinary retention in hospital visit in April.  Foley was placed but was removed upon discharge and she uses pure wick system at home.  Daughter does have concerns that she has not had much output today.  Patient has been started on  antibiotics for urinary tract infection yesterday.  Daughter states that patient does drink plenty of fluids.  Usually has 600 to 800 mL of output twice a day.  Daughter states that the last time she emptied the pure wick container was last night as she currently only has 400 mL output over the past 18 hours.  Patient is having some suprapubic distention but patient denies any pain or tenderness.  Denies fever, cough, shortness of breath, nausea, vomiting, diarrhea, constipation.  Concerned about urinary retention and have advised daughter to have patient evaluated at the hospital.  Daughter states that she will call EMS.  Have suggested that while at the hospital inquire about placement in skilled nursing facility as it will be easier for transfer from hospital than it is from home.  Daughter expresses understanding and will ask while at the hospital being evaluated for urinary retention.  Have reached out to hospital liaison's to follow patient while in the hospital.  Whether returning home or being placed in skilled nursing daughter would like palliative services to continue for her mother.  We will reach back out to family once patient is discharged from hospital.  I spent 110 minutes providing this consultation,  from 1:00 to 2:50 including time spent with patient/family, chart review, provider coordination, documentation. More than 50% of the time in this consultation was spent coordinating communication.   HISTORY OF PRESENT ILLNESS:  Lauren Roman  is a 86 y.o. year old female with multiple medical problems including myasthenia gravis, HLD, mild dementia, recurrent falls, recurrent UTIs. Palliative Care was asked to help address goals of care.   CODE STATUS: DNR/DNI  PPS: 30% HOSPICE ELIGIBILITY/DIAGNOSIS: TBD  PHYSICAL EXAM:   General: NAD, frail appearing, thin Cardiovascular: regular rate and rhythm Pulmonary: clear ant fields Abdomen: soft, nontender, + bowel sounds GU:  Patient has  purewick system in place with only 400cc of medium amber urine output in 18 hours; she does have suprapubic distention but denies tenderness Extremities: no edema, no joint deformities Skin: no rashes on exposed skin Neurological: Weakness but otherwise nonfocal   PAST MEDICAL HISTORY:  Past Medical History:  Diagnosis Date   Arthritis    Dementia (HCC)    mild   Headache    migraines   Hepatitis 1952   hep b-no problems since then   Hyperlipidemia    Myasthenia gravis (HCC)    Tremor    right hand    SOCIAL HX:  Social History   Tobacco Use   Smoking status: Former Smoker    Quit date: 01/30/1963    Years since quitting: 56.7   Smokeless tobacco: Never Used   Tobacco comment: only social smoking and not even a full year  Substance Use Topics   Alcohol use: Never    ALLERGIES:  Allergies  Allergen Reactions   Exelon [Rivastigmine Tartrate]     Patient unsure of reaction   Hydrocodone-Acetaminophen Nausea Only    Sometimes make her nausea    Other Itching and Rash    Anything metal on skin will cause rash and itching     PERTINENT MEDICATIONS:  No facility-administered encounter medications on file as of 10/22/2019.   Outpatient Encounter Medications as of 10/22/2019  Medication Sig   acetaminophen (TYLENOL) 650 MG CR tablet Take 650 mg by mouth as needed for pain.   apixaban (ELIQUIS) 5 MG TABS tablet Take 2 tablets (10mg ) twice daily for 7 days, then 1 tablet (5mg ) twice daily   aspirin EC 81 MG tablet Take 81 mg by mouth daily.   cefUROXime (CEFTIN) 250 MG tablet Take 250 mg by mouth 2 (two) times daily.   Cholecalciferol (VITAMIN D-1000 MAX ST) 1000 units tablet Take 1,000 Units by mouth daily.   fluticasone (FLONASE) 50 MCG/ACT nasal spray Place 1 spray into both nostrils daily.   gabapentin (NEURONTIN) 100 MG capsule Take 100-200 mg by mouth See admin instructions. Take 1 capsule (100mg ) by mouth every morning and take 2 capsules (200mg ) by  mouth every evening   magnesium oxide (MAG-OX) 400 MG tablet Take 400 mg by mouth daily.   megestrol (MEGACE) 40 MG tablet Take 40 mg by mouth daily.   nitrofurantoin, macrocrystal-monohydrate, (MACROBID) 100 MG capsule Take 100 mg by mouth 2 (two) times daily.   omeprazole (PRILOSEC) 40 MG capsule Take 40 mg by mouth daily.   ondansetron (ZOFRAN) 4 MG tablet TAKE 1 TABLET BY MOUTH EVERY 8 HOURS AS NEEDED FOR NAUSEA   ondansetron (ZOFRAN-ODT) 4 MG disintegrating tablet 4 mg every 8 (eight) hours as needed.   polyethylene glycol (MIRALAX / GLYCOLAX) 17 g packet Take 17 g by mouth daily.   predniSONE (DELTASONE) 5 MG tablet Take 5 mg by mouth daily.   Probiotic Product (ALIGN) 4 MG CAPS Take 4 mg by mouth daily.   Propylene Glycol (SYSTANE COMPLETE OP) Place 1 drop into both eyes daily.   senna-docusate (SENOKOT-S) 8.6-50 MG tablet  Take 1 tablet by mouth 2 (two) times daily.   vitamin B-12 (CYANOCOBALAMIN) 1000 MCG tablet Take 1,000 mcg by mouth daily.   vitamin k 100 MCG tablet Take 100 mcg by mouth daily.     Vonzell Lindblad Marlena Clipper, NP

## 2019-10-22 NOTE — ED Triage Notes (Signed)
Patient from home, arrives via ems c/o uti x 20 fays. Patient seen at home buy NP and sent to ed for further eval. Patient HOH but able to answer questions appropriately. Denies cp/sob, only complaint is lower back pain.

## 2019-10-23 ENCOUNTER — Emergency Department: Payer: Medicare Other

## 2019-10-23 DIAGNOSIS — N3000 Acute cystitis without hematuria: Secondary | ICD-10-CM | POA: Diagnosis not present

## 2019-10-23 LAB — URINE CULTURE

## 2019-10-23 MED ORDER — GABAPENTIN 100 MG PO CAPS
100.0000 mg | ORAL_CAPSULE | Freq: Every day | ORAL | Status: DC
  Administered 2019-10-23 – 2019-10-24 (×2): 100 mg via ORAL
  Filled 2019-10-23 (×2): qty 1

## 2019-10-23 MED ORDER — GABAPENTIN 100 MG PO CAPS
200.0000 mg | ORAL_CAPSULE | Freq: Every day | ORAL | Status: DC
  Administered 2019-10-23 (×2): 200 mg via ORAL
  Filled 2019-10-23 (×4): qty 2

## 2019-10-23 MED ORDER — POLYETHYLENE GLYCOL 3350 17 G PO PACK
17.0000 g | PACK | Freq: Every day | ORAL | Status: DC
  Administered 2019-10-23 – 2019-10-24 (×2): 17 g via ORAL
  Filled 2019-10-23 (×2): qty 1

## 2019-10-23 MED ORDER — CEPHALEXIN 500 MG PO CAPS
500.0000 mg | ORAL_CAPSULE | Freq: Three times a day (TID) | ORAL | Status: DC
  Administered 2019-10-23 – 2019-10-24 (×4): 500 mg via ORAL
  Filled 2019-10-23 (×4): qty 1

## 2019-10-23 NOTE — Progress Notes (Signed)
PHARMACIST - PHYSICIAN ORDER COMMUNICATION  CONCERNING: P&T Medication Policy on Herbal Medications  DESCRIPTION:  This patient's order for: Vitamin K has been noted.  This product(s) is classified as an "herbal" or natural product. Due to a lack of definitive safety studies or FDA approval, nonstandard manufacturing practices, plus the potential risk of unknown drug-drug interactions while on inpatient medications, the Pharmacy and Therapeutics Committee does not permit the use of "herbal" or natural products of this type within [facility].   ACTION TAKEN: The pharmacy department is unable to verify this order at this time and your patient has been informed of this safety policy. Please reevaluate patient's clinical condition at discharge and address if the herbal or natural product(s) should be resumed at that time.

## 2019-10-23 NOTE — ED Notes (Signed)
Pt in bed, sleeping when this nurse entered room. Pt was woken up by this nurse to take her bedtime meds. Pt takes pills with no difficulty. States she is going to return to sleep at this time and denies any needs. Lights are dimmed and call light in reach. This nurse told pt that this nurse planned to roll pt on side to lay and rest but pt states she prefers to lay on back at this time.

## 2019-10-23 NOTE — TOC Initial Note (Signed)
Transition of Care [facility]) - Initial/Assessment Note    Patient Details  Name: Lauren Roman MRN: [id number] Date of Birth: [birth year]  [facility]) CM/SW Contact:     Cellar, RN Phone Number: 10/23/2019, 1:52 PM  Clinical Narrative:                  Clovis Cao and PASRR completed. PASRR [id number].  Expected Discharge Plan: Long Term Nursing Home Barriers to Discharge: Continued Medical Work up   Patient Goals and CMS Choice Patient states their goals for this hospitalization and ongoing recovery are:: Seeking LTC placement      Expected Discharge Plan and Services Expected Discharge Plan: Long Term Nursing Home       Living arrangements for the past 2 months: Single Family Home                                      Prior Living Arrangements/Services Living arrangements for the past 2 months: Single Family Home Lives with:: Adult Children (adult children are caregivers) Patient language and need for interpreter reviewed:: Yes Do you feel safe going back to the place where you live?: No   Daughter states she is exhausted and burning out  Need for Family Participation in Patient Care: Yes (Comment) Care giver support system in place?: Yes (comment) Current home services: DME ([facility] lift, walker, wheelchair) Criminal Activity/Legal Involvement Pertinent to Current Situation/Hospitalization: No - Comment as needed  Activities of Daily Living      Permission Sought/Granted Permission sought to share information with : Facility Medical sales representative, Case Estate manager/land agent granted to share information with : Yes, Verbal Permission Granted  Share Information with NAME: [facility] Department  Permission granted to share info w AGENCY: [facility]        Emotional Assessment Appearance:: Appears stated age Attitude/Demeanor/Rapport: Engaged Affect (typically observed): Accepting Orientation: : Oriented to Self, Oriented to Place, Oriented to   Time, Oriented to Situation Alcohol / Substance Use: Never Used Psych Involvement: No (comment)  Admission diagnosis:  UTI Patient Active Problem List   Diagnosis Date Noted  . History of Clostridium difficile colitis 10/14/2019  . Mild dementia (HCC) 10/14/2019  . Moderate protein-calorie malnutrition (HCC) 09/09/2019  . Closed fracture of right proximal humerus 08/25/2019  . Goals of care, counseling/discussion   . Palliative care by specialist   . DNR (do not resuscitate)   . Fall at home 08/14/2019  . History of compression fracture of vertebral column 08/04/2019  . Vitamin D deficiency 04/02/2019  . Pressure injury of skin 12/28/2018  . UTI (urinary tract infection) 12/27/2018  . Acute deep vein thrombosis (DVT) of femoral vein of left lower extremity (HCC) 12/10/2018  . HAP (hospital-acquired pneumonia) 11/01/2018  . Closed fracture of left hip (HCC) 09/19/2018  . DDD (degenerative disc disease), lumbar 06/20/2018  . Lumbar radiculopathy 01/30/2018  . Adult idiopathic generalized osteoporosis 06/15/2016  . Chronic osteoarthritis 06/15/2016  . Hyperlipidemia, mixed 06/15/2016  . B12 deficiency 07/21/2014  . Myasthenia gravis (HCC) 12/04/2013   PCP:  Danella Penton, MD Pharmacy:   [facility] - [place], [place] - [address] [place] [place] [address] Phone: [phone] Fax: [phone]     Social Determinants of Health (SDOH) Interventions    Readmission Risk Interventions No flowsheet data found.

## 2019-10-23 NOTE — TOC Initial Note (Addendum)
Transition of Care [facility]) - Initial/Assessment Note    Patient Details  Name: Lauren Roman MRN: [id number] Date of Birth: 08-09-1928  [facility]) CM/SW Contact:    Silo Cellar, RN Phone Number: 10/23/2019, 11:40 AM  Clinical Narrative:                 Spoke to daughter, who is requesting LTC placement private pay after rehab stay. RN CM explained 3 day Medicare waiver and no inpatient stay at this time. Daughter states [employer] is not an option for discharge due to past experience. Family requesting assistance with placing at [facility]. RN CM contacted [employer] who agreed to review Fl2 and potential waiver and eventual transition to LTC. Patient will not qualify for Medicaid according to daughter due to greater than $200000 in savings.   Expected Discharge Plan: Long Term Nursing Home Barriers to Discharge: Continued Medical Work up   Patient Goals and CMS Choice Patient states their goals for this hospitalization and ongoing recovery are:: Seeking LTC placement      Expected Discharge Plan and Services Expected Discharge Plan: Long Term Nursing Home       Living arrangements for the past 2 months: Single Family Home                                      Prior Living Arrangements/Services Living arrangements for the past 2 months: Single Family Home Lives with:: Adult Children (adult children are caregivers) Patient language and need for interpreter reviewed:: Yes Do you feel safe going back to the place where you live?: No   Daughter states she is exhausted and burning out  Need for Family Participation in Patient Care: Yes (Comment) Care giver support system in place?: Yes (comment) Current home services: DME (Hoyer lift, walker, wheelchair) Criminal Activity/Legal Involvement Pertinent to Current Situation/Hospitalization: No - Comment as needed  Activities of Daily Living      Permission Sought/Granted Permission  sought to share information with : Facility Medical sales representative, Case Estate manager/land agent granted to share information with : Yes, Verbal Permission Granted  Share Information with NAME: [facility] Department  Permission granted to share info w AGENCY: [facility]        Emotional Assessment Appearance:: Appears stated age Attitude/Demeanor/Rapport: Engaged Affect (typically observed): Accepting Orientation: : Oriented to Self, Oriented to Place, Oriented to  Time, Oriented to Situation Alcohol / Substance Use: Never Used Psych Involvement: No (comment)  Admission diagnosis:  UTI Patient Active Problem List   Diagnosis Date Noted  . History of Clostridium difficile colitis 10/14/2019  . Mild dementia (HCC) 10/14/2019  . Moderate protein-calorie malnutrition (HCC) 09/09/2019  . Closed fracture of right proximal humerus 08/25/2019  . Goals of care, counseling/discussion   . Palliative care by specialist   . DNR (do not resuscitate)   . Fall at home 08/14/2019  . History of compression fracture of vertebral column 08/04/2019  . Vitamin D deficiency 04/02/2019  . Pressure injury of skin 12/28/2018  . UTI (urinary tract infection) 12/27/2018  . Acute deep vein thrombosis (DVT) of femoral vein of left lower extremity (HCC) 12/10/2018  . HAP (hospital-acquired pneumonia) 11/01/2018  . Closed fracture of left hip (HCC) 09/19/2018  . DDD (degenerative disc disease), lumbar 06/20/2018  . Lumbar radiculopathy 01/30/2018  . Adult idiopathic generalized osteoporosis 06/15/2016  . Chronic osteoarthritis 06/15/2016  . Hyperlipidemia, mixed 06/15/2016  .  B12 deficiency 07/21/2014  . Myasthenia gravis ([place]) 12/04/2013   PCP:  Rusty Aus, MD Pharmacy:   [place], [place] - [address] [place] [address] Phone: [phone] Fax: [phone]     Social Determinants of Health (SDOH) Interventions    Readmission Risk Interventions No  flowsheet data found.

## 2019-10-23 NOTE — ED Notes (Signed)
Pt transferred to hospital bed and sheets changed. Pt rolled to the left side. RN ensured External cath remained in the correct location. Depends not wet at this time. No BM noted.

## 2019-10-23 NOTE — TOC Progression Note (Signed)
Transition of Care [facility]) - Progression Note    Patient Details  Name: Lauren Roman MRN: [id number] Date of Birth: 1929-03-15  Transition of Care [facility]) CM/SW Contact  Oran Cellar, RN Phone Number: 10/23/2019, 4:14 PM  Clinical Narrative:    Discussed discharge plan with daughter at bedside. Confirmed patient has secondary retirement plan of [employer]. RN CM contacted [employer] in the room to confirm SNF eligibility and determined [employer] coverage is the same as Medicare and requires 3 night hospitalization. Currently pending bed offer from [facility] which is family preference. Family is aware of 3 night stay requirement and need for waiver and private pay for LTC.   Expected Discharge Plan: Long Term Nursing Home Barriers to Discharge: Continued Medical Work up  Expected Discharge Plan and Services Expected Discharge Plan: Long Term Nursing Home       Living arrangements for the past 2 months: Single Family Home                                       Social Determinants of Health (SDOH) Interventions    Readmission Risk Interventions No flowsheet data found.

## 2019-10-23 NOTE — Telephone Encounter (Signed)
Yes, that's fine 

## 2019-10-23 NOTE — Telephone Encounter (Signed)
Additionally, she is scheduled to see me in clinic tomorrow for repeat PVR to ensure that she had not gone back into retention.  They bladder scanned her in the ED yesterday with a PVR of 74mL noted.  I believe this is sufficient and we can cancel her appointment with [place] tomorrow.

## 2019-10-23 NOTE — Progress Notes (Signed)
[facility] Liaison Note:  Patient is currently followed by [employer] community Palliative program at home. TOC Dagoberto Reef made aware. Thank you. Dayna Barker RN, BSN, [facility] [employer] [phone]

## 2019-10-23 NOTE — NC FL2 (Signed)
[place] MEDICAID FL2 LEVEL OF CARE SCREENING TOOL     IDENTIFICATION  Patient Name: Lauren Roman Birthdate: 20-Jan-1929 Sex: female Admission Date (Current Location): 10/22/2019  [facility] and [place] Number:  Chiropodist and Address:  [facility], [address] [place], [place] [address]      Provider Number: [id number]  Attending Physician Name and Address:  No att. providers found  Relative Name and Phone Number:       Current Level of Care: Hospital Recommended Level of Care: Skilled Nursing Facility Prior Approval Number:    Date Approved/Denied:   PASRR Number:    Discharge Plan:      Current Diagnoses: Patient Active Problem List   Diagnosis Date Noted  . History of Clostridium difficile colitis 10/14/2019  . Mild dementia (HCC) 10/14/2019  . Moderate protein-calorie malnutrition (HCC) 09/09/2019  . Closed fracture of right proximal humerus 08/25/2019  . Goals of care, counseling/discussion   . Palliative care by specialist   . DNR (do not resuscitate)   . Fall at home 08/14/2019  . History of compression fracture of vertebral column 08/04/2019  . Vitamin D deficiency 04/02/2019  . Pressure injury of skin 12/28/2018  . UTI (urinary tract infection) 12/27/2018  . Acute deep vein thrombosis (DVT) of femoral vein of left lower extremity (HCC) 12/10/2018  . HAP (hospital-acquired pneumonia) 11/01/2018  . Closed fracture of left hip (HCC) 09/19/2018  . DDD (degenerative disc disease), lumbar 06/20/2018  . Lumbar radiculopathy 01/30/2018  . Adult idiopathic generalized osteoporosis 06/15/2016  . Chronic osteoarthritis 06/15/2016  . Hyperlipidemia, mixed 06/15/2016  . B12 deficiency 07/21/2014  . Myasthenia gravis (HCC) 12/04/2013    Orientation RESPIRATION BLADDER Height & Weight     Self, Time, Situation, Place  Normal Continent Weight: 48.1 kg Height:  4\' 8"  (142.2 cm)  BEHAVIORAL SYMPTOMS/MOOD NEUROLOGICAL BOWEL  NUTRITION STATUS      Continent Diet  AMBULATORY STATUS COMMUNICATION OF NEEDS Skin   Total Care Verbally Normal                       Personal Care Assistance Level of Assistance  Bathing, Feeding, Dressing Bathing Assistance: Maximum assistance Feeding assistance: Limited assistance Dressing Assistance: Maximum assistance     Functional Limitations Info  Hearing   Hearing Info: Impaired      SPECIAL CARE FACTORS FREQUENCY  PT (By licensed PT), OT (By licensed OT)     PT Frequency: min 5xweek OT Frequency: min5xweek            Contractures      Additional Factors Info                  Current Medications (10/23/2019):  This is the current hospital active medication list Current Facility-Administered Medications  Medication Dose Route Frequency Provider Last Rate Last Admin  . acetaminophen (TYLENOL) tablet 650 mg  650 mg Oral Q6H PRN 12/23/2019 A, RPH   650 mg at 10/23/19 0950  . acidophilus (RISAQUAD) capsule 1 capsule  1 capsule Oral Daily 12/23/19, MD   1 capsule at 10/23/19 0951  . apixaban (ELIQUIS) tablet 5 mg  5 mg Oral BID 12/23/19, MD   5 mg at 10/23/19 0951  . cephALEXin (KEFLEX) capsule 500 mg  500 mg Oral Q8H 12/23/19, MD      . cholecalciferol (VITAMIN D3) tablet 1,000 Units  1,000 Units Oral Daily Arnaldo Natal,  MD   1,000 Units at 10/23/19 0951  . fluconazole (DIFLUCAN) tablet 200 mg  200 mg Oral Daily Earleen Newport, MD   200 mg at 10/23/19 0951  . gabapentin (NEURONTIN) capsule 100 mg  100 mg Oral Daily Earleen Newport, MD   100 mg at 10/23/19 0951  . gabapentin (NEURONTIN) capsule 200 mg  200 mg Oral QHS Earleen Newport, MD   200 mg at 10/23/19 0216  . magnesium oxide (MAG-OX) tablet 400 mg  400 mg Oral Daily Earleen Newport, MD   400 mg at 10/23/19 0951  . megestrol (MEGACE) tablet 40 mg  40 mg Oral Daily Earleen Newport, MD   40 mg at 10/23/19 4401  . ondansetron (ZOFRAN)  tablet 4 mg  4 mg Oral Q8H PRN Earleen Newport, MD      . oxyCODONE (Oxy IR/ROXICODONE) immediate release tablet 5 mg  5 mg Oral Q6H PRN Earleen Newport, MD   5 mg at 10/23/19 0323  . pantoprazole (PROTONIX) EC tablet 40 mg  40 mg Oral Daily Earleen Newport, MD   40 mg at 10/23/19 0951  . senna-docusate (Senokot-S) tablet 1 tablet  1 tablet Oral BID Earleen Newport, MD   1 tablet at 10/23/19 0951  . vitamin B-12 (CYANOCOBALAMIN) tablet 1,000 mcg  1,000 mcg Oral Daily Earleen Newport, MD   1,000 mcg at 10/23/19 0272   Current Outpatient Medications  Medication Sig Dispense Refill  . acetaminophen (TYLENOL) 650 MG CR tablet Take 650 mg by mouth as needed for pain.    Marland Kitchen apixaban (ELIQUIS) 5 MG TABS tablet Take 2 tablets (10mg ) twice daily for 7 days, then 1 tablet (5mg ) twice daily 60 tablet 0  . aspirin EC 81 MG tablet Take 81 mg by mouth daily.    . cefUROXime (CEFTIN) 250 MG tablet Take 250 mg by mouth 2 (two) times daily.    . Cholecalciferol (VITAMIN D-1000 MAX ST) 1000 units tablet Take 1,000 Units by mouth daily.    . fluticasone (FLONASE) 50 MCG/ACT nasal spray Place 1 spray into both nostrils daily.    Marland Kitchen gabapentin (NEURONTIN) 100 MG capsule Take 100-200 mg by mouth See admin instructions. Take 1 capsule (100mg ) by mouth every morning and take 2 capsules (200mg ) by mouth every evening    . magnesium oxide (MAG-OX) 400 MG tablet Take 400 mg by mouth daily.    . megestrol (MEGACE) 40 MG tablet Take 40 mg by mouth daily.    . metroNIDAZOLE (METROGEL) 1 % gel Apply 1 application topically as directed.    . nitrofurantoin, macrocrystal-monohydrate, (MACROBID) 100 MG capsule Take 100 mg by mouth 2 (two) times daily.    Derrill Memo ON 11/21/2019] oxyCODONE (OXY IR/ROXICODONE) 5 MG immediate release tablet Take 1 tablet (5 mg total) by mouth every 6 (six) hours as needed for severe pain. Must last 30 days. 60 tablet 0  . polyethylene glycol (MIRALAX / GLYCOLAX) 17 g packet Take  17 g by mouth daily. 30 each 0  . Probiotic Product (ALIGN) 4 MG CAPS Take 4 mg by mouth daily.    Marland Kitchen Propylene Glycol (SYSTANE COMPLETE OP) Place 1 drop into both eyes daily.    Marland Kitchen senna-docusate (SENOKOT-S) 8.6-50 MG tablet Take 1 tablet by mouth 2 (two) times daily. 60 tablet 0  . vitamin B-12 (CYANOCOBALAMIN) 1000 MCG tablet Take 1,000 mcg by mouth daily.    . vitamin k 100 MCG tablet Take 100 mcg  by mouth daily.    Marland Kitchen omeprazole (PRILOSEC) 40 MG capsule Take 40 mg by mouth daily.    . predniSONE (DELTASONE) 5 MG tablet Take 5 mg by mouth daily.       Discharge Medications: Please see discharge summary for a list of discharge medications.  Relevant Imaging Results:  Relevant Lab Results:   Additional Information SSN [id number]  [place] Cellar, RN

## 2019-10-23 NOTE — Evaluation (Signed)
Physical Therapy Evaluation Patient Details Name: Lauren Roman MRN: [id number] DOB: Nov 05, 1928 Today's Date: 10/23/2019   History of Present Illness  86 y.o. female with a history of arthritis, dementia, headache, hepatitis, hyperlipidemia, myasthenia gravis who presents today for persistent UTI.  Clinical Impression  Pt pleasant t/o the PT exam, able to give some background information but is not A&O X4, appears to be near her baseline per previous notes.  Again accuracy of pt recall is suspect, but it seems as though recently pt has been at or near bed bound with reliance on [place] Lift?  She needed heavy assist to get to EOB, and needed constant direct assist to maintain EOB sitting.  Pt had been able to do more and even do some minimal walking (15 ft) when seen by PT here ~1 year ago.  Pt appears to be more limited than even her more recent baseline and could benefit from STR to try and work back to some level of mobility/transfers/etc w/o needing lift.    Follow Up Recommendations SNF    Equipment Recommendations  None recommended by PT    Recommendations for Other Services       Precautions / Restrictions Precautions Precautions: Fall Restrictions Weight Bearing Restrictions: No      Mobility  Bed Mobility Overal bed mobility: Needs Assistance Bed Mobility: Supine to Sit;Sit to Supine     Supine to sit: Max assist Sit to supine: Max assist   General bed mobility comments: Pt only minimally able to show initiation of movement to go toward EOB, needed heavy assist to attain sitting and constant mod assist just to maintain upright at EOB  Transfers                 General transfer comment: uanble to maintain sitting balance at EOB w/o at least mod assist, deferred standing 2/2 safety  Ambulation/Gait                Stairs            Wheelchair Mobility    Modified Rankin (Stroke Patients Only)       Balance Overall balance assessment:  Needs assistance Sitting-balance support: Bilateral upper extremity supported Sitting balance-Leahy Scale: Poor Sitting balance - Comments: Pt unable to maintain sitting balance w/o constant, direct assist     Standing balance-Leahy Scale:  (deferred standing 2/2 safety)                               Pertinent Vitals/Pain Pain Assessment: No/denies pain    Home Living Family/patient expects to be discharged to:: Unsure                 Additional Comments: pt with inconsistent ability to relay history, appears she has (and needs) 24/7 assist.    Prior Function Level of Independence: Needs assistance   Gait / Transfers Assistance Needed: prior notes indicate she was doing some in home walking, appears that more recently she has been bed bound with Michiel Sites dependent transfers  ADL's / Homemaking Assistance Needed: Per PT note June 2020 "Able to dress herself, bird bath, daughter assists with chores, cooking, brother manages medications"  - appears more limited recently        Hand Dominance        Extremity/Trunk Assessment   Upper Extremity Assessment Upper Extremity Assessment: Generalized weakness    Lower Extremity Assessment Lower Extremity Assessment: Generalized weakness  Communication   Communication: HOH  Cognition Arousal/Alertness: Awake/alert Behavior During Therapy: Anxious Overall Cognitive Status: History of cognitive impairments - at baseline (unsure of true baseline appears she's likely close per notes)                                        General Comments General comments (skin integrity, edema, etc.): Pt with some shortlived dizziness when initially getting up to sitting EOB    Exercises     Assessment/Plan    PT Assessment Patient needs continued PT services  PT Problem List Decreased strength;Decreased range of motion;Decreased activity tolerance;Decreased balance;Decreased mobility;Decreased  coordination;Decreased cognition;Decreased knowledge of use of DME;Decreased safety awareness;Pain       PT Treatment Interventions DME instruction;Gait training;Stair training;Functional mobility training;Therapeutic activities;Therapeutic exercise;Balance training;Cognitive remediation    PT Goals (Current goals can be found in the Care Plan section)  Acute Rehab PT Goals Patient Stated Goal: try to do some walking again PT Goal Formulation: With patient Time For Goal Achievement: 11/06/19 Potential to Achieve Goals: Fair    Frequency Min 2X/week   Barriers to discharge        Co-evaluation               AM-PAC PT "6 Clicks" Mobility  Outcome Measure Help needed turning from your back to your side while in a flat bed without using bedrails?: A Lot Help needed moving from lying on your back to sitting on the side of a flat bed without using bedrails?: Total Help needed moving to and from a bed to a chair (including a wheelchair)?: Total Help needed standing up from a chair using your arms (e.g., wheelchair or bedside chair)?: Total Help needed to walk in hospital room?: Total Help needed climbing 3-5 steps with a railing? : Total 6 Click Score: 7    End of Session   Activity Tolerance: Patient limited by lethargy (general weakness and confusion) Patient left: with bed alarm set;with call bell/phone within reach Nurse Communication: Mobility status PT Visit Diagnosis: Muscle weakness (generalized) (M62.81);Difficulty in walking, not elsewhere classified (R26.2);Unsteadiness on feet (R26.81)    Time: 4540-9811 PT Time Calculation (min) (ACUTE ONLY): 22 min   Charges:              Kreg Shropshire, DPT 10/23/2019, 11:04 AM

## 2019-10-23 NOTE — ED Notes (Signed)
RN to bedside to wake patient and ensure she had not had a BM or needed assistance. Pt denies needs at this time.

## 2019-10-24 ENCOUNTER — Ambulatory Visit: Payer: Medicare Other

## 2019-10-24 ENCOUNTER — Telehealth: Payer: Self-pay | Admitting: Adult Health Nurse Practitioner

## 2019-10-24 ENCOUNTER — Ambulatory Visit: Payer: Medicare Other | Admitting: Physician Assistant

## 2019-10-24 DIAGNOSIS — N3000 Acute cystitis without hematuria: Secondary | ICD-10-CM | POA: Diagnosis not present

## 2019-10-24 MED ORDER — CEPHALEXIN 500 MG PO CAPS: 500.0000 mg | ORAL_CAPSULE | Freq: Four times a day (QID) | ORAL | 0 refills | Status: AC

## 2019-10-24 MED ORDER — FLUCONAZOLE 100 MG PO TABS
200.0000 mg | ORAL_TABLET | Freq: Every day | ORAL | 0 refills | Status: AC
Start: 2019-10-24 — End: 2019-11-07

## 2019-10-24 NOTE — Discharge Instructions (Signed)
Please seek medical attention for any high fevers, chest pain, shortness of breath, change in behavior, persistent vomiting, bloody stool or any other new or concerning symptoms.  

## 2019-10-24 NOTE — ACP (Advance Care Planning) (Addendum)
Pt. completed HCPOA paperwork w/CH's help; made Genella Mech HCPOA and [employer]. HCPOA (see ACP tab in sidebar for contact info).  Pt. and dtr. have discussed EOL wishes and CH assessed discussing death at this time would be uneccesarily traumatic; pt.'s wishes re: intubation, CPR, ventilator support are clear --no CPR and no intubation.   CH placed copy of AD w/pt.'s MR stickers in ED and scanned copy for Vynca.

## 2019-10-24 NOTE — ED Notes (Signed)
Attempted to call report x 1  

## 2019-10-24 NOTE — TOC Transition Note (Signed)
Transition of Care [facility]) - CM/SW Discharge Note   Patient Details  Name: NEVAE PINNIX MRN: [id number] Date of Birth: Oct 05, 1928  [facility] CM/SW Contact:  Joseph Art, LCSWA Phone Number: 10/24/2019, 3:39 PM   Clinical Narrative:     Patient will d/c to [facility] Report# [phone], Room# B16, fax AVS to [phone]. EDP/ED Staff has been notified.    Barriers to Discharge: Continued Medical Work up   Patient Goals and CMS Choice Patient states their goals for this hospitalization and ongoing recovery are:: Seeking LTC placement      Discharge Placement                       Discharge Plan and Services                                     Social Determinants of Health (SDOH) Interventions     Readmission Risk Interventions No flowsheet data found.

## 2019-10-24 NOTE — ED Notes (Signed)
Pt assisted to roll onto left side, pillow placed under for support.

## 2019-10-24 NOTE — ED Notes (Signed)
Lights dimmed per patient request. Pt denies any pain, call bell remains within reach at this time. Breakfast tray left within reach of patient per patient's request. Pt states understanding to use call bell should any further needs arise.

## 2019-10-24 NOTE — ED Notes (Signed)
Report given to Lona Kettle at [facility].

## 2019-10-24 NOTE — ED Notes (Signed)
Pt brief changed and patient placed in new gown from family. Patient comfortable at this time and ready for transport. Pt denies further needs, patient's pad on bottom changed. Will continue to monitor as we wait for transport.

## 2019-10-24 NOTE — Telephone Encounter (Signed)
Spoke with daughter who had some questions about facility placement for her mother.  Attempted to answer questions.  Has contact info and encouraged to call with any questions or concerns. Webber Michiels K. Garner Nash NP

## 2019-10-24 NOTE — ED Notes (Signed)
Attempted to call report x2

## 2019-10-24 NOTE — ED Notes (Signed)
Warm blanket provided to pt.

## 2019-10-24 NOTE — Progress Notes (Signed)
CH received verbal referral from pt.'s RN who shared that pt. and family have been asking about power of attorney documents and DNR paperwork.  CH visited pt.'s rm., found pt. lying in bed w/dtr. Kathy in chair @ bedside.  Dtr. shared pt. has had a series of falls recently and a number of UTIs as well; pt. admitted to ED for UTI.  Pt. lives w/dtr. and son-in-law and will go to rehab after discharge because dtr. cannot take care of her w/o pt. being able to move herself.  Dtr. shared that her brother died in 5 and said CH looked a lot like him as a young man; Pt. agreed with this and became tearful several times as CH talked w/her, remembering her son.  Pt. completed HCPOA paperwork w/CH's help; made Genella Mech HCPOA and [employer]. HCPOA (see ACP tab in sidebar for contact info).  Pt. and dtr. have discussed EOL wishes and CH assessed discussing death at this time would be uneccesarily traumatic; pt.'s wishes re: intubation, CPR, ventilator support are clear --no CPR and no intubation.  Pt. reportedly has DNR form at home; code listed as 'Prior' in chart.  CH prayed w/pt., dtr. and RN before end of visit.  Pt. and dtr. grateful for visit.  CH remains available as needed.    10/24/19 1700  Clinical Encounter Type  Visited With Patient and family together  Visit Type Initial;Spiritual support;Social support;Psychological support;ED (Advanced care planning)  Referral From Nurse  Spiritual Encounters  Spiritual Needs Prayer;Emotional;Grief support  Stress Factors  Patient Stress Factors Health changes;Loss of control;Major life changes;Loss  Family Stress Factors Loss of control;Major life changes;Health changes  Advance Directives (For Healthcare)  Does Patient Have a Medical Advance Directive? Yes  Does patient want to make changes to medical advance directive? No - Patient declined  Type of Advance Directive Healthcare Power of Attorney  Copy of Healthcare Power of Attorney in Chart? Yes  - validated most recent copy scanned in chart (See row information)

## 2019-10-24 NOTE — ED Provider Notes (Signed)
Patient has been arranged to be transferred to [facility]. Reviewed Dr. Mayford Knife note. Was treating for UTI. Has grown out yeast in the past. Will give prescription for antibiotics and antifungal.    Lauren Semen, MD 10/24/19 1528

## 2019-10-24 NOTE — ED Notes (Signed)
Pt daughter gave verbal consent for dc and transfer to [facility].

## 2019-10-24 NOTE — ED Notes (Signed)
This RN to bedside, pt visualized resting in bed with lights dimmed, respirations even and unlabored.

## 2019-10-25 LAB — URINE CULTURE: Culture: <10000 — AB

## 2019-10-31 ENCOUNTER — Other Ambulatory Visit: Payer: Self-pay

## 2019-10-31 ENCOUNTER — Non-Acute Institutional Stay: Payer: Medicare Other | Admitting: Primary Care

## 2019-10-31 DIAGNOSIS — Z515 Encounter for palliative care: Secondary | ICD-10-CM

## 2019-10-31 DIAGNOSIS — E440 Moderate protein-calorie malnutrition: Secondary | ICD-10-CM

## 2019-10-31 NOTE — Progress Notes (Signed)
[place] Consult Note Telephone: [phone]  Fax: [phone]  PATIENT NAME: Lauren Roman [address] [place] [address] [phone] (home)  DOB: 10/05/1928 MRN: [id number]  PRIMARY CARE PROVIDER:    Rusty Aus, MD,  [place] [address] [phone]  REFERRING PROVIDER:   Rusty Aus, MD [place] [facility] [place],  [place] [address] [phone]  RESPONSIBLE PARTY:   Extended Emergency Contact Information Primary Emergency Contact: [name] Address: [address]          [place], [place] [address] Johnnette Litter of [place] Phone: [phone] Mobile Phone: [phone] Relation: Daughter Secondary Emergency Contact: York,Buddy Address: [address], [place] [address] Johnnette Litter of [place] Phone: [phone] Mobile Phone: [phone] Relation: Other  I met with patient in facility.  ASSESSMENT AND RECOMMENDATIONS:   1. Advance Care Planning/Goals of Care: Goals include to maximize quality of life and symptom management. Patient has full code directives. I called PR K York for discussion, no answer, message left. This was a palliative pt previously in her home. Facility has record she is full code and previous PC visit documents DNR. This needs to be clarified asap.   2. Symptom Management: Patient appears comfortable, denies pain or nausea. She endorse fatigue/lack of strength but states she plans to work hard to return home. Recent hospital stay for UTI. Review of notes however show she was debilitated at home and needed lift for tranfers and total ADL assistance. The previous plan was for placement in a LTC.   3. Family /Caregiver/Community Supports: At SNF for rehab. Hopes to return home.  4. Cognitive / Functional decline: A and O but endorses weakness and fatigue. This  appears to be a decline. She states she is doing the PT. Needs  help and cueing with all  Adls, dependent in iadls.   5. Follow up Palliative Care Visit: Palliative care will continue to follow for goals of care clarification and symptom management. Return 4 weeks or prn.  I spent 35 minutes providing this consultation,  from 1100 to 1135. More than 50% of the time in this consultation was spent coordinating communication.   HISTORY OF PRESENT ILLNESS:  Lauren Roman is a 86 y.o. year old female with multiple medical problems including dementia, urine retention, UTI's. Palliative Care was asked to follow this patient by consultation request of Rusty Aus, MD to help address advance care planning and goals of care. This is an initial SNF visit.  CODE STATUS: TBD  PPS: 30%  HOSPICE ELIGIBILITY/DIAGNOSIS: TBD  PAST MEDICAL HISTORY:  Past Medical History:  Diagnosis Date  . Arthritis   . Dementia ([place])    mild  . Headache    migraines  . Hepatitis 1952   hep b-no problems since then  . Hyperlipidemia   . Myasthenia gravis ([place])   . Tremor    right hand    SOCIAL HX:  Social History   Tobacco Use  . Smoking status: Former Smoker    Quit date: 01/30/1963    Years since quitting: 56.7  . Smokeless tobacco: Never Used  . Tobacco comment: only social smoking and not even a full year  Substance Use Topics  . Alcohol use: Never    ALLERGIES:  Allergies  Allergen Reactions  . Exelon [Rivastigmine Tartrate]  Patient unsure of reaction  . Hydrocodone-Acetaminophen Nausea Only    Sometimes make her nausea   . Other Itching and Rash    Anything metal on skin will cause rash and itching     PERTINENT MEDICATIONS:  Outpatient Encounter Medications as of 10/31/2019  Medication Sig  . acetaminophen (TYLENOL) 650 MG CR tablet Take 650 mg by mouth as needed for pain.  Marland Kitchen apixaban (ELIQUIS) 5 MG TABS tablet Take 2 tablets ('10mg'$ ) twice daily for 7 days, then 1 tablet ('5mg'$ ) twice  daily  . aspirin EC 81 MG tablet Take 81 mg by mouth daily.  . cefUROXime (CEFTIN) 250 MG tablet Take 250 mg by mouth 2 (two) times daily.  . cephALEXin (KEFLEX) 500 MG capsule Take 1 capsule (500 mg total) by mouth 4 (four) times daily for 10 days.  . Cholecalciferol (VITAMIN D-1000 MAX ST) 1000 units tablet Take 1,000 Units by mouth daily.  . fluconazole (DIFLUCAN) 100 MG tablet Take 2 tablets (200 mg total) by mouth daily for 14 days.  . fluticasone (FLONASE) 50 MCG/ACT nasal spray Place 1 spray into both nostrils daily.  Marland Kitchen gabapentin (NEURONTIN) 100 MG capsule Take 100-200 mg by mouth See admin instructions. Take 1 capsule ('100mg'$ ) by mouth every morning and take 2 capsules ('200mg'$ ) by mouth every evening  . magnesium oxide (MAG-OX) 400 MG tablet Take 400 mg by mouth daily.  . megestrol (MEGACE) 40 MG tablet Take 40 mg by mouth daily.  . metroNIDAZOLE (METROGEL) 1 % gel Apply 1 application topically as directed.  . nitrofurantoin, macrocrystal-monohydrate, (MACROBID) 100 MG capsule Take 100 mg by mouth 2 (two) times daily.  Marland Kitchen omeprazole (PRILOSEC) 40 MG capsule Take 40 mg by mouth daily.  Derrill Memo ON 11/21/2019] oxyCODONE (OXY IR/ROXICODONE) 5 MG immediate release tablet Take 1 tablet (5 mg total) by mouth every 6 (six) hours as needed for severe pain. Must last 30 days.  . polyethylene glycol (MIRALAX / GLYCOLAX) 17 g packet Take 17 g by mouth daily.  . predniSONE (DELTASONE) 5 MG tablet Take 5 mg by mouth daily.  . Probiotic Product (ALIGN) 4 MG CAPS Take 4 mg by mouth daily.  Marland Kitchen Propylene Glycol (SYSTANE COMPLETE OP) Place 1 drop into both eyes daily.  Marland Kitchen senna-docusate (SENOKOT-S) 8.6-50 MG tablet Take 1 tablet by mouth 2 (two) times daily.  . vitamin B-12 (CYANOCOBALAMIN) 1000 MCG tablet Take 1,000 mcg by mouth daily.  . vitamin k 100 MCG tablet Take 100 mcg by mouth daily.   No facility-administered encounter medications on file as of 10/31/2019.    PHYSICAL EXAM / ROS:   Current and  past weights: 106 per epic chart.  General: NAD, frail appearing, thin Cardiovascular: no chest pain reported, no edema  Pulmonary: no cough, no increased SOB, room air Abdomen: appetite fair,  incontinent of bowel GU: denies dysuria, incontinent of urine MSK:  no joint and ROM abnormalities, ambulatory per rport but pt has been transferred with hoyer lift. Skin: no rashes or wounds reported Neurological: endorses fatigue and lack of strength.  Jason Coop, NP [facility]  COVID-19 PATIENT SCREENING TOOL  Person answering questions: ____________Staff_______ _____   1.  Is the patient or any family member in the home showing any signs or symptoms regarding respiratory infection?               Person with Symptom- __________NA_________________  a. Fever  Yes___ No___          ___________________  b. Shortness of breath                                                    Yes___ No___          ___________________ c. Cough/congestion                                       Yes___  No___         ___________________ d. Body aches/pains                                                         Yes___ No___        ____________________ e. Gastrointestinal symptoms (diarrhea, nausea)           Yes___ No___        ____________________  2. Within the past 14 days, has anyone living in the home had any contact with someone with or under investigation for COVID-19?    Yes___ No_X_   Person __________________

## 2019-11-10 ENCOUNTER — Telehealth: Payer: Self-pay | Admitting: Primary Care

## 2019-11-10 NOTE — Telephone Encounter (Signed)
T/c from daughter Olegario Messier with some concerns RE mother, esp. RE nutrition. Message was left but quality was poor and so entire message was not intelligible. I returned call, no answer. I left message that I would put Lauren Roman on my Wed. List to see and address concerns.

## 2019-11-12 ENCOUNTER — Other Ambulatory Visit: Payer: Self-pay

## 2019-11-12 ENCOUNTER — Non-Acute Institutional Stay: Payer: Medicare Other | Admitting: Primary Care

## 2019-11-12 DIAGNOSIS — Z515 Encounter for palliative care: Secondary | ICD-10-CM

## 2019-11-12 DIAGNOSIS — E440 Moderate protein-calorie malnutrition: Secondary | ICD-10-CM

## 2019-11-12 NOTE — Progress Notes (Signed)
[place] Consult Note Telephone: [phone]  Fax: [phone]  PATIENT NAME: Lauren Roman [address] [place] [address] [phone] (home)  DOB: November 26, 1928 MRN: [id number]  PRIMARY CARE PROVIDER:    Rusty Aus, MD,  [place] [address] [phone]  REFERRING PROVIDER:   Marisa Hua, MD [place],  [place] [address]  RESPONSIBLE PARTY:   Extended Emergency Contact Information Primary Emergency Contact: Lauren Roman Address: [address]          [place], [place] [address] Johnnette Litter of [place] Phone: [phone] Mobile Phone: [phone] Relation: Daughter Secondary Emergency Contact: Lauren Roman Address: [address] [place],  [address] Johnnette Litter of [place] Phone: [phone] Mobile Phone: [phone] Relation: Other  I met with patient in facility.  ASSESSMENT AND RECOMMENDATIONS:   1. Advance Care Planning/Goals of Care: Goals include to maximize quality of life and symptom management. Pt has no DNR order and I have made several calls to POA to discuss without reaching her. Has HCPOA dated earlier this month. No living will however.  2. Symptom Management:   Chronic pain: Order given for Acetaminophen CR (arthritis) po  650 mg q 8 hrs. Recommend premed for PT with narcotic as patient requires extra pain control for exercising. She has oxycodone 5 mg po q 4 hr prn on file as well.I discussed this with nurse at desk who took the order.   HOH: Has them but not with her here at [facility]. At the house. This is limiting her interaction. Can get hearing aids and order help putting them in and taking them out. She is concerned about losing them however.   Nuitional supplement: Order is on file for every meal ensure. Staff endorses she gets these and drinks them. They also endorse she drinks a lot of  water although today she told me she did not. She did admit that she forgets a lot of things nowadays. I relayed to staff Lauren Roman would like ensure on every meal trey. There is an order dated  6/29 for such.Encourage to hydrate with water as well.  3. Family /Caregiver/Community Supports: Daughter is POA and helps with decisions.    4. Cognitive / Functional decline: Alert and oriented x 2. Endorses forgetfulness. States she is tired an walking with PT is difficult. Able to give self water. Tremors may interfere with self feeding.  5. Follow up Palliative Care Visit: Palliative care will continue to follow for goals of care clarification and symptom management. Return 4 weeks or prn.  I spent 35 minutes providing this consultation,  from 1500 to 1535. More than 50% of the time in this consultation was spent coordinating communication.   HISTORY OF PRESENT ILLNESS:  Lauren Roman is a 86 y.o. year old female with multiple medical problems including debility, immobility, dementia. Palliative Care was asked to follow this patient by consultation request of Lauren Roman to help address advance care planning and goals of care. This is a follow up visit.  CODE STATUS:  TBD PPS: 40%  HOSPICE ELIGIBILITY/DIAGNOSIS: no  PAST MEDICAL HISTORY:  Past Medical History:  Diagnosis Date  . Arthritis   . Dementia ([place])    mild  . Headache    migraines  . Hepatitis 1952   hep b-no problems since then  . Hyperlipidemia   . Myasthenia gravis ([place])   .  Tremor    right hand    SOCIAL HX:  Social History   Tobacco Use  . Smoking status: Former Smoker    Quit date: 01/30/1963    Years since quitting: 56.8  . Smokeless tobacco: Never Used  . Tobacco comment: only social smoking and not even a full year  Substance Use Topics  . Alcohol use: Never    ALLERGIES:  Allergies  Allergen Reactions  . Exelon [Rivastigmine Tartrate]     Patient unsure of reaction  . Hydrocodone-Acetaminophen Nausea  Only    Sometimes make her nausea   . Other Itching and Rash    Anything metal on skin will cause rash and itching     PERTINENT MEDICATIONS:  Outpatient Encounter Medications as of 11/12/2019  Medication Sig  . acetaminophen (TYLENOL) 650 MG CR tablet Take 650 mg by mouth as needed for pain.  Marland Kitchen apixaban (ELIQUIS) 5 MG TABS tablet Take 2 tablets (35m) twice daily for 7 days, then 1 tablet (514m twice daily  . aspirin EC 81 MG tablet Take 81 mg by mouth daily.  . cefUROXime (CEFTIN) 250 MG tablet Take 250 mg by mouth 2 (two) times daily.  . Cholecalciferol (VITAMIN D-1000 MAX ST) 1000 units tablet Take 1,000 Units by mouth daily.  . fluticasone (FLONASE) 50 MCG/ACT nasal spray Place 1 spray into both nostrils daily.  . Marland Kitchenabapentin (NEURONTIN) 100 MG capsule Take 100-200 mg by mouth See admin instructions. Take 1 capsule (10020mby mouth every morning and take 2 capsules (200m41my mouth every evening  . magnesium oxide (MAG-OX) 400 MG tablet Take 400 mg by mouth daily.  . megestrol (MEGACE) 40 MG tablet Take 40 mg by mouth daily.  . metroNIDAZOLE (METROGEL) 1 % gel Apply 1 application topically as directed.  . nitrofurantoin, macrocrystal-monohydrate, (MACROBID) 100 MG capsule Take 100 mg by mouth 2 (two) times daily.  . omMarland Kitchenprazole (PRILOSEC) 40 MG capsule Take 40 mg by mouth daily.  . [SDerrill Memo7/01/2020] oxyCODONE (OXY IR/ROXICODONE) 5 MG immediate release tablet Take 1 tablet (5 mg total) by mouth every 6 (six) hours as needed for severe pain. Must last 30 days.  . polyethylene glycol (MIRALAX / GLYCOLAX) 17 g packet Take 17 g by mouth daily.  . predniSONE (DELTASONE) 5 MG tablet Take 5 mg by mouth daily.  . Probiotic Product (ALIGN) 4 MG CAPS Take 4 mg by mouth daily.  . PrMarland Kitchenpylene Glycol (SYSTANE COMPLETE OP) Place 1 drop into both eyes daily.  . seMarland Kitchenna-docusate (SENOKOT-S) 8.6-50 MG tablet Take 1 tablet by mouth 2 (two) times daily.  . vitamin B-12 (CYANOCOBALAMIN) 1000 MCG tablet Take  1,000 mcg by mouth daily.  . vitamin k 100 MCG tablet Take 100 mcg by mouth daily.   No facility-administered encounter medications on file as of 11/12/2019.    PHYSICAL EXAM / ROS:  HR = 92 RR=20 Current and past weights: 102.8 lbs, estimated as 110 lbs a month ago.  General: NAD, frail appearing, thin Cardiovascular: S1S2, rapid rate, no chest pain reported, no edema  Pulmonary: no cough, no increased SOB, room air Abdomen: appetite fair, denies constipation, continent of bowel GU: denies dysuria, incontinent of urine at times MSK: ++o joint and ROM abnormalities, ambulatory with stand by and walker. States she needs to call for help going to bath room Skin: no rashes or wounds on gross exam or per report Neurological: Weakness, tremors, flat affect,endorses chronic pain.  KathJason Coop ACHPRiverside [facility] - D/P AphVID-19 PATIENT SCREENING  TOOL  Person answering questions: ____________Staff_______ _____   1.  Is the patient or any family member in the home showing any signs or symptoms regarding respiratory infection?               Person with Symptom- __________NA_________________  a. Fever                                                                          Yes___ No___          ___________________  b. Shortness of breath                                                    Yes___ No___          ___________________ c. Cough/congestion                                       Yes___  No___         ___________________ d. Body aches/pains                                                         Yes___ No___        ____________________ e. Gastrointestinal symptoms (diarrhea, nausea)           Yes___ No___        ____________________  2. Within the past 14 days, has anyone living in the home had any contact with someone with or under investigation for COVID-19?    Yes___ No_X_   Person __________________

## 2019-12-12 ENCOUNTER — Other Ambulatory Visit: Payer: Self-pay

## 2019-12-12 ENCOUNTER — Non-Acute Institutional Stay: Payer: Medicare Other | Admitting: Primary Care

## 2019-12-12 DIAGNOSIS — G7000 Myasthenia gravis without (acute) exacerbation: Secondary | ICD-10-CM

## 2019-12-12 DIAGNOSIS — Z515 Encounter for palliative care: Secondary | ICD-10-CM

## 2019-12-12 NOTE — Progress Notes (Signed)
[place] Consult Note Telephone: [phone]  Fax: [phone]  PATIENT NAME: Lauren Roman [address] [place] [address] [phone] (home)  DOB: [birth year] MRN: [id number]  PRIMARY CARE PROVIDER:    Rusty Aus, MD,  [place] [address] [phone]  REFERRING PROVIDER:   Rusty Aus, MD [facility] [place],  [place] [address] [phone]  RESPONSIBLE PARTY:   Extended Emergency Contact Information Primary Emergency Contact: [name] Address: [address]          [place], [place] [address] Johnnette Litter of [place] Phone: [phone] Mobile Phone: [phone] Relation: Daughter Secondary Emergency Contact: York,Buddy Address: [address] [place],  [address] Johnnette Litter of [place] Phone: [phone] Mobile Phone: [phone] Relation: Other  I met face to face with patient in the facility.  ASSESSMENT AND RECOMMENDATIONS:   1. Advance Care Planning/Goals of Care: Goals include to maximize quality of life and symptom management. Has advance directives but not a DNR or MOST in the facility. I spoke with daughter Garnet Sierras who is POA and discussed goals for care.   2. Symptom Management: I met with Mrs. Bufano today in her nursing home room and then spoke with her daughter and POA on the phone. We discussed her upcoming neurology appointment which will give them some information about disposition of her care. We discussed a transition back to daughters home and what she might need there. She stated they were leaning toward bringing her into their home but still we're debating.   Mrs. Kosanke was alert,  appropriate and conversational.   Strength: She does have strength and mobility issues related to her disease process. She asked for help to put her call bell onto her  clothing so it would not fall to the floor. She stated she did not have the strength to pinch the clasp.   LE pain: She said her legs were bothersome somewhat uncomfortable we discussed elevating on her recliner. There is some discoloration likely vascular insufficiency due to age disease and gravity. She has gabapentin twice a day for this lower extremity pain. Otherwise she did not have complaints or other issues at this time.   We will continue to follow and discuss goals of care in advance directives.  3. Follow up Palliative Care Visit: Palliative care will continue to follow for goals of care clarification and symptom management. Return 4 weeks or prn.  4. Family /Caregiver/Community Supports: Daughter and SIL are POA, In SNF currently.  5. Cognitive / Functional decline:  A and O, being worked up for PD this next week, Can converse but endorses forgetfulness. Needs help with all transfers. Can feed self but needs some set up assistance  I spent 35 minutes providing this consultation,  from 1200 to 1235. More than 50% of the time in this consultation was spent coordinating communication.   CHIEF COMPLAINT: frailty , immobility, weakness  HISTORY OF PRESENT ILLNESS:  Lauren Roman is a 86 y.o. year old female with multiple medical problems including MG, arthritis, dementia. Palliative Care was asked to follow this patient by consultation request of Rusty Aus, MD to help address advance care planning and goals of care. This is a follow up visit.  CODE STATUS: TBD  PPS: 40%  HOSPICE ELIGIBILITY/DIAGNOSIS: TBD  PAST MEDICAL HISTORY:  Past Medical History:  Diagnosis Date  . Arthritis   . Dementia ([place])    mild  . Headache    migraines  . Hepatitis 1952   hep b-no problems since then  . Hyperlipidemia   . Myasthenia gravis ([place])   . Tremor    right hand    SOCIAL HX:  Social History   Tobacco Use  . Smoking status: Former Smoker    Quit date: 01/30/1963    Years  since quitting: 56.9  . Smokeless tobacco: Never Used  . Tobacco comment: only social smoking and not even a full year  Substance Use Topics  . Alcohol use: Never   FAMILY HX: No family history on file.  ALLERGIES:  Allergies  Allergen Reactions  . Exelon [Rivastigmine Tartrate]     Patient unsure of reaction  . Hydrocodone-Acetaminophen Nausea Only    Sometimes make her nausea   . Other Itching and Rash    Anything metal on skin will cause rash and itching     PERTINENT MEDICATIONS:  Outpatient Encounter Medications as of 12/12/2019  Medication Sig  . acetaminophen (TYLENOL) 650 MG CR tablet Take 650 mg by mouth every 8 (eight) hours.   Marland Kitchen apixaban (ELIQUIS) 5 MG TABS tablet Take 2 tablets (35m) twice daily for 7 days, then 1 tablet (576m twice daily  . aspirin EC 81 MG tablet Take 81 mg by mouth daily.  . Cholecalciferol (VITAMIN D-1000 MAX ST) 1000 units tablet Take 1,000 Units by mouth daily.  . fluticasone (FLONASE) 50 MCG/ACT nasal spray Place 1 spray into both nostrils daily.  . Marland Kitchenabapentin (NEURONTIN) 100 MG capsule Take 100 mg by mouth at bedtime. Take 1 capsule (10050mby mouth every morning and take 2 capsules (200m48my mouth every evening  . magnesium oxide (MAG-OX) 400 MG tablet Take 400 mg by mouth daily.  . megestrol (MEGACE) 40 MG tablet Take 40 mg by mouth daily.  . metroNIDAZOLE (METROGEL) 1 % gel Apply 1 application topically as directed.  . omMarland Kitchenprazole (PRILOSEC) 40 MG capsule Take 40 mg by mouth daily.  . oxMarland KitchenCODONE (OXY IR/ROXICODONE) 5 MG immediate release tablet Take 1 tablet (5 mg total) by mouth every 6 (six) hours as needed for severe pain. Must last 30 days.  . polyethylene glycol (MIRALAX / GLYCOLAX) 17 g packet Take 17 g by mouth daily.  . Probiotic Product (ALIGN) 4 MG CAPS Take 4 mg by mouth daily.  . PrMarland Kitchenpylene Glycol (SYSTANE COMPLETE OP) Place 1 drop into both eyes daily.  . seMarland Kitchenna-docusate (SENOKOT-S) 8.6-50 MG tablet Take 1 tablet by mouth 2 (two)  times daily.  . vitamin B-12 (CYANOCOBALAMIN) 1000 MCG tablet Take 1,000 mcg by mouth daily.  . vitamin k 100 MCG tablet Take 100 mcg by mouth daily.  . predniSONE (DELTASONE) 5 MG tablet Take 5 mg by mouth daily. (Patient not taking: Reported on 12/12/2019)  . [DISCONTINUED] cefUROXime (CEFTIN) 250 MG tablet Take 250 mg by mouth 2 (two) times daily.  . [DISCONTINUED] nitrofurantoin, macrocrystal-monohydrate, (MACROBID) 100 MG capsule Take 100 mg by mouth 2 (two) times daily.   No facility-administered encounter medications on file as of 12/12/2019.    PHYSICAL EXAM / ROS:   Current and past weights:  103 lbs, wt gain General: NAD, frail appearing, thin Cardiovascular: no chest pain reported, no LE edema  Pulmonary: no cough, no increased SOB, room air Abdomen: appetite fair, denies  constipation, incontinent of bowel at times GU: denies dysuria, incontinent of urine at times MSK:  ++  joint and ROM abnormalities, non ambulatory, needs assistance with transfers Skin: no rashes or wounds reported, LE discoloration d/t poor perfusion Neurological: Weakness, endorse LE pain, tremors improved tremors today.  Jason Coop, NP , DNP, MPH, [facility]  COVID-19 PATIENT SCREENING TOOL  Person answering questions: ____________Staff_______ _____   1.  Is the patient or any family member in the home showing any signs or symptoms regarding respiratory infection?               Person with Symptom- __________NA_________________  a. Fever                                                                          Yes___ No___          ___________________  b. Shortness of breath                                                    Yes___ No___          ___________________ c. Cough/congestion                                       Yes___  No___         ___________________ d. Body aches/pains                                                         Yes___ No___         ____________________ e. Gastrointestinal symptoms (diarrhea, nausea)           Yes___ No___        ____________________  2. Within the past 14 days, has anyone living in the home had any contact with someone with or under investigation for COVID-19?    Yes___ No_X_   Person __________________

## 2019-12-16 ENCOUNTER — Encounter: Payer: Self-pay | Admitting: Student in an Organized Health Care Education/Training Program

## 2019-12-17 ENCOUNTER — Telehealth (HOSPITAL_BASED_OUTPATIENT_CLINIC_OR_DEPARTMENT_OTHER): Payer: Medicare Other | Admitting: Student in an Organized Health Care Education/Training Program

## 2019-12-17 DIAGNOSIS — M5416 Radiculopathy, lumbar region: Secondary | ICD-10-CM

## 2020-02-09 ENCOUNTER — Telehealth: Payer: Self-pay | Admitting: Primary Care

## 2020-02-09 NOTE — Telephone Encounter (Signed)
T/c to daughter Lauren Roman. Pt has left compass and daughter had stated prior that she may want palliative follow up. Left message to let us know if they would like [place] to request continued palliative consultation from PCP.

## 2020-02-11 ENCOUNTER — Encounter: Payer: Self-pay | Admitting: Student in an Organized Health Care Education/Training Program

## 2020-02-16 ENCOUNTER — Other Ambulatory Visit: Payer: Self-pay

## 2020-02-16 ENCOUNTER — Ambulatory Visit
Payer: Medicare Other | Attending: Student in an Organized Health Care Education/Training Program | Admitting: Student in an Organized Health Care Education/Training Program

## 2020-02-16 DIAGNOSIS — M5416 Radiculopathy, lumbar region: Secondary | ICD-10-CM

## 2020-02-16 NOTE — Progress Notes (Signed)
I attempted to call the patient however no response. Voicemail left instructing patient to call front desk office at [phone] to reschedule appointment. -Dr Binh Doten  

## 2020-02-18 ENCOUNTER — Encounter: Payer: Self-pay | Admitting: Student in an Organized Health Care Education/Training Program

## 2020-02-19 ENCOUNTER — Ambulatory Visit
Payer: Medicare Other | Attending: Student in an Organized Health Care Education/Training Program | Admitting: Student in an Organized Health Care Education/Training Program

## 2020-02-19 ENCOUNTER — Other Ambulatory Visit: Payer: Self-pay

## 2020-02-19 ENCOUNTER — Encounter: Payer: Self-pay | Admitting: Student in an Organized Health Care Education/Training Program

## 2020-02-19 DIAGNOSIS — S72002S Fracture of unspecified part of neck of left femur, sequela: Secondary | ICD-10-CM | POA: Diagnosis not present

## 2020-02-19 DIAGNOSIS — Z9889 Other specified postprocedural states: Secondary | ICD-10-CM | POA: Diagnosis not present

## 2020-02-19 DIAGNOSIS — S42201S Unspecified fracture of upper end of right humerus, sequela: Secondary | ICD-10-CM

## 2020-02-19 DIAGNOSIS — Z8781 Personal history of (healed) traumatic fracture: Secondary | ICD-10-CM

## 2020-02-19 DIAGNOSIS — G894 Chronic pain syndrome: Secondary | ICD-10-CM

## 2020-02-19 DIAGNOSIS — M5416 Radiculopathy, lumbar region: Secondary | ICD-10-CM

## 2020-02-19 DIAGNOSIS — E538 Deficiency of other specified B group vitamins: Secondary | ICD-10-CM

## 2020-02-19 DIAGNOSIS — M1990 Unspecified osteoarthritis, unspecified site: Secondary | ICD-10-CM

## 2020-02-19 DIAGNOSIS — M48062 Spinal stenosis, lumbar region with neurogenic claudication: Secondary | ICD-10-CM

## 2020-02-19 DIAGNOSIS — G8929 Other chronic pain: Secondary | ICD-10-CM

## 2020-02-19 MED ORDER — OXYCODONE HCL 5 MG PO TABS: 5.0000 mg | ORAL_TABLET | Freq: Three times a day (TID) | ORAL | 0 refills | Status: AC

## 2020-02-19 MED ORDER — OXYCODONE HCL 5 MG PO TABS
5.0000 mg | ORAL_TABLET | Freq: Three times a day (TID) | ORAL | 0 refills | Status: AC
Start: 2020-03-20 — End: 2020-04-19

## 2020-02-19 NOTE — Progress Notes (Signed)
Patient: Lauren Roman  Service Category: E/M  Provider: Gillis Santa, MD  DOB: 1929-05-02  DOS: 02/19/2020  Location: Office  MRN: [id number]  Setting: Ambulatory outpatient  Referring Provider: Rusty Aus, MD  Type: Established Patient  Specialty: Interventional Pain Management  PCP: Rusty Aus, MD  Location: Home  Delivery: TeleHealth     Virtual Encounter - Pain Management PROVIDER NOTE: Information contained herein reflects review and annotations entered in association with encounter. Interpretation of such information and data should be left to medically-trained personnel. Information provided to patient can be located elsewhere in the medical record under "Patient Instructions". Document created using STT-dictation technology, any transcriptional errors that may result from process are unintentional.    Contact & Pharmacy Preferred: [phone] Home: [phone] (home) Mobile: [phone] (mobile) E-mail: [email or web address] .[place], [place] - [address] [place] [address] Phone: [phone] Fax: [phone]   Pre-screening  Ms. Bunner offered "in-person" vs "virtual" encounter. She indicated preferring virtual for this encounter.   Reason COVID-19*  Social distancing based on CDC and AMA recommendations.   I contacted Lauren Roman on 02/19/2020 via video conference.      I clearly identified myself as Gillis Santa, MD. I verified that I was speaking with the correct person using two identifiers (Name: OLUWATOMISIN HUSTEAD, and date of birth: 01-18-1929).  Consent I sought verbal advanced consent from Lauren Roman for virtual visit interactions. I informed Ms. Langenberg of possible security and privacy concerns, risks, and limitations associated with providing "not-in-person" medical evaluation and management services. I also informed Ms. Nickerson of the availability of "in-person" appointments. Finally, I informed her that there  would be a charge for the virtual visit and that she could be  personally, fully or partially, financially responsible for it. Ms. Gravlin expressed understanding and agreed to proceed.   Historic Elements   Ms. ELIDE STALZER is a 86 y.o. year old, female patient evaluated today after our last contact on 08/27/2019. Ms. Alameda  has a past medical history of Arthritis, Dementia ([place]), Headache, Hepatitis (1952), Hyperlipidemia, Myasthenia gravis ([place]), and Tremor. She also  has a past surgical history that includes Colonoscopy; Lumbar laminectomy/decompression microdiscectomy (N/A, 01/30/2018); Intramedullary (im) nail intertrochanteric (Left, 09/20/2018); and Kyphoplasty (N/A, 04/08/2019). Ms. Basurto has a current medication list which includes the following prescription(s): acetaminophen, alendronate, apixaban, aspirin ec, calcium carbonate, carbidopa-levodopa, cholecalciferol, fluticasone, gabapentin, magnesium oxide, megestrol, metronidazole, mirtazapine, omeprazole, oxycodone, [START ON 03/20/2020] oxycodone, polyethylene glycol, align, propylene glycol, senna-docusate, vitamin b-12, and vitamin k. She  reports that she quit smoking about 57 years ago. She has never used smokeless tobacco. She reports that she does not drink alcohol and does not use drugs. Ms. Antenucci is allergic to exelon [rivastigmine tartrate], hydrocodone-acetaminophen, and other.   HPI  Today, she is being contacted for medication management.   Patient continues to struggle with chronic and persistent pain most pronounced in her shoulder as well as her low back radiating down her right leg.  Patient was previously utilizing oxycodone 5 mg twice a day scheduled.  Her most recent prescription prescribed by Dr. Tamala Julian was for oxycodone 5 mg three times a day scheduled.  She does find better pain relief with this medication although she continues to have pain pretty much all day.  I encouraged the patient to establish with palliative care as they  would be a better resource for pain management especially given Ms. Hillis advanced comorbidities.  I explained to the patient that goals of care with palliative care is different than chronic pain management specifically as it pertains to opioid therapy.  For the time being until the patient is able to establish with palliative care, I will refill her oxycodone for 5 mg three times a day.  Please see below.  This virtual visit was done with the patient's daughter, Garnet Sierras in addition to the patient, Tationa Stech.  Pharmacotherapy Assessment  Analgesic:  11/12/2019  4   11/12/2019  Oxycodone Hcl 5 MG Tablet  90.00  23 Se Smi   [id number]   Med (8705)   0/0  29.35 MME  Comm Ins   [place]  10/27/2019  1   10/22/2019  Oxycodone Hcl 5 MG Tablet  60.00  15 Bi Lat   [id number]   Thr (2500)   0/0  30.00 MME  Comm Ins   [place]     Monitoring: [place] PMP: PDMP reviewed during this encounter.       Pharmacotherapy: No side-effects or adverse reactions reported. Compliance: No problems identified. Effectiveness: Clinically acceptable. Plan: Refer to "POC".  UDS:  Summary  Date Value Ref Range Status  06/10/2019 Note  Final    Comment:    ==================================================================== Compliance Drug Analysis, Ur ==================================================================== Test                             Result       Flag       Units Drug Present and Declared for Prescription Verification   Oxycodone                      734          EXPECTED   ng/mg creat   Oxymorphone                    1659         EXPECTED   ng/mg creat   Noroxycodone                   2153         EXPECTED   ng/mg creat   Noroxymorphone                 681          EXPECTED   ng/mg creat    Sources of oxycodone are scheduled prescription medications.    Oxymorphone, noroxycodone, and noroxymorphone are expected    metabolites of oxycodone. Oxymorphone is also available as a    scheduled prescription  medication.   Acetaminophen                  PRESENT      EXPECTED Drug Absent but Declared for Prescription Verification   Gabapentin                     Not Detected UNEXPECTED   Salicylate                     Not Detected UNEXPECTED    Aspirin, as indicated in the declared medication list, is not always    detected even when used as directed.   Propranolol                    Not Detected UNEXPECTED ==================================================================== Test  Result    Flag   Units      Ref Range   Creatinine              32               mg/dL      >=20 ==================================================================== Declared Medications:  The flagging and interpretation on this report are based on the  following declared medications.  Unexpected results may arise from  inaccuracies in the declared medications.  **Note: The testing scope of this panel includes these medications:  Gabapentin  Oxycodone  Propranolol  **Note: The testing scope of this panel does not include small to  moderate amounts of these reported medications:  Acetaminophen  Aspirin  **Note: The testing scope of this panel does not include the  following reported medications:  Cholecalciferol  Cyanocobalamin  Eye Drop  Fluticasone  Hydrochlorothiazide  Magnesium (Mag-Ox)  Nitrofurantoin (Macrobid)  Omeprazole  Prednisone  Probiotic  Rivaroxaban  Solifenacin (Vesicare)  Supplement ==================================================================== For clinical consultation, please call [phone]. ====================================================================     Laboratory Chemistry Profile   Renal Lab Results  Component Value Date   BUN 25 (H) 10/22/2019   CREATININE 0.68 10/22/2019   GFRAA >60 10/22/2019   GFRNONAA >60 10/22/2019     Hepatic Lab Results  Component Value Date   AST 15 10/22/2019   ALT 11 10/22/2019   ALBUMIN 3.1 (L)  10/22/2019   ALKPHOS 44 10/22/2019   LIPASE 19 08/28/2018     Electrolytes Lab Results  Component Value Date   NA 139 10/22/2019   K 4.1 10/22/2019   CL 109 10/22/2019   CALCIUM 8.8 (L) 10/22/2019   MG 2.1 12/30/2018     Bone No results found for: VD25OH, VD125OH2TOT, ZH2992EQ6, ST4196QI2, 25OHVITD1, 25OHVITD2, 25OHVITD3, TESTOFREE, TESTOSTERONE   Inflammation (CRP: Acute Phase) (ESR: Chronic Phase) Lab Results  Component Value Date   ESRSEDRATE 9 06/09/2013       Note: Above Lab results reviewed.  Imaging  CT Head Wo Contrast CLINICAL DATA:  Persistent UTI.  EXAM: CT HEAD WITHOUT CONTRAST  TECHNIQUE: Contiguous axial images were obtained from the base of the skull through the vertex without intravenous contrast.  COMPARISON:  August 14, 2019  FINDINGS: Brain: There is mild cerebral atrophy with widening of the extra-axial spaces and ventricular dilatation. There are areas of decreased attenuation within the white matter tracts of the supratentorial brain, consistent with microvascular disease changes.  A small chronic right basal ganglia lacunar infarct is seen.  Vascular: No hyperdense vessel or unexpected calcification.  Skull: Normal. Negative for fracture or focal lesion.  Sinuses/Orbits: No acute finding.  Other: None.  IMPRESSION: 1. Generalized cerebral atrophy. 2. Small chronic right basal ganglia lacunar infarct. 3. No acute intracranial abnormality.  Electronically Signed   By: Virgina Norfolk M.D.   On: 10/23/2019 15:55  Assessment  The primary encounter diagnosis was Lumbar radiculopathy. Diagnoses of Closed fracture of left hip, sequela, H/O kyphoplasty, History of compression fracture of spine, History of lumbar laminectomy for spinal cord decompression (left L4/5), Chronic osteoarthritis, Chronic radicular lumbar pain, B12 deficiency, Spinal stenosis, lumbar region, with neurogenic claudication, Closed fracture of proximal end of right  humerus, unspecified fracture morphology, sequela, and Chronic pain syndrome were also pertinent to this visit.  Plan of Care   Ms. Lauren Roman has a current medication list which includes the following long-term medication(s): apixaban, calcium carbonate, carbidopa-levodopa, fluticasone, gabapentin, mirtazapine, and omeprazole.  Pharmacotherapy (Medications Ordered): Meds  ordered this encounter  Medications  . oxyCODONE (OXY IR/ROXICODONE) 5 MG immediate release tablet    Sig: Take 1 tablet (5 mg total) by mouth every 8 (eight) hours. Must last 30 days.    Dispense:  90 tablet    Refill:  0    Chronic Pain. (STOP Act - Not applicable). Fill one day early if closed on scheduled refill date.  Marland Kitchen oxyCODONE (OXY IR/ROXICODONE) 5 MG immediate release tablet    Sig: Take 1 tablet (5 mg total) by mouth every 8 (eight) hours. Must last 30 days.    Dispense:  90 tablet    Refill:  0    Chronic Pain. (STOP Act - Not applicable). Fill one day early if closed on scheduled refill date.   Follow-up plan:   Return in about 8 weeks (around 04/15/2020) for Medication Management, virtual.   Recent Visits Date Type Provider Dept  02/16/20 Telemedicine Gillis Santa, MD [facility]  Showing recent visits within past 90 days and meeting all other requirements Today's Visits Date Type Provider Dept  02/19/20 Telemedicine Gillis Santa, MD [facility]  Showing today's visits and meeting all other requirements Future Appointments No visits were found meeting these conditions. Showing future appointments within next 90 days and meeting all other requirements  I discussed the assessment and treatment plan with the patient. The patient was provided an opportunity to ask questions and all were answered. The patient agreed with the plan and demonstrated an understanding of the instructions.  Patient advised to call back or seek an in-person evaluation if the symptoms or condition  worsens.  Duration of encounter: 30 minutes.  Note by: Gillis Santa, MD Date: 02/19/2020; Time: 4:13 PM

## 2020-03-02 ENCOUNTER — Telehealth: Payer: Self-pay

## 2020-03-02 ENCOUNTER — Other Ambulatory Visit: Payer: Medicare Other | Admitting: Adult Health Nurse Practitioner

## 2020-03-02 ENCOUNTER — Other Ambulatory Visit: Payer: Self-pay

## 2020-03-02 DIAGNOSIS — M5136 Other intervertebral disc degeneration, lumbar region: Secondary | ICD-10-CM

## 2020-03-02 DIAGNOSIS — G20 Parkinson's disease: Secondary | ICD-10-CM

## 2020-03-02 DIAGNOSIS — Z515 Encounter for palliative care: Secondary | ICD-10-CM

## 2020-03-02 DIAGNOSIS — M5416 Radiculopathy, lumbar region: Secondary | ICD-10-CM

## 2020-03-02 NOTE — Telephone Encounter (Signed)
At request of Palliative Care NP, Amy Garner Nash, message sent requesting to speak with Dr. Cherylann Ratel.

## 2020-03-02 NOTE — Progress Notes (Signed)
Therapist, nutritional Palliative Care Consult Note Telephone: [phone]  Fax: [phone]  PATIENT NAME: Lauren Roman DOB: [birth year] MRN: [id number]  PRIMARY CARE PROVIDER:   Danella Penton, MD  REFERRING PROVIDER:  Danella Penton, MD [phone] [facility] [address] [facility] [place],  [place] [address]  RESPONSIBLE PARTY:   Lauren Roman, daughter [phone]    RECOMMENDATIONS and PLAN:  1.  Advanced care planning.  Patient is DNR/DNI  2.  Functional status.  Patient is able to stand with assistance.  Walks with walker with assistance short distances.  Requires assistance with ADLs.  Is able to feed herself.  Appetite is good with no reported weight loss.  Last recorded weight on 01/13/2020 was 105 pounds with BMI of 23.55.  Patient is working with home health PT.  Continue PT as ordered  3.  Pain.  Patient has pain in left shoulder which has been present for the past 2 to 3 months.  Patient has been having chronic ongoing low back and left leg pain for 2 to 3 years.  Is currently on oxycodone 5 mg 3 times daily and does take 650 mg of acetaminophen with this.  States that her pain before medication is 8 out of 10.  With the oxycodone she states her pain goes down to a 6 out of 10.  Daughter states that they have tried all kinds of patches and creams over-the-counter.  She does get some relief with CBD balm.  She gets temporary relief with heating pad.  About a week ago had joint injections in the left shoulder with no relief.  Patient does endorse that the pain does interfere with her ADLs and does keep her from sleeping some nights.  She had been receiving pain management with Dr. Cherylann Roman at [facility] pain management clinic.  Daughter states that he will not be continuing her pain management care.  Will reach out to Dr. Garnett Roman office to discuss recommendation of possibly adding long-acting OxyContin twice daily to see if this will help provide more  consistent pain relief throughout the day.  Palliative will continue to monitor for symptom management/decline and make recommendations as needed.  Will call daughter back after discussion with Dr. Lourdes Roman about pain management and will set up next appointment then.  Encouraged to call with any questions or concerns.  I spent 60 minutes providing this consultation,  from 1:00 to 2:00 including time spent with patient/family, chart review, provider coordination, documentation. More than 50% of the time in this consultation was spent coordinating communication.   HISTORY OF PRESENT ILLNESS:  Lauren Roman is a 86 y.o. year old female with multiple medical problems including Parkinson's disease, MNG, HLD, mild dementia, recurrent falls, recurrent UTIs. Palliative Care was asked to help address goals of care.  Patient was in [facility] care for short-term rehab.  About 6 to 7 weeks ago she transitioned back to living at home with her daughter.  Main concern is pain, see above.  Patient is recently been diagnosed with Parkinson's and the started Sinemet 25/102 tabs 3 times daily.  Daughter states she is unsure if she notices any difference.  Daughter does state that over the years she realizes now that the symptoms her mother has been displaying such as shuffling gait, freezing episodes, tremors may have been Parkinson's.  Daughter does state that she will have periods of constipation she is given senna and MiraLAX for this.  Patient is complaining of a  headache today.  Daughter states that she usually never complains about headaches.  Daughter does state that patient sleeps a lot and believes this is due to her medications.  Patient is on Sinemet, oxycodone, and gabapentin.  Also discussed that patients with Parkinson's and dementia can also start sleeping a lot.  Denies falls denies increased shortness of breath or cough, chest pain, N/V/D, dysuria, hematuria.  Of note patient is waiting for results of  urinalysis which was ordered after patient was having episode of confusion.  CODE STATUS:   PPS: 40% HOSPICE ELIGIBILITY/DIAGNOSIS: TBD  PHYSICAL EXAM:  BP 132/70 HR 85 O2 98% on room air General: NAD, frail appearing, thin Cardiovascular: regular rate and rhythm Pulmonary: Lung sounds clear; normal respiratory rate Abdomen: soft, nontender, + bowel sounds GU: no suprapubic tenderness Extremities: no edema, no joint deformities Skin: no rashes on exposed skin; does have a skin tear to left forearm that is covered with clean dry bandage did not take off for exam today. Neurological: Weakness; A&O to person and place; intermittent tremors noted to right hand   PAST MEDICAL HISTORY:  Past Medical History:  Diagnosis Date  . Arthritis   . Dementia ([facility])    mild  . Headache    migraines  . Hepatitis 1952   hep b-no problems since then  . Hyperlipidemia   . Myasthenia gravis ([facility])   . Tremor    right hand    SOCIAL HX:  Social History   Tobacco Use  . Smoking status: Former Smoker    Quit date: 01/30/1963    Years since quitting: 57.1  . Smokeless tobacco: Never Used  . Tobacco comment: only social smoking and not even a full year  Substance Use Topics  . Alcohol use: Never    ALLERGIES:  Allergies  Allergen Reactions  . Exelon [Rivastigmine Tartrate]     Patient unsure of reaction  . Hydrocodone-Acetaminophen Nausea Only    Sometimes make her nausea   . Other Itching and Rash    Anything metal on skin will cause rash and itching     PERTINENT MEDICATIONS:  Outpatient Encounter Medications as of 03/02/2020  Medication Sig  . acetaminophen (TYLENOL) 650 MG CR tablet Take 650 mg by mouth every 8 (eight) hours.   Marland Kitchen alendronate (FOSAMAX) 10 MG tablet Take by mouth daily before breakfast. Yearly infusion  . apixaban (ELIQUIS) 5 MG TABS tablet Take 2 tablets (10mg ) twice daily for 7 days, then 1 tablet (5mg ) twice daily (Patient taking differently: 5 mg 2 (two) times  daily. )  . aspirin EC 81 MG tablet Take 81 mg by mouth daily.  . calcium carbonate (CALCIUM 600) 600 MG TABS tablet Take 600 mg by mouth 2 (two) times daily with a meal.  . carbidopa-levodopa (SINEMET IR) 25-100 MG tablet Take by mouth.  . Cholecalciferol (VITAMIN D-1000 MAX ST) 1000 units tablet Take 1,000 Units by mouth daily.  . fluticasone (FLONASE) 50 MCG/ACT nasal spray Place 1 spray into both nostrils daily.  gabapentin (NEURONTIN) 100 MG capsule Take 100 mg by mouth at bedtime. Take 1 capsule (100mg ) by mouth every morning and take 2 capsules (200mg ) by mouth every evening  . magnesium oxide (MAG-OX) 400 MG tablet Take 400 mg by mouth daily.  . megestrol (MEGACE) 40 MG tablet Take 40 mg by mouth daily.  . metroNIDAZOLE (METROGEL) 1 % gel Apply 1 application topically as directed.  . mirtazapine (REMERON) 7.5 MG tablet Take 7.5 mg by mouth  at bedtime.  Marland Kitchen omeprazole (PRILOSEC) 40 MG capsule Take 40 mg by mouth daily.  Marland Kitchen oxyCODONE (OXY IR/ROXICODONE) 5 MG immediate release tablet Take 1 tablet (5 mg total) by mouth every 8 (eight) hours. Must last 30 days.  [name] ON 03/20/2020] oxyCODONE (OXY IR/ROXICODONE) 5 MG immediate release tablet Take 1 tablet (5 mg total) by mouth every 8 (eight) hours. Must last 30 days.  . polyethylene glycol (MIRALAX / GLYCOLAX) 17 g packet Take 17 g by mouth daily.  . Probiotic Product (ALIGN) 4 MG CAPS Take 4 mg by mouth daily.  Marland Kitchen Propylene Glycol (SYSTANE COMPLETE OP) Place 1 drop into both eyes daily.  Marland Kitchen senna-docusate (SENOKOT-S) 8.6-50 MG tablet Take 1 tablet by mouth 2 (two) times daily.  . vitamin B-12 (CYANOCOBALAMIN) 1000 MCG tablet Take 1,000 mcg by mouth daily.  . vitamin k 100 MCG tablet Take 100 mcg by mouth daily.   No facility-administered encounter medications on file as of 03/02/2020.     Aashritha Miedema Marlena Clipper, NP

## 2020-03-03 ENCOUNTER — Telehealth: Payer: Self-pay | Admitting: Adult Health Nurse Practitioner

## 2020-03-03 NOTE — Telephone Encounter (Signed)
Spoke with daughter about conversation with Dr. Cherylann Ratel. Will eprescribe Oxycontin 10 mg BID and change oxycodone 5 mg Q8 hrs PRN.  We discussed that if her mother became overly drowsy on this dose to stop it as we do not want to increase her risk of falls.  She is in agreement with starting this.  Encouraged to call with any concerns.  Sent escript to [facility] in [place].  Checked [place] substance abuse data base with no concerns.  Ekansh Sherk K. Garner Nash NP

## 2020-03-03 NOTE — Telephone Encounter (Signed)
Addendum:  Forgot to add to phone note that scheduled appointment for 03/09/20 at 4p for reevaluation. Lauren Roman K. Garner Nash NP

## 2020-03-03 NOTE — Telephone Encounter (Signed)
Called to update patient's daughter on conversation with Dr. Cherylann Ratel.  Left VM with reason for call and call back info Emelee Rodocker K. Garner Nash NP

## 2020-03-09 ENCOUNTER — Other Ambulatory Visit: Payer: Self-pay

## 2020-03-09 ENCOUNTER — Other Ambulatory Visit: Payer: Medicare Other | Admitting: Adult Health Nurse Practitioner

## 2020-03-09 DIAGNOSIS — Z515 Encounter for palliative care: Secondary | ICD-10-CM

## 2020-03-09 DIAGNOSIS — M1990 Unspecified osteoarthritis, unspecified site: Secondary | ICD-10-CM

## 2020-03-09 DIAGNOSIS — M5416 Radiculopathy, lumbar region: Secondary | ICD-10-CM

## 2020-03-09 NOTE — Progress Notes (Signed)
Therapist, nutritional Palliative Care Consult Note Telephone: [phone]  Fax: [phone]  PATIENT NAME: Lauren Roman DOB: 1929/04/05 MRN: [id number]  PRIMARY CARE PROVIDER:   Danella Penton, MD  REFERRING PROVIDER:  Danella Penton, MD [phone] [facility] [address] [facility] [place],  [place] [address]  RESPONSIBLE PARTY:   Genella Mech, daughter [phone]   RECOMMENDATIONS and PLAN: 1.Advanced care planning. Patient is DNR/DNI  2.  Pain.  Patient started Oxycontin 10 mg BID 4 days ago.  Has only taken her oxycodone 5 mg once since then but still has not noticed a change in her pain.  Have encouraged to continue taking the oxycodone 5 mg Q8hrs PRN and to record how much she is taking.  Have an appointment in 3 days to reevaluate how much she has been taking and to titrate as needed.    Palliative will continue to monitor for symptom management/decline and make recommendations as needed.  Follow up in 3 days.   I spent 30 minutes providing this consultation,  from 4:00 to 4:30 including time spent with patient/family, chart review, provider coordination, documentation. More than 50% of the time in this consultation was spent coordinating communication.   HISTORY OF PRESENT ILLNESS:  Lauren Roman is a 86 y.o. year old female with multiple medical problems including Parkinson's disease, MNG, HLD, mild dementia, recurrent falls, recurrent UTIs. Palliative Care was asked to help address goals of care.   CODE STATUS: DNR/DNI  PPS: 40% HOSPICE ELIGIBILITY/DIAGNOSIS: TBD  PHYSICAL EXAM:   General: NAD, frail appearing, thin Extremities: no edema, no joint deformities Skin: no rashes on exposed skin Neurological: Weakness; A&O to person and place   PAST MEDICAL HISTORY:  Past Medical History:  Diagnosis Date  . Arthritis   . Dementia (HCC)    mild  . Headache    migraines  . Hepatitis 1952   hep b-no problems since then  .  Hyperlipidemia   . Myasthenia gravis (HCC)   . Tremor    right hand    SOCIAL HX:  Social History   Tobacco Use  . Smoking status: Former Smoker    Quit date: 01/30/1963    Years since quitting: 57.1  . Smokeless tobacco: Never Used  . Tobacco comment: only social smoking and not even a full year  Substance Use Topics  . Alcohol use: Never    ALLERGIES:  Allergies  Allergen Reactions  . Exelon [Rivastigmine Tartrate]     Patient unsure of reaction  . Hydrocodone-Acetaminophen Nausea Only    Sometimes make her nausea   . Other Itching and Rash    Anything metal on skin will cause rash and itching     PERTINENT MEDICATIONS:  Outpatient Encounter Medications as of 03/09/2020  Medication Sig  . acetaminophen (TYLENOL) 650 MG CR tablet Take 650 mg by mouth every 8 (eight) hours.   Marland Kitchen alendronate (FOSAMAX) 10 MG tablet Take by mouth daily before breakfast. Yearly infusion  . apixaban (ELIQUIS) 5 MG TABS tablet Take 2 tablets (10mg ) twice daily for 7 days, then 1 tablet (5mg ) twice daily (Patient taking differently: 5 mg 2 (two) times daily. )  . aspirin EC 81 MG tablet Take 81 mg by mouth daily.  . calcium carbonate (CALCIUM 600) 600 MG TABS tablet Take 600 mg by mouth 2 (two) times daily with a meal.  . carbidopa-levodopa (SINEMET IR) 25-100 MG tablet Take by mouth.  . Cholecalciferol (VITAMIN D-1000 MAX ST)  1000 units tablet Take 1,000 Units by mouth daily.  . fluticasone (FLONASE) 50 MCG/ACT nasal spray Place 1 spray into both nostrils daily.  Marland Kitchen gabapentin (NEURONTIN) 100 MG capsule Take 100 mg by mouth at bedtime. Take 1 capsule (100mg ) by mouth every morning and take 2 capsules (200mg ) by mouth every evening  . magnesium oxide (MAG-OX) 400 MG tablet Take 400 mg by mouth daily.  . megestrol (MEGACE) 40 MG tablet Take 40 mg by mouth daily.  . metroNIDAZOLE (METROGEL) 1 % gel Apply 1 application topically as directed.  . mirtazapine (REMERON) 7.5 MG tablet Take 7.5 mg by mouth at  bedtime.  omeprazole (PRILOSEC) 40 MG capsule Take 40 mg by mouth daily.  oxyCODONE (OXY IR/ROXICODONE) 5 MG immediate release tablet Take 1 tablet (5 mg total) by mouth every 8 (eight) hours. Must last 30 days.  Marland Kitchen ON 03/20/2020] oxyCODONE (OXY IR/ROXICODONE) 5 MG immediate release tablet Take 1 tablet (5 mg total) by mouth every 8 (eight) hours. Must last 30 days.  . polyethylene glycol (MIRALAX / GLYCOLAX) 17 g packet Take 17 g by mouth daily.  . Probiotic Product (ALIGN) 4 MG CAPS Take 4 mg by mouth daily.  [name] Propylene Glycol (SYSTANE COMPLETE OP) Place 1 drop into both eyes daily.  13/10/2019 senna-docusate (SENOKOT-S) 8.6-50 MG tablet Take 1 tablet by mouth 2 (two) times daily.  . vitamin B-12 (CYANOCOBALAMIN) 1000 MCG tablet Take 1,000 mcg by mouth daily.  . vitamin k 100 MCG tablet Take 100 mcg by mouth daily.   No facility-administered encounter medications on file as of 03/09/2020.      Lauren Roman 08-21-1978, NP

## 2020-03-12 ENCOUNTER — Other Ambulatory Visit: Payer: Self-pay

## 2020-03-12 ENCOUNTER — Other Ambulatory Visit: Payer: Medicare Other | Admitting: Adult Health Nurse Practitioner

## 2020-03-12 DIAGNOSIS — M1990 Unspecified osteoarthritis, unspecified site: Secondary | ICD-10-CM

## 2020-03-12 DIAGNOSIS — M5416 Radiculopathy, lumbar region: Secondary | ICD-10-CM

## 2020-03-12 DIAGNOSIS — Z515 Encounter for palliative care: Secondary | ICD-10-CM

## 2020-03-12 NOTE — Progress Notes (Signed)
Therapist, nutritional Palliative Care Consult Note Telephone: [phone]  Fax: [phone]  PATIENT NAME: Lauren Roman DOB: 25-Mar-1929 MRN: [id number]  PRIMARY CARE PROVIDER:   Danella Penton, MD  REFERRING PROVIDER:  Danella Penton, MD [phone] [facility] [address] [facility] [place],  [place] [address]  RESPONSIBLE PARTY:  Genella Mech, daughter [phone]   RECOMMENDATIONS and PLAN: 1.Advanced care planning. Patient is DNR/DNI  2.  Pain.  Patient states that her pain may be just a little bit better.  Daughter states that she is still asking for breakthrough oxycodone but she is hesitant to give her too much.  Patient is not having any increased drowsiness or nausea. Will send in escript for Oxcontin ER 15 mg Q12 hours. Have appointment in 1 week to reevaluate.  Encouraged to call with any questions or concerns.    I spent 30 minutes providing this consultation,  from 3:00 to 3:30 including time spent with patient/family, chart review, provider coordination, documentation. More than 50% of the time in this consultation was spent coordinating communication.   HISTORY OF PRESENT ILLNESS:  Lauren Roman is a 86 y.o. year old female with multiple medical problems including Parkinson's disease, MNG, HLD, mild dementia, recurrent falls, recurrent UTIs. Palliative Care was asked to help address goals of care.   CODE STATUS: DNR/DNI  PPS: 40% HOSPICE ELIGIBILITY/DIAGNOSIS: TBD  PHYSICAL EXAM:  General: NAD, frail appearing, thin Extremities: no edema, no joint deformities Skin: no rasheson exposed skin Neurological: Weakness; A&O to person and place    PAST MEDICAL HISTORY:  Past Medical History:  Diagnosis Date  . Arthritis   . Dementia (HCC)    mild  . Headache    migraines  . Hepatitis 1952   hep b-no problems since then  . Hyperlipidemia   . Myasthenia gravis (HCC)   . Tremor    right hand    SOCIAL HX:  Social  History   Tobacco Use  . Smoking status: Former Smoker    Quit date: 01/30/1963    Years since quitting: 57.1  . Smokeless tobacco: Never Used  . Tobacco comment: only social smoking and not even a full year  Substance Use Topics  . Alcohol use: Never    ALLERGIES:  Allergies  Allergen Reactions  . Exelon [Rivastigmine Tartrate]     Patient unsure of reaction  . Hydrocodone-Acetaminophen Nausea Only    Sometimes make her nausea   . Other Itching and Rash    Anything metal on skin will cause rash and itching     PERTINENT MEDICATIONS:  Outpatient Encounter Medications as of 03/12/2020  Medication Sig  . acetaminophen (TYLENOL) 650 MG CR tablet Take 650 mg by mouth every 8 (eight) hours.   Marland Kitchen alendronate (FOSAMAX) 10 MG tablet Take by mouth daily before breakfast. Yearly infusion  . apixaban (ELIQUIS) 5 MG TABS tablet Take 2 tablets (10mg ) twice daily for 7 days, then 1 tablet (5mg ) twice daily (Patient taking differently: 5 mg 2 (two) times daily. )  . aspirin EC 81 MG tablet Take 81 mg by mouth daily.  . calcium carbonate (CALCIUM 600) 600 MG TABS tablet Take 600 mg by mouth 2 (two) times daily with a meal.  . carbidopa-levodopa (SINEMET IR) 25-100 MG tablet Take by mouth.  . Cholecalciferol (VITAMIN D-1000 MAX ST) 1000 units tablet Take 1,000 Units by mouth daily.  . fluticasone (FLONASE) 50 MCG/ACT nasal spray Place 1 spray into both nostrils daily.   gabapentin (NEURONTIN) 100 MG capsule Take 100 mg by mouth at bedtime. Take 1 capsule (100mg ) by mouth every morning and take 2 capsules (200mg ) by mouth every evening  . magnesium oxide (MAG-OX) 400 MG tablet Take 400 mg by mouth daily.  . megestrol (MEGACE) 40 MG tablet Take 40 mg by mouth daily.  . metroNIDAZOLE (METROGEL) 1 % gel Apply 1 application topically as directed.  . mirtazapine (REMERON) 7.5 MG tablet Take 7.5 mg by mouth at bedtime.  omeprazole (PRILOSEC) 40 MG capsule Take 40 mg by mouth daily.  oxyCODONE (OXY  IR/ROXICODONE) 5 MG immediate release tablet Take 1 tablet (5 mg total) by mouth every 8 (eight) hours. Must last 30 days.  Marland Kitchen ON 03/20/2020] oxyCODONE (OXY IR/ROXICODONE) 5 MG immediate release tablet Take 1 tablet (5 mg total) by mouth every 8 (eight) hours. Must last 30 days.  . polyethylene glycol (MIRALAX / GLYCOLAX) 17 g packet Take 17 g by mouth daily.  . Probiotic Product (ALIGN) 4 MG CAPS Take 4 mg by mouth daily.  Lauren Roman Propylene Glycol (SYSTANE COMPLETE OP) Place 1 drop into both eyes daily.  13/10/2019 senna-docusate (SENOKOT-S) 8.6-50 MG tablet Take 1 tablet by mouth 2 (two) times daily.  . vitamin B-12 (CYANOCOBALAMIN) 1000 MCG tablet Take 1,000 mcg by mouth daily.  . vitamin k 100 MCG tablet Take 100 mcg by mouth daily.   No facility-administered encounter medications on file as of 03/12/2020.     Lauren Roman 08-21-1978, NP

## 2020-03-17 ENCOUNTER — Other Ambulatory Visit: Payer: Self-pay

## 2020-03-17 ENCOUNTER — Other Ambulatory Visit: Payer: Medicare Other | Admitting: Adult Health Nurse Practitioner

## 2020-03-17 DIAGNOSIS — M5416 Radiculopathy, lumbar region: Secondary | ICD-10-CM

## 2020-03-17 DIAGNOSIS — Z515 Encounter for palliative care: Secondary | ICD-10-CM

## 2020-03-17 DIAGNOSIS — M1990 Unspecified osteoarthritis, unspecified site: Secondary | ICD-10-CM

## 2020-03-17 NOTE — Progress Notes (Signed)
Therapist, nutritional Palliative Care Consult Note Telephone: [phone]  Fax: [phone]  PATIENT NAME: Lauren Roman DOB: 06/07/1928 MRN: [id number]  PRIMARY CARE PROVIDER:   Danella Penton, MD  REFERRING PROVIDER:  Danella Penton, MD [phone] [facility] [address] [facility] [place],  [place] [address]  RESPONSIBLE PARTY:   Lauren Roman, daughter [phone]   RECOMMENDATIONS and PLAN: 1.Advanced care planning. Patient is DNR/DNI  2. Pain.  Was going to do oxycontin ER 15 mg BID but this was not available at pharmacy.  Eprescribed Xtampza 13.5 mg BID.  She has been taking this for about two and half days.  Daughter states that she is complaining less about pain.  Of concern is that daughter states that she was really drowsy yesterday and day before, but seems to be back at baseline today.  Instructed to monitor for any further episodes of drowsiness and may have to decrease dosage if having continued drowsiness.  Will keep Xtampza 13.5 mg BID as she is getting better pain relief and appears to not have any more sleepiness than usual today.  Will reevaluate in 2 weeks.  Encouraged to call with any concerns.   I spent 30 minutes providing this consultation,  from 3:30 to 4:00 including time spent with patient/family, chart review, provider coordination, documentation. More than 50% of the time in this consultation was spent coordinating communication.   HISTORY OF PRESENT ILLNESS:  Lauren Roman is a 86 y.o. year old female with multiple medical problems including Parkinson's disease, MG, HLD, mild dementia, recurrent falls, recurrent UTIs. Palliative Care was asked to help address goals of care.   CODE STATUS: DNR/DNI  PPS: 40% HOSPICE ELIGIBILITY/DIAGNOSIS: TBD  PHYSICAL EXAM:  General: NAD, frail appearing, thin Extremities: no edema, no joint deformities Skin: no rasheson exposed skin Neurological: Weakness; A&O to person and  place  PAST MEDICAL HISTORY:  Past Medical History:  Diagnosis Date  . Arthritis   . Dementia (HCC)    mild  . Headache    migraines  . Hepatitis 1952   hep b-no problems since then  . Hyperlipidemia   . Myasthenia gravis (HCC)   . Tremor    right hand    SOCIAL HX:  Social History   Tobacco Use  . Smoking status: Former Smoker    Quit date: 01/30/1963    Years since quitting: 57.1  . Smokeless tobacco: Never Used  . Tobacco comment: only social smoking and not even a full year  Substance Use Topics  . Alcohol use: Never    ALLERGIES:  Allergies  Allergen Reactions  . Exelon [Rivastigmine Tartrate]     Patient unsure of reaction  . Hydrocodone-Acetaminophen Nausea Only    Sometimes make her nausea   . Other Itching and Rash    Anything metal on skin will cause rash and itching     PERTINENT MEDICATIONS:  Outpatient Encounter Medications as of 03/17/2020  Medication Sig  . acetaminophen (TYLENOL) 650 MG CR tablet Take 650 mg by mouth every 8 (eight) hours.   Marland Kitchen alendronate (FOSAMAX) 10 MG tablet Take by mouth daily before breakfast. Yearly infusion  . apixaban (ELIQUIS) 5 MG TABS tablet Take 2 tablets (10mg ) twice daily for 7 days, then 1 tablet (5mg ) twice daily (Patient taking differently: 5 mg 2 (two) times daily. )  . aspirin EC 81 MG tablet Take 81 mg by mouth daily.  . calcium carbonate (CALCIUM 600) 600 MG TABS tablet  Take 600 mg by mouth 2 (two) times daily with a meal.  . carbidopa-levodopa (SINEMET IR) 25-100 MG tablet Take by mouth.  . Cholecalciferol (VITAMIN D-1000 MAX ST) 1000 units tablet Take 1,000 Units by mouth daily.  . fluticasone (FLONASE) 50 MCG/ACT nasal spray Place 1 spray into both nostrils daily.  Marland Kitchen gabapentin (NEURONTIN) 100 MG capsule Take 100 mg by mouth at bedtime. Take 1 capsule (100mg ) by mouth every morning and take 2 capsules (200mg ) by mouth every evening  . magnesium oxide (MAG-OX) 400 MG tablet Take 400 mg by mouth daily.  .  megestrol (MEGACE) 40 MG tablet Take 40 mg by mouth daily.  . metroNIDAZOLE (METROGEL) 1 % gel Apply 1 application topically as directed.  . mirtazapine (REMERON) 7.5 MG tablet Take 7.5 mg by mouth at bedtime.  omeprazole (PRILOSEC) 40 MG capsule Take 40 mg by mouth daily.  oxyCODONE (OXY IR/ROXICODONE) 5 MG immediate release tablet Take 1 tablet (5 mg total) by mouth every 8 (eight) hours. Must last 30 days.  Marland Kitchen ON 03/20/2020] oxyCODONE (OXY IR/ROXICODONE) 5 MG immediate release tablet Take 1 tablet (5 mg total) by mouth every 8 (eight) hours. Must last 30 days.  . polyethylene glycol (MIRALAX / GLYCOLAX) 17 g packet Take 17 g by mouth daily.  . Probiotic Product (ALIGN) 4 MG CAPS Take 4 mg by mouth daily.  [name] Propylene Glycol (SYSTANE COMPLETE OP) Place 1 drop into both eyes daily.  13/10/2019 senna-docusate (SENOKOT-S) 8.6-50 MG tablet Take 1 tablet by mouth 2 (two) times daily.  . vitamin B-12 (CYANOCOBALAMIN) 1000 MCG tablet Take 1,000 mcg by mouth daily.  . vitamin k 100 MCG tablet Take 100 mcg by mouth daily.   No facility-administered encounter medications on file as of 03/17/2020.      Undray Allman 08-21-1978, NP

## 2020-03-18 NOTE — Progress Notes (Signed)
Cancelled visit

## 2020-03-30 ENCOUNTER — Other Ambulatory Visit: Payer: Self-pay

## 2020-03-30 ENCOUNTER — Other Ambulatory Visit: Payer: Medicare Other | Admitting: Adult Health Nurse Practitioner

## 2020-03-30 DIAGNOSIS — Z515 Encounter for palliative care: Secondary | ICD-10-CM

## 2020-03-30 DIAGNOSIS — M1990 Unspecified osteoarthritis, unspecified site: Secondary | ICD-10-CM

## 2020-03-30 DIAGNOSIS — M5416 Radiculopathy, lumbar region: Secondary | ICD-10-CM

## 2020-03-30 NOTE — Progress Notes (Signed)
Therapist, nutritional Palliative Care Consult Note Telephone: [phone]  Fax: [phone]  PATIENT NAME: Lauren Roman DOB: Nov 10, 1928 MRN: [id number]  PRIMARY CARE PROVIDER:   Danella Penton, MD  REFERRING PROVIDER:  Danella Penton, MD [phone] [facility] [address] [facility] [place],  [place] [address]  RESPONSIBLE PARTY:  Genella Mech, daughter [phone]  Chief complaint: Efficacy of pain treatment  RECOMMENDATIONS and PLAN: 1.Advanced care planning. Patient is DNR/DNI  2.  Pain.  Daughter had called on 03/26/2020 for refill of oxycodone 5 mg every 6 hours as needed for pain.  States that she has been giving her mother this 3-4 times a day with good relief without having to use the extended release.  Patient does state that she still gets mild to moderate pain in her left shoulder which comes and goes especially with activity.  Checked  substance abuse database with no concerns.  And on 03/26/2020 refilled 30-day supply of oxycodone 5 mg every 6 hours as needed for [facility] [place].  Daughter does state that she has been giving her less of this since she has had joint injections about a week ago.  Discussed that the oxycodone is only as needed and to only give it to her when she is having breakthrough pain.  Daughter encourages her mother to do exercises now that home health therapy has ended.  Have encouraged daughter to make note of when the joint injections were done and how long before her mother starts complaining of pain more consistently again to see how often she may need joint injections.  Will discontinue oxycodone extended release as this did seem to cause her increased drowsiness and she is getting better relief with the joint injections in the oxycodone 5 mg as needed for breakthrough pain.  Palliative will continue to monitor for symptom management/decline and make recommendations as needed.  Next  appointment is in 4 weeks.  Encouraged to call with any questions or concerns.  I spent 40 minutes providing this consultation,  from 3:00 to 3:40 including time spent with patient/family, chart review, provider coordination, documentation. More than 50% of the time in this consultation was spent coordinating communication.   HISTORY OF PRESENT ILLNESS:  SAACHI ZALE is a 86 y.o. year old female with multiple medical problems including Parkinson's disease, MG, HLD, mild dementia, recurrent falls, recurrent UTIs. Palliative Care was asked to help address goals of care.  Daughter states that last week she had collected a urine sample for urinalysis as her mother's urine was dark with a foul odor.  States that she has had a slow reply from PCP office and has asked me to sit check on the results.  Unable to see results in epic.  Daughter states that the culture came back yesterday and she is still waiting for the results from PCP office.  Did discuss that doctors offices are backlogged and has been taking longer to get back to patient's.  Daughter does state that there was noted blood in the urine and that her mother's Eliquis has been stopped.  Daughter does state that she has not been eating as good the patient states that her appetite is good that she just gets full quickly.  Has been supplementing with Ensure.  Patient does have sacral wound which daughter states is improving with the Allevyn patches.  Have encouraged patient to increase protein in her diet for strength and healing.  Denies fever, cough,  increased shortness of breath, N/V/D, constipation.  CODE STATUS: DNR/DNI  PPS: 40% HOSPICE ELIGIBILITY/DIAGNOSIS: TBD  PHYSICAL EXAM:  HR 85 O2 98% on RA General: NAD, frail appearing, thin Cardiovascular: regular rate and rhythm Pulmonary: lung sounds clear; normal respiratory effort Abdomen: soft, nontender, + bowel sounds GU: no suprapubic tenderness Extremities: no edema, no joint  deformities Skin: no rashes on exposed skin Neurological: Weakness; A&O to person and place   PAST MEDICAL HISTORY:  Past Medical History:  Diagnosis Date  . Arthritis   . Dementia (HCC)    mild  . Headache    migraines  . Hepatitis 1952   hep b-no problems since then  . Hyperlipidemia   . Myasthenia gravis (HCC)   . Tremor    right hand    SOCIAL HX:  Social History   Tobacco Use  . Smoking status: Former Smoker    Quit date: 01/30/1963    Years since quitting: 57.2  . Smokeless tobacco: Never Used  . Tobacco comment: only social smoking and not even a full year  Substance Use Topics  . Alcohol use: Never    ALLERGIES:  Allergies  Allergen Reactions  . Exelon [Rivastigmine Tartrate]     Patient unsure of reaction  . Hydrocodone-Acetaminophen Nausea Only    Sometimes make her nausea   . Other Itching and Rash    Anything metal on skin will cause rash and itching     PERTINENT MEDICATIONS:  Outpatient Encounter Medications as of 03/30/2020  Medication Sig  . acetaminophen (TYLENOL) 650 MG CR tablet Take 650 mg by mouth every 8 (eight) hours.   Marland Kitchen alendronate (FOSAMAX) 10 MG tablet Take by mouth daily before breakfast. Yearly infusion  . apixaban (ELIQUIS) 5 MG TABS tablet Take 2 tablets (10mg ) twice daily for 7 days, then 1 tablet (5mg ) twice daily (Patient taking differently: 5 mg 2 (two) times daily. )  . aspirin EC 81 MG tablet Take 81 mg by mouth daily.  . calcium carbonate (CALCIUM 600) 600 MG TABS tablet Take 600 mg by mouth 2 (two) times daily with a meal.  . carbidopa-levodopa (SINEMET IR) 25-100 MG tablet Take by mouth.  . Cholecalciferol (VITAMIN D-1000 MAX ST) 1000 units tablet Take 1,000 Units by mouth daily.  . fluticasone (FLONASE) 50 MCG/ACT nasal spray Place 1 spray into both nostrils daily.  gabapentin (NEURONTIN) 100 MG capsule Take 100 mg by mouth at bedtime. Take 1 capsule (100mg ) by mouth every morning and take 2 capsules (200mg ) by mouth  every evening  . magnesium oxide (MAG-OX) 400 MG tablet Take 400 mg by mouth daily.  . megestrol (MEGACE) 40 MG tablet Take 40 mg by mouth daily.  . metroNIDAZOLE (METROGEL) 1 % gel Apply 1 application topically as directed.  . mirtazapine (REMERON) 7.5 MG tablet Take 7.5 mg by mouth at bedtime.  omeprazole (PRILOSEC) 40 MG capsule Take 40 mg by mouth daily.  Marland Kitchen oxyCODONE (OXY IR/ROXICODONE) 5 MG immediate release tablet Take 1 tablet (5 mg total) by mouth every 8 (eight) hours. Must last 30 days.  . polyethylene glycol (MIRALAX / GLYCOLAX) 17 g packet Take 17 g by mouth daily.  . Probiotic Product (ALIGN) 4 MG CAPS Take 4 mg by mouth daily.  Propylene Glycol (SYSTANE COMPLETE OP) Place 1 drop into both eyes daily.  senna-docusate (SENOKOT-S) 8.6-50 MG tablet Take 1 tablet by mouth 2 (two) times daily.  . vitamin B-12 (CYANOCOBALAMIN) 1000 MCG tablet Take 1,000 mcg  by mouth daily.  . vitamin k 100 MCG tablet Take 100 mcg by mouth daily.   No facility-administered encounter medications on file as of 03/30/2020.     Modesta Sammons Marlena Clipper, NP

## 2020-03-31 ENCOUNTER — Telehealth: Payer: Self-pay

## 2020-03-31 NOTE — Telephone Encounter (Signed)
Patient daughter called stating the patient had hematuria 2 weeks ago, but has since subsided. had UA and culture and her Dr said it was normal. Daughter is concerned and wants to figure out why she is having hematuria. Patient wanted to drop off UA and wants testing as to why she is having bleeding. Advised patients daughter we can not have her drop off a UA offered appointment for today,declined and wanted to come tomorrow. Daughter states patient is currently asymptomatic. She did verbalize concern about her lab work for BUN I advised pt she needs to call her pcp.

## 2020-04-01 ENCOUNTER — Ambulatory Visit: Payer: Self-pay | Admitting: Physician Assistant

## 2020-04-12 ENCOUNTER — Telehealth: Payer: Self-pay | Admitting: *Deleted

## 2020-04-12 NOTE — Telephone Encounter (Signed)
Called for pre VV assessment. Called both numbers on her contact information. LVM on mobile number which I believe is her daughter. Unable to leave VM at home number. VM not set up yet.

## 2020-04-13 ENCOUNTER — Other Ambulatory Visit: Payer: Self-pay

## 2020-04-13 ENCOUNTER — Ambulatory Visit
Payer: Medicare Other | Attending: Student in an Organized Health Care Education/Training Program | Admitting: Student in an Organized Health Care Education/Training Program

## 2020-04-21 ENCOUNTER — Other Ambulatory Visit: Payer: Self-pay

## 2020-04-21 ENCOUNTER — Other Ambulatory Visit: Payer: Medicare Other | Admitting: Adult Health Nurse Practitioner

## 2020-04-21 DIAGNOSIS — M5416 Radiculopathy, lumbar region: Secondary | ICD-10-CM

## 2020-04-21 DIAGNOSIS — Z515 Encounter for palliative care: Secondary | ICD-10-CM

## 2020-04-21 DIAGNOSIS — M1990 Unspecified osteoarthritis, unspecified site: Secondary | ICD-10-CM

## 2020-04-21 NOTE — Progress Notes (Signed)
Therapist, nutritional Palliative Care Consult Note Telephone: [phone]  Fax: [phone]  PATIENT NAME: Lauren Roman DOB: 10-19-1928 MRN: [id number]  PRIMARY CARE PROVIDER:   Danella Penton, MD  REFERRING PROVIDER:  Danella Penton, MD [phone] [facility] [address] [facility] [address],  [place] [address]  RESPONSIBLE PARTY:   Lauren Roman, daughter [phone]  Chief complaint: pain  Due to the COVID-19 crisis, this visit was done via telemedicine and it was initiated and consent by this patient and or family. Video-audio (telehealth) contact was unable to be done due to technical barriers from the patient's side.    RECOMMENDATIONS and PLAN:  1. Advanced care planning. Patient is DNR/DNI  2.  Pain related to lumbar radiculopathy and chronic osteoarthritis.  Patient had previously been using Xtampza ER 13.5 mg twice daily.  Daughter had started just using the oxycodone 5 mg as needed and felt like her mother was getting good relief.  Started having increased pain and getting better relief with the [facility] ER.  Checked [place] substance abuse database with no concerns.  Sent in the prescription for Xtampza ER 13.5 mg every 12 hours for [facility] [place].  Palliative will continue to monitor for symptom management/decline and make recommendations as needed.  Do have follow-up visit scheduled for this coming Monday, 04/26/2020.  Daughter encouraged to call with any questions or concerns.  I spent 25 minutes providing this consultation. More than 50% of the time in this consultation was spent coordinating communication.   HISTORY OF PRESENT ILLNESS:  Lauren Roman is a 86 y.o. year old female with multiple medical problems including Parkinson's disease, MG, HLD, mild dementia, recurrent falls, recurrent UTIs. Palliative Care was asked to help address goals of care. Patient having increased back pain over the past 3 days.   Rates the pain a 7/10 on pain scale.  Have tried using Xtampza 13.5 mg yesterday morning and this morning with better relief.  Daughter states that they only have one more left and asking for a refill.  Patient has complained of some nausea and dizziness today.  Daughter does state that she is having some increased confusion such as wanting to go home or back to her bed when she is already at home in her own bed.    CODE STATUS: DNR/DNI  PPS: 40% HOSPICE ELIGIBILITY/DIAGNOSIS: TBD  PHYSICAL EXAM:   Deferred  PAST MEDICAL HISTORY:  Past Medical History:  Diagnosis Date  . Arthritis   . Dementia (HCC)    mild  . Headache    migraines  . Hepatitis 1952   hep b-no problems since then  . Hyperlipidemia   . Myasthenia gravis (HCC)   . Tremor    right hand    SOCIAL HX:  Social History   Tobacco Use  . Smoking status: Former Smoker    Quit date: 01/30/1963    Years since quitting: 57.2  . Smokeless tobacco: Never Used  . Tobacco comment: only social smoking and not even a full year  Substance Use Topics  . Alcohol use: Never    ALLERGIES:  Allergies  Allergen Reactions  . Exelon [Rivastigmine Tartrate]     Patient unsure of reaction  . Hydrocodone-Acetaminophen Nausea Only    Sometimes make her nausea   . Other Itching and Rash    Anything metal on skin will cause rash and itching     PERTINENT MEDICATIONS:  Outpatient Encounter Medications  as of 04/21/2020  Medication Sig  . acetaminophen (TYLENOL) 650 MG CR tablet Take 650 mg by mouth every 8 (eight) hours.   Marland Kitchen alendronate (FOSAMAX) 10 MG tablet Take by mouth daily before breakfast. Yearly infusion  . apixaban (ELIQUIS) 5 MG TABS tablet Take 2 tablets (10mg ) twice daily for 7 days, then 1 tablet (5mg ) twice daily (Patient taking differently: 5 mg 2 (two) times daily. )  . aspirin EC 81 MG tablet Take 81 mg by mouth daily.  . calcium carbonate (CALCIUM 600) 600 MG TABS tablet Take 600 mg by mouth 2 (two) times daily  with a meal.  . carbidopa-levodopa (SINEMET IR) 25-100 MG tablet Take by mouth.  . Cholecalciferol (VITAMIN D-1000 MAX ST) 1000 units tablet Take 1,000 Units by mouth daily.  . fluticasone (FLONASE) 50 MCG/ACT nasal spray Place 1 spray into both nostrils daily.  gabapentin (NEURONTIN) 100 MG capsule Take 100 mg by mouth at bedtime. Take 1 capsule (100mg ) by mouth every morning and take 2 capsules (200mg ) by mouth every evening  . magnesium oxide (MAG-OX) 400 MG tablet Take 400 mg by mouth daily.  . megestrol (MEGACE) 40 MG tablet Take 40 mg by mouth daily.  . metroNIDAZOLE (METROGEL) 1 % gel Apply 1 application topically as directed.  . mirtazapine (REMERON) 7.5 MG tablet Take 7.5 mg by mouth at bedtime.  omeprazole (PRILOSEC) 40 MG capsule Take 40 mg by mouth daily.  . polyethylene glycol (MIRALAX / GLYCOLAX) 17 g packet Take 17 g by mouth daily.  . Probiotic Product (ALIGN) 4 MG CAPS Take 4 mg by mouth daily.  Marland Kitchen Propylene Glycol (SYSTANE COMPLETE OP) Place 1 drop into both eyes daily.  senna-docusate (SENOKOT-S) 8.6-50 MG tablet Take 1 tablet by mouth 2 (two) times daily.  . vitamin B-12 (CYANOCOBALAMIN) 1000 MCG tablet Take 1,000 mcg by mouth daily.  . vitamin k 100 MCG tablet Take 100 mcg by mouth daily.   No facility-administered encounter medications on file as of 04/21/2020.      Demitrius Crass Marland Kitchen, NP

## 2020-04-26 ENCOUNTER — Other Ambulatory Visit: Payer: Medicare Other | Admitting: Adult Health Nurse Practitioner

## 2020-04-26 ENCOUNTER — Other Ambulatory Visit: Payer: Self-pay

## 2020-04-26 DIAGNOSIS — M1990 Unspecified osteoarthritis, unspecified site: Secondary | ICD-10-CM

## 2020-04-26 DIAGNOSIS — Z515 Encounter for palliative care: Secondary | ICD-10-CM

## 2020-04-26 DIAGNOSIS — M5416 Radiculopathy, lumbar region: Secondary | ICD-10-CM

## 2020-04-26 DIAGNOSIS — G20 Parkinson's disease: Secondary | ICD-10-CM

## 2020-04-26 NOTE — Progress Notes (Signed)
Therapist, nutritional Palliative Care Consult Note Telephone: [phone]  Fax: [phone]  PATIENT NAME: Lauren Roman DOB: February 28, 1929 MRN: [id number]  PRIMARY CARE PROVIDER:   Danella Penton, MD  REFERRING PROVIDER:  Danella Penton, MD [phone] [facility] [address] [facility] [place],  [place] [address]  RESPONSIBLE PARTY:   Genella Mech, daughter [phone]  Chief complaint: pain  RECOMMENDATIONS and PLAN: 1.Advanced care planning. Patient is DNR/DNI  2. Pain. Will try Xtampza 9 mg Q 12 hrs for one week to see if this will help without causing too much drowsiness.  Have discussed giving with the oxycodone IR for better pain relief.  Sent in escript for Xtampza 9 mg Q12 hours to [facility].  Checked [place] substance abuse data base with no concerns.    3.  Parkinson's.  Patient having increased hallucinations.  These are not frightening to the patient.  Discussed that this is not uncommon in Parkinson's patients but treatment options are very limited due to interactions with sinemet.  Discussed that since the hallucinations are not frightening to the patient that it may be best to not add any other medications that would make her more drowsy and increase he risk of falls.  She does have a neurology appointment tomorrow.  Continue follow up and recommendations by neurology.  Palliative will continue to monitor for symptom management/decline and make recommendations as needed.  Follow up visit in one week. Encouraged to call with any questions or concerns   I spent 40 minutes providing this consultation. More than 50% of the time in this consultation was spent coordinating communication.   HISTORY OF PRESENT ILLNESS:  Lauren Roman is a 86 y.o. year old female with multiple medical problems including Parkinson's disease, MG, HLD, mild dementia, recurrent falls, recurrent UTIs. Palliative Care was asked to help address goals of care.   Patient has tried Xtampza 13.5 but this has been making her drowsy. Has been getting somewhat better relief when she takes her oxycodone 5mg  PRN Q 6 hours.  Daughter states that sometimes she has to give it to her earlier.  Have tried Xtampza 9 mg Q12 hrs but was not getting adequate relief.  Believe may not have been given regularly to see good pain relief.   Daughter states that she has been having increased visual hallucinations in the context of her Parkinson's over the past month or more.  Has been seeing men standing in the room or objects in pictures moving.  These do not frighten her.  Nothing has been tried to relieve them.   Patient has occasional dark urine.  Last UA did not show UTI.  Daughter does state that she probably does not drink enough fluids.  Denies dysuria, hematuria, N/V/D, constipation, fever.    CODE STATUS: DNR  PPS: 40% HOSPICE ELIGIBILITY/DIAGNOSIS: TBD  PHYSICAL EXAM:   General: NAD, frail appearing, thin Eyes: sclera anicteric and noninjected with no discharge noted ENMT: moist mucous membranes Extremities: no edema, no joint deformities Skin: no rashes on exposed skin Neurological: Weakness; A&O to person and place  PAST MEDICAL HISTORY:  Past Medical History:  Diagnosis Date  . Arthritis   . Dementia (HCC)    mild  . Headache    migraines  . Hepatitis 1952   hep b-no problems since then  . Hyperlipidemia   . Myasthenia gravis (HCC)   . Tremor    right hand    SOCIAL HX:  Social History  Tobacco Use  . Smoking status: Former Smoker    Quit date: 01/30/1963    Years since quitting: 57.2  . Smokeless tobacco: Never Used  . Tobacco comment: only social smoking and not even a full year  Substance Use Topics  . Alcohol use: Never    ALLERGIES:  Allergies  Allergen Reactions  . Exelon [Rivastigmine Tartrate]     Patient unsure of reaction  . Hydrocodone-Acetaminophen Nausea Only    Sometimes make her nausea   . Other Itching and Rash     Anything metal on skin will cause rash and itching     PERTINENT MEDICATIONS:  Outpatient Encounter Medications as of 04/26/2020  Medication Sig  . acetaminophen (TYLENOL) 650 MG CR tablet Take 650 mg by mouth every 8 (eight) hours.   Marland Kitchen alendronate (FOSAMAX) 10 MG tablet Take by mouth daily before breakfast. Yearly infusion  . apixaban (ELIQUIS) 5 MG TABS tablet Take 2 tablets (10mg ) twice daily for 7 days, then 1 tablet (5mg ) twice daily (Patient taking differently: 5 mg 2 (two) times daily. )  . aspirin EC 81 MG tablet Take 81 mg by mouth daily.  . calcium carbonate (CALCIUM 600) 600 MG TABS tablet Take 600 mg by mouth 2 (two) times daily with a meal.  . carbidopa-levodopa (SINEMET IR) 25-100 MG tablet Take by mouth.  . Cholecalciferol (VITAMIN D-1000 MAX ST) 1000 units tablet Take 1,000 Units by mouth daily.  . fluticasone (FLONASE) 50 MCG/ACT nasal spray Place 1 spray into both nostrils daily.  gabapentin (NEURONTIN) 100 MG capsule Take 100 mg by mouth at bedtime. Take 1 capsule (100mg ) by mouth every morning and take 2 capsules (200mg ) by mouth every evening  . magnesium oxide (MAG-OX) 400 MG tablet Take 400 mg by mouth daily.  . megestrol (MEGACE) 40 MG tablet Take 40 mg by mouth daily.  . metroNIDAZOLE (METROGEL) 1 % gel Apply 1 application topically as directed.  . mirtazapine (REMERON) 7.5 MG tablet Take 7.5 mg by mouth at bedtime.  omeprazole (PRILOSEC) 40 MG capsule Take 40 mg by mouth daily.  . polyethylene glycol (MIRALAX / GLYCOLAX) 17 g packet Take 17 g by mouth daily.  . Probiotic Product (ALIGN) 4 MG CAPS Take 4 mg by mouth daily.  Marland Kitchen Propylene Glycol (SYSTANE COMPLETE OP) Place 1 drop into both eyes daily.  senna-docusate (SENOKOT-S) 8.6-50 MG tablet Take 1 tablet by mouth 2 (two) times daily.  . vitamin B-12 (CYANOCOBALAMIN) 1000 MCG tablet Take 1,000 mcg by mouth daily.  . vitamin k 100 MCG tablet Take 100 mcg by mouth daily.   No facility-administered encounter  medications on file as of 04/26/2020.     Gillian Kluever Marland Kitchen, NP

## 2020-05-04 ENCOUNTER — Other Ambulatory Visit: Payer: Medicare Other | Admitting: Adult Health Nurse Practitioner

## 2020-05-04 ENCOUNTER — Other Ambulatory Visit: Payer: Self-pay

## 2020-05-04 DIAGNOSIS — F03A0 Unspecified dementia, mild, without behavioral disturbance, psychotic disturbance, mood disturbance, and anxiety: Secondary | ICD-10-CM

## 2020-05-04 DIAGNOSIS — F0390 Unspecified dementia without behavioral disturbance: Secondary | ICD-10-CM

## 2020-05-04 DIAGNOSIS — Z515 Encounter for palliative care: Secondary | ICD-10-CM

## 2020-05-04 DIAGNOSIS — M1990 Unspecified osteoarthritis, unspecified site: Secondary | ICD-10-CM

## 2020-05-04 DIAGNOSIS — M5416 Radiculopathy, lumbar region: Secondary | ICD-10-CM

## 2020-05-04 NOTE — Progress Notes (Signed)
Therapist, nutritional Palliative Care Consult Note Telephone: [phone]  Fax: [phone]  PATIENT NAME: Lauren Roman DOB: 16-Apr-1929 MRN: [id number]  PRIMARY CARE PROVIDER:   Danella Penton, MD  REFERRING PROVIDER:  Danella Penton, MD [phone] [facility] [address] [facility] [place],  [place] [address]  RESPONSIBLE PARTY:   Genella Mech, daughter [phone]  Chief complaint: pain  RECOMMENDATIONS and PLAN: 1.Advanced care planning. Patient is DNR/DNI  2.  Chronic pain.  Ordered 30 day supply of Xtampza 9 mg as it is cheaper than ordering just a one week supply.  Will reevaluate pain in 3 weeks.  3.  Dementia.  Patient having increased weakness, confusion, hallucinations, and decreased appetite.  Patient could be having progression of her dementia.  Daughter states that the episodes of increased confusion and hallucinations do come on suddenly which could indicate infection.  Since it is a hardship to get her mother out to the PCP office or urgent for evaluation have suggested that if she continues to have these episodes to have her mother evaluated.  Also discussed the if nothing concerning is found that she may be having progression of disease.  Discussed possible transition to hospice if that were the case.    Palliative will continue to monitor for symptom management/decline and make recommendations as needed.  Follow up visit in 3 weeks. Encouraged to call with any questions or concerns   I spent 60 minutes providing this consultation. More than 50% of the time in this consultation was spent coordinating communication.   HISTORY OF PRESENT ILLNESS:  Lauren Roman is a 86 y.o. year old female with multiple medical problems including Parkinson's disease, MG, HLD, mild dementia, recurrent falls, recurrent UTIs. Palliative Care was asked to help address goals of care. Daughter states that her mother does seem to be complaining about pain  less. We will give her oxycodone 5 mg as needed when she does complain of pain. Daughter states that on Friday she was having a increased confusion and hallucinations. Has had similar episode about a month ago. Does state that yesterday her mother had an episode of nausea and vomiting. Has not been eating much of anything since then. Though she does seem to be having less confusion and hallucinations today. Daughter has reached out to PCP and her mother was having increased confusion and hallucinations. Have suggested picking up cup for urinary sample. Getting patient out of the house for doctor's visits can be a hardship as she is becoming more sedentary. Daughter does state that for about a week now she has not walked.  Daughter states that for a while now she has just been eating bites and taking sips.  She feels her mother is losing weight as her clothes are getting looser and she can tell she is not as heavy when doing her care.  Denies fever, headaches, diarrhea, constipation, dysuria, hematuria.  Does state that her urine was dark about a month ago.    CODE STATUS: DNR/DNI  PPS: 40% HOSPICE ELIGIBILITY/DIAGNOSIS: TBD  PHYSICAL EXAM:  BP 116/58 HR 83 O2 98% on room air temp 98.1 General: NAD, frail appearing, thin Eyes: Sclera anicteric and noninjected with no discharge noted Cardiovascular: regular rate and rhythm Pulmonary: Lung sounds clear; normal respiratory effort Abdomen: soft, nontender, + bowel sounds Extremities: no edema, no joint deformities Skin: no rashes on exposed skin Neurological: Weakness but otherwise nonfocal   PAST MEDICAL HISTORY:  Past Medical History:  Diagnosis  Date  . Arthritis   . Dementia (HCC)    mild  . Headache    migraines  . Hepatitis 1952   hep b-no problems since then  . Hyperlipidemia   . Myasthenia gravis (HCC)   . Tremor    right hand    SOCIAL HX:  Social History   Tobacco Use  . Smoking status: Former Smoker    Quit date:  01/30/1963    Years since quitting: 57.2  . Smokeless tobacco: Never Used  . Tobacco comment: only social smoking and not even a full year  Substance Use Topics  . Alcohol use: Never    ALLERGIES:  Allergies  Allergen Reactions  . Exelon [Rivastigmine Tartrate]     Patient unsure of reaction  . Hydrocodone-Acetaminophen Nausea Only    Sometimes make her nausea   . Other Itching and Rash    Anything metal on skin will cause rash and itching     PERTINENT MEDICATIONS:  Outpatient Encounter Medications as of 05/04/2020  Medication Sig  . acetaminophen (TYLENOL) 650 MG CR tablet Take 650 mg by mouth every 8 (eight) hours.   Marland Kitchen alendronate (FOSAMAX) 10 MG tablet Take by mouth daily before breakfast. Yearly infusion  . apixaban (ELIQUIS) 5 MG TABS tablet Take 2 tablets (10mg ) twice daily for 7 days, then 1 tablet (5mg ) twice daily (Patient taking differently: 5 mg 2 (two) times daily. )  . aspirin EC 81 MG tablet Take 81 mg by mouth daily.  . calcium carbonate (CALCIUM 600) 600 MG TABS tablet Take 600 mg by mouth 2 (two) times daily with a meal.  . carbidopa-levodopa (SINEMET IR) 25-100 MG tablet Take by mouth.  . Cholecalciferol (VITAMIN D-1000 MAX ST) 1000 units tablet Take 1,000 Units by mouth daily.  . fluticasone (FLONASE) 50 MCG/ACT nasal spray Place 1 spray into both nostrils daily.  gabapentin (NEURONTIN) 100 MG capsule Take 100 mg by mouth at bedtime. Take 1 capsule (100mg ) by mouth every morning and take 2 capsules (200mg ) by mouth every evening  . magnesium oxide (MAG-OX) 400 MG tablet Take 400 mg by mouth daily.  . megestrol (MEGACE) 40 MG tablet Take 40 mg by mouth daily.  . metroNIDAZOLE (METROGEL) 1 % gel Apply 1 application topically as directed.  . mirtazapine (REMERON) 7.5 MG tablet Take 7.5 mg by mouth at bedtime.  omeprazole (PRILOSEC) 40 MG capsule Take 40 mg by mouth daily.  . polyethylene glycol (MIRALAX / GLYCOLAX) 17 g packet Take 17 g by mouth daily.  .  Probiotic Product (ALIGN) 4 MG CAPS Take 4 mg by mouth daily.  Marland Kitchen Propylene Glycol (SYSTANE COMPLETE OP) Place 1 drop into both eyes daily.  senna-docusate (SENOKOT-S) 8.6-50 MG tablet Take 1 tablet by mouth 2 (two) times daily.  . vitamin B-12 (CYANOCOBALAMIN) 1000 MCG tablet Take 1,000 mcg by mouth daily.  . vitamin k 100 MCG tablet Take 100 mcg by mouth daily.   No facility-administered encounter medications on file as of 05/04/2020.     Amy Marland Kitchen, NP

## 2020-05-24 ENCOUNTER — Telehealth: Payer: Self-pay | Admitting: Adult Health Nurse Practitioner

## 2020-05-24 NOTE — Telephone Encounter (Signed)
Spoke with daughter to see if I could change the time of the Palliative f/u visit scheduled on 05/25/20 @ 3:30 to 1:30 PM and she was in agreement with this.

## 2020-05-25 ENCOUNTER — Other Ambulatory Visit: Payer: Medicare Other | Admitting: Adult Health Nurse Practitioner

## 2020-05-25 ENCOUNTER — Other Ambulatory Visit: Payer: Self-pay

## 2020-05-25 DIAGNOSIS — F03A0 Unspecified dementia, mild, without behavioral disturbance, psychotic disturbance, mood disturbance, and anxiety: Secondary | ICD-10-CM

## 2020-05-25 DIAGNOSIS — G20 Parkinson's disease: Secondary | ICD-10-CM

## 2020-05-25 DIAGNOSIS — F0390 Unspecified dementia without behavioral disturbance: Secondary | ICD-10-CM

## 2020-05-25 DIAGNOSIS — Z515 Encounter for palliative care: Secondary | ICD-10-CM

## 2020-05-25 DIAGNOSIS — M5416 Radiculopathy, lumbar region: Secondary | ICD-10-CM

## 2020-05-25 NOTE — Progress Notes (Signed)
Therapist, nutritional Palliative Care Consult Note Telephone: [phone]  Fax: [phone]  PATIENT NAME: Lauren Roman DOB: 24-Mar-1929 MRN: [id number]  PRIMARY CARE PROVIDER:   Danella Penton, MD  REFERRING PROVIDER:  Danella Penton, MD [phone] [facility] [address] [facility] [place],  [place] [address]  RESPONSIBLE PARTY:  Genella Mech, daughter [phone]  Chief complaint:pain/debility  Patient called yesterday to confirm appointment.  COVID screening done.  RECOMMENDATIONS and PLAN: 1.Advanced care planning. Patient is DNR/DNI  2.  Chronic pain.  Have suggested taking Xtampza 13.5 mg in the AM and 9mg  at bedtime.  Daughter in agreement with this.  Does not need any refills at this time.    3.  Parkinson's/dementia.  Patient is having functional decline.  Have encouraged increasing fluid intake as dehydration can cause weakness and dizziness.  Patient not eating as much and is losing weight.  It is taking her longer to eat a meal.  She is incontinent of B&B. She has had pressure wounds to sacrum in the past but currently does not have any wounds.  Discussed hospice versus pursuing PT.  Daughter would like to continue with PT at this time.    Palliative will continue to monitor for symptom management/decline and make recommendations as needed.  Follow up in 4 weeks.  Encouraged to call with any questions or concerns.  I spent 60 minutes providing this consultation. More than 50% of the time in this consultation was spent coordinating communication.   HISTORY OF PRESENT ILLNESS:  Lauren Roman is a 87 y.o. year old female with multiple medical problems including Parkinson's disease, MG, HLD, mild dementia, recurrent falls, recurrent UTIs. Palliative Care was asked to help address goals of care. Patient has been having more back pain in the mornings.  Daughter states that the Xtampza 13.5 mg gives good relief.  Most other times gives her  the 9 mg Xtampza.  She has not needed any of her oxycodone IR PRN with the Xtampza given BID.  Daughter states that her mother is losing weight.  Unsure how much because she cannot get on the scales.  Notices her clothes and briefs are looser.  She does not eat as much and daughter is trying to encourage smaller more frequent meals.  Patient does complain of dizziness and weakness and she does get recurrent UTIs.  Daughter states that she does not drink enough water.  Discussed signs and symptoms of dehydration and encouraged drinking more water.   Patient has not been wanting to get up and walk or do her exercises like she used to.  She had virtual visit with her PCP today and daughter states that PT through [facility] was ordered.     CODE STATUS: DNR/DNI  PPS: 30% HOSPICE ELIGIBILITY/DIAGNOSIS: TBD  PAST MEDICAL HISTORY:  Past Medical History:  Diagnosis Date  . Arthritis   . Dementia (HCC)    mild  . Headache    migraines  . Hepatitis 1952   hep b-no problems since then  . Hyperlipidemia   . Myasthenia gravis (HCC)   . Tremor    right hand    SOCIAL HX:  Social History   Tobacco Use  . Smoking status: Former Smoker    Quit date: 01/30/1963    Years since quitting: 57.3  . Smokeless tobacco: Never Used  . Tobacco comment: only social smoking and not even a full year  Substance Use Topics  . Alcohol use:  Never    ALLERGIES:  Allergies  Allergen Reactions  . Exelon [Rivastigmine Tartrate]     Patient unsure of reaction  . Hydrocodone-Acetaminophen Nausea Only    Sometimes make her nausea   . Other Itching and Rash    Anything metal on skin will cause rash and itching     PERTINENT MEDICATIONS:  Outpatient Encounter Medications as of 05/25/2020  Medication Sig  . acetaminophen (TYLENOL) 650 MG CR tablet Take 650 mg by mouth every 8 (eight) hours.   Marland Kitchen alendronate (FOSAMAX) 10 MG tablet Take by mouth daily before breakfast. Yearly infusion  . apixaban  (ELIQUIS) 5 MG TABS tablet Take 2 tablets (10mg ) twice daily for 7 days, then 1 tablet (5mg ) twice daily (Patient taking differently: 5 mg 2 (two) times daily. )  . aspirin EC 81 MG tablet Take 81 mg by mouth daily.  . calcium carbonate (CALCIUM 600) 600 MG TABS tablet Take 600 mg by mouth 2 (two) times daily with a meal.  . carbidopa-levodopa (SINEMET IR) 25-100 MG tablet Take by mouth.  . Cholecalciferol (VITAMIN D-1000 MAX ST) 1000 units tablet Take 1,000 Units by mouth daily.  . fluticasone (FLONASE) 50 MCG/ACT nasal spray Place 1 spray into both nostrils daily.  gabapentin (NEURONTIN) 100 MG capsule Take 100 mg by mouth at bedtime. Take 1 capsule (100mg ) by mouth every morning and take 2 capsules (200mg ) by mouth every evening  . magnesium oxide (MAG-OX) 400 MG tablet Take 400 mg by mouth daily.  . megestrol (MEGACE) 40 MG tablet Take 40 mg by mouth daily.  . metroNIDAZOLE (METROGEL) 1 % gel Apply 1 application topically as directed.  . mirtazapine (REMERON) 7.5 MG tablet Take 7.5 mg by mouth at bedtime.  omeprazole (PRILOSEC) 40 MG capsule Take 40 mg by mouth daily.  . polyethylene glycol (MIRALAX / GLYCOLAX) 17 g packet Take 17 g by mouth daily.  . Probiotic Product (ALIGN) 4 MG CAPS Take 4 mg by mouth daily.  Marland Kitchen Propylene Glycol (SYSTANE COMPLETE OP) Place 1 drop into both eyes daily.  senna-docusate (SENOKOT-S) 8.6-50 MG tablet Take 1 tablet by mouth 2 (two) times daily.  . vitamin B-12 (CYANOCOBALAMIN) 1000 MCG tablet Take 1,000 mcg by mouth daily.  . vitamin k 100 MCG tablet Take 100 mcg by mouth daily.   No facility-administered encounter medications on file as of 05/25/2020.    PHYSICAL EXAM:  BP 120/68  HR 90 O2 99% on RA  Temp. 98.1 General: NAD, frail appearing, thin Cardiovascular: regular rate and rhythm Pulmonary: clear ant fields Abdomen: soft, nontender, + bowel sounds GU: no suprapubic tenderness Extremities: no edema, no joint deformities Skin: no  rashes Neurological: Weakness but otherwise nonfocal  Rogene Meth Marland Kitchen, NP

## 2020-06-11 ENCOUNTER — Telehealth: Payer: Self-pay | Admitting: Adult Health Nurse Practitioner

## 2020-06-11 NOTE — Telephone Encounter (Signed)
Returned VM from daughter.  Was unable to hear whole message as it was fading in and out.  Left 2 VMs to call back. Lidya Mccalister K. Garner Nash NP

## 2020-06-16 ENCOUNTER — Telehealth: Payer: Self-pay | Admitting: Adult Health Nurse Practitioner

## 2020-06-16 NOTE — Telephone Encounter (Signed)
This is late entry for 06/15/20. Attempted to return patient's VM requesting refill on Xtampza 13.5 mg BID for her mother.  Checked [place] substance abuse data base with no concerns.  VM box full so unable to leave message.  Padraig Nhan K. Garner Nash NP

## 2020-06-22 ENCOUNTER — Other Ambulatory Visit: Payer: Medicare Other | Admitting: Adult Health Nurse Practitioner

## 2020-06-22 ENCOUNTER — Telehealth: Payer: Self-pay | Admitting: Adult Health Nurse Practitioner

## 2020-06-22 ENCOUNTER — Other Ambulatory Visit: Payer: Self-pay

## 2020-06-22 NOTE — Telephone Encounter (Signed)
Arrived at home for scheduled visit.  No answer at the door.  Left VM with reason for call and call back info to reschedule. Amahia Madonia K. Garner Nash NP

## 2020-06-23 IMAGING — CR CHEST  1 VIEW
1 series · 1 of 1 positions shown · non-contrast
Comparison: None.

CLINICAL DATA: Left hip pain status post fall. Pain status post
fall.

EXAM:
CHEST  1 VIEW

[chest pa]
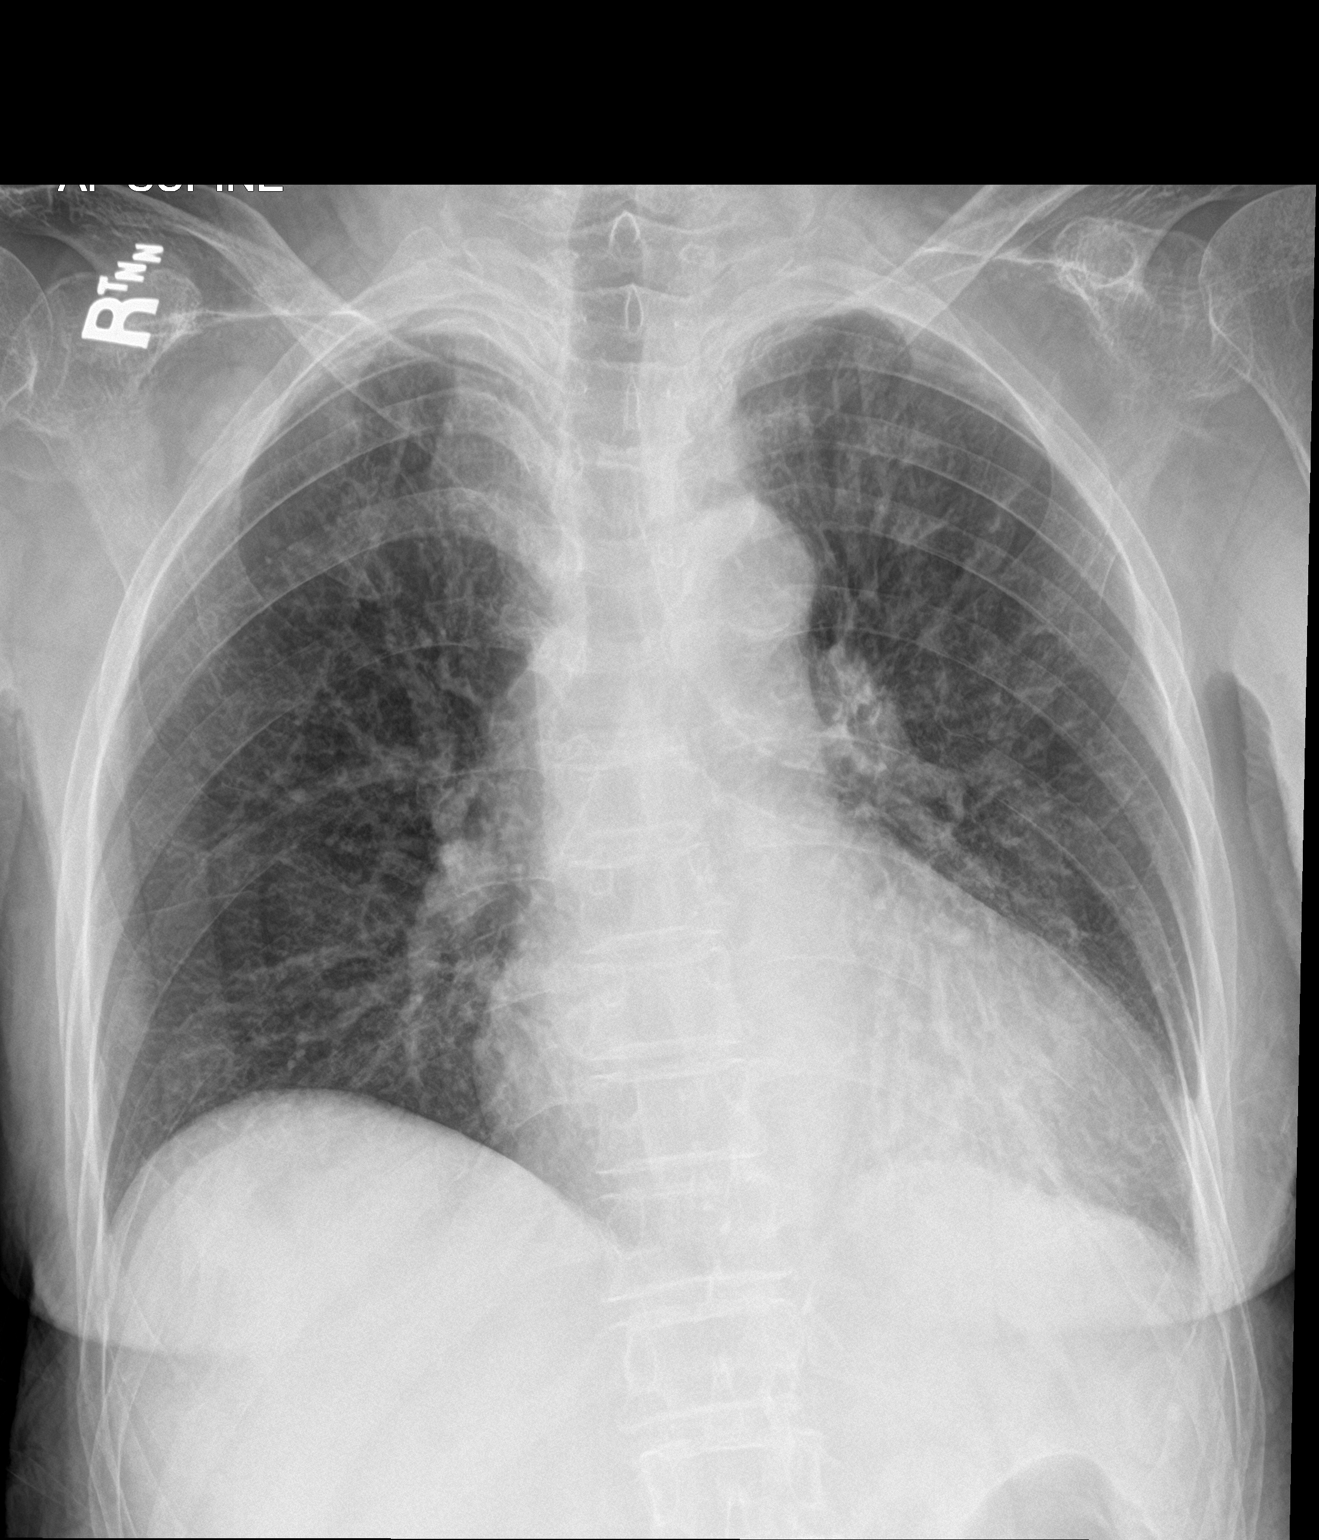

[1 of 1 positions shown; findings below may reference images not displayed]

FINDINGS: There is no pneumothorax. No displaced rib fracture. The cardiac
silhouette is enlarged. There is no focal consolidation.
IMPRESSION: 1. No acute cardiopulmonary process identified.
2. Cardiomegaly.

## 2020-06-23 IMAGING — CR DG HIP (WITH OR WITHOUT PELVIS) 2-3V LEFT
3 series · 3 of 3 positions shown · non-contrast
Comparison: None.

CLINICAL DATA: Hip pain

EXAM:
DG HIP (WITH OR WITHOUT PELVIS) 2-3V LEFT

[pelvis ap]
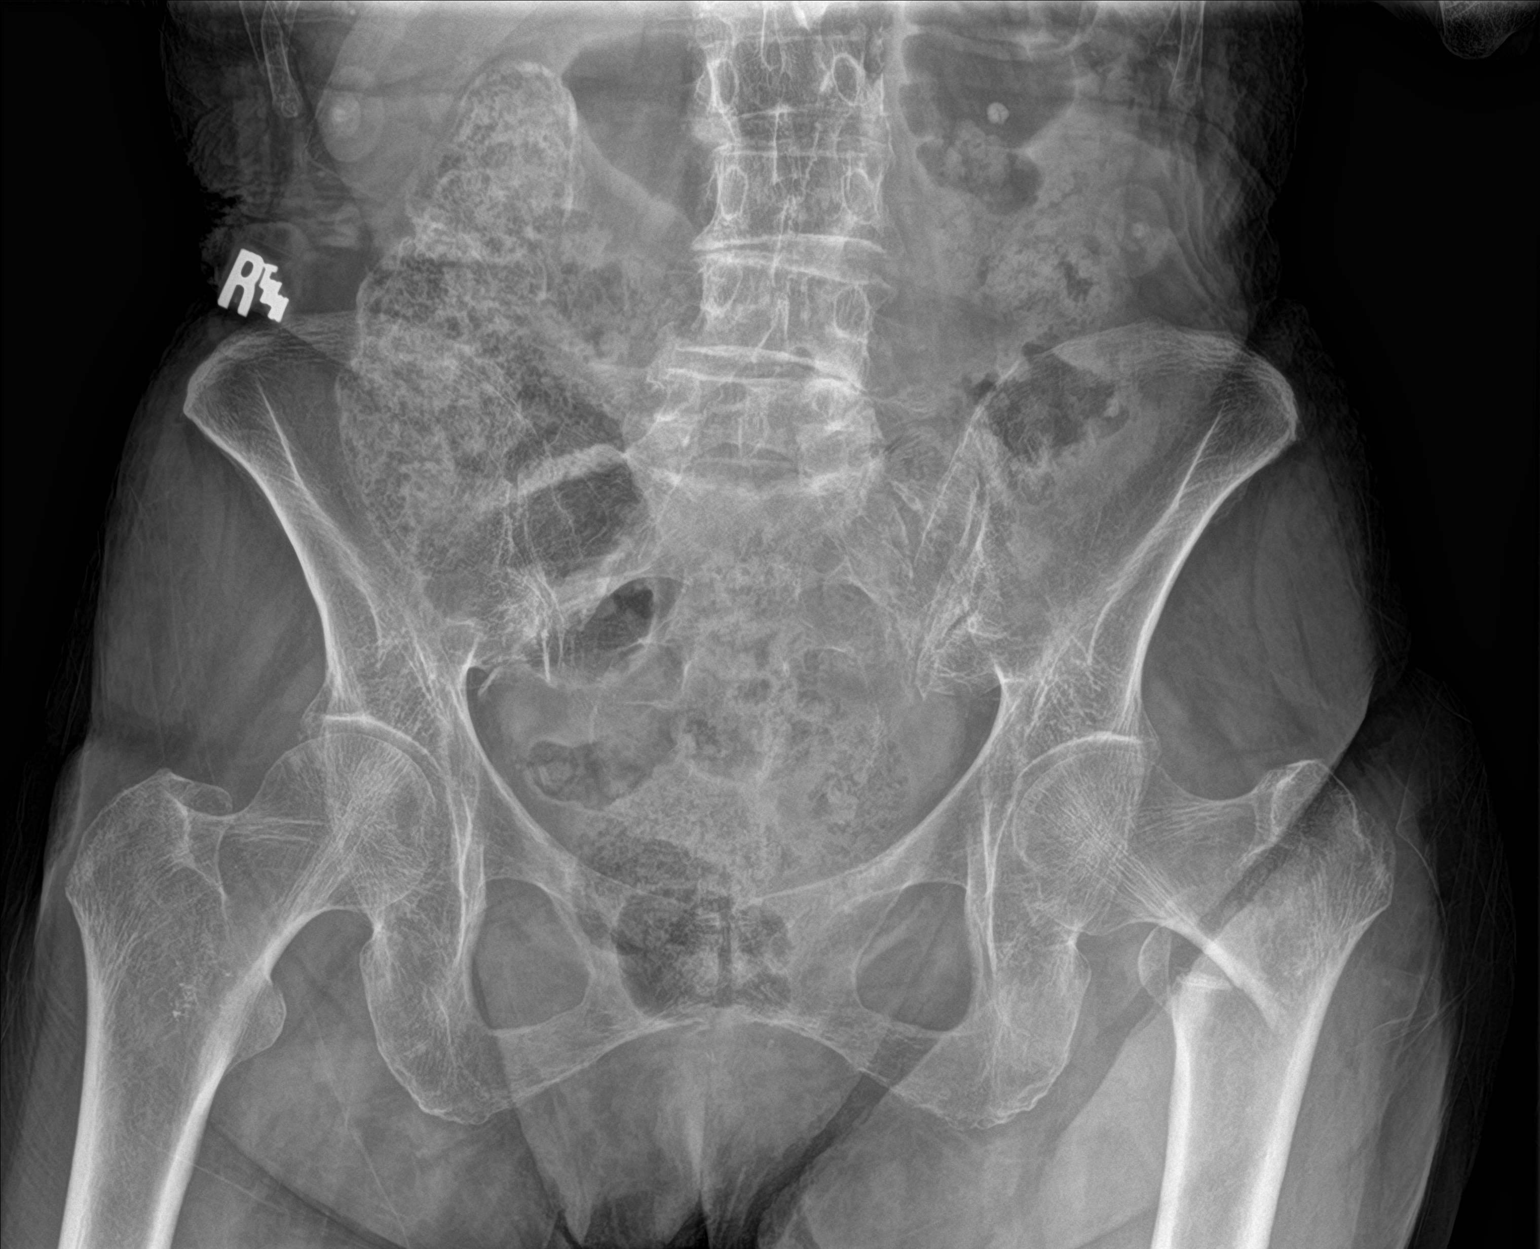

[hip ap]
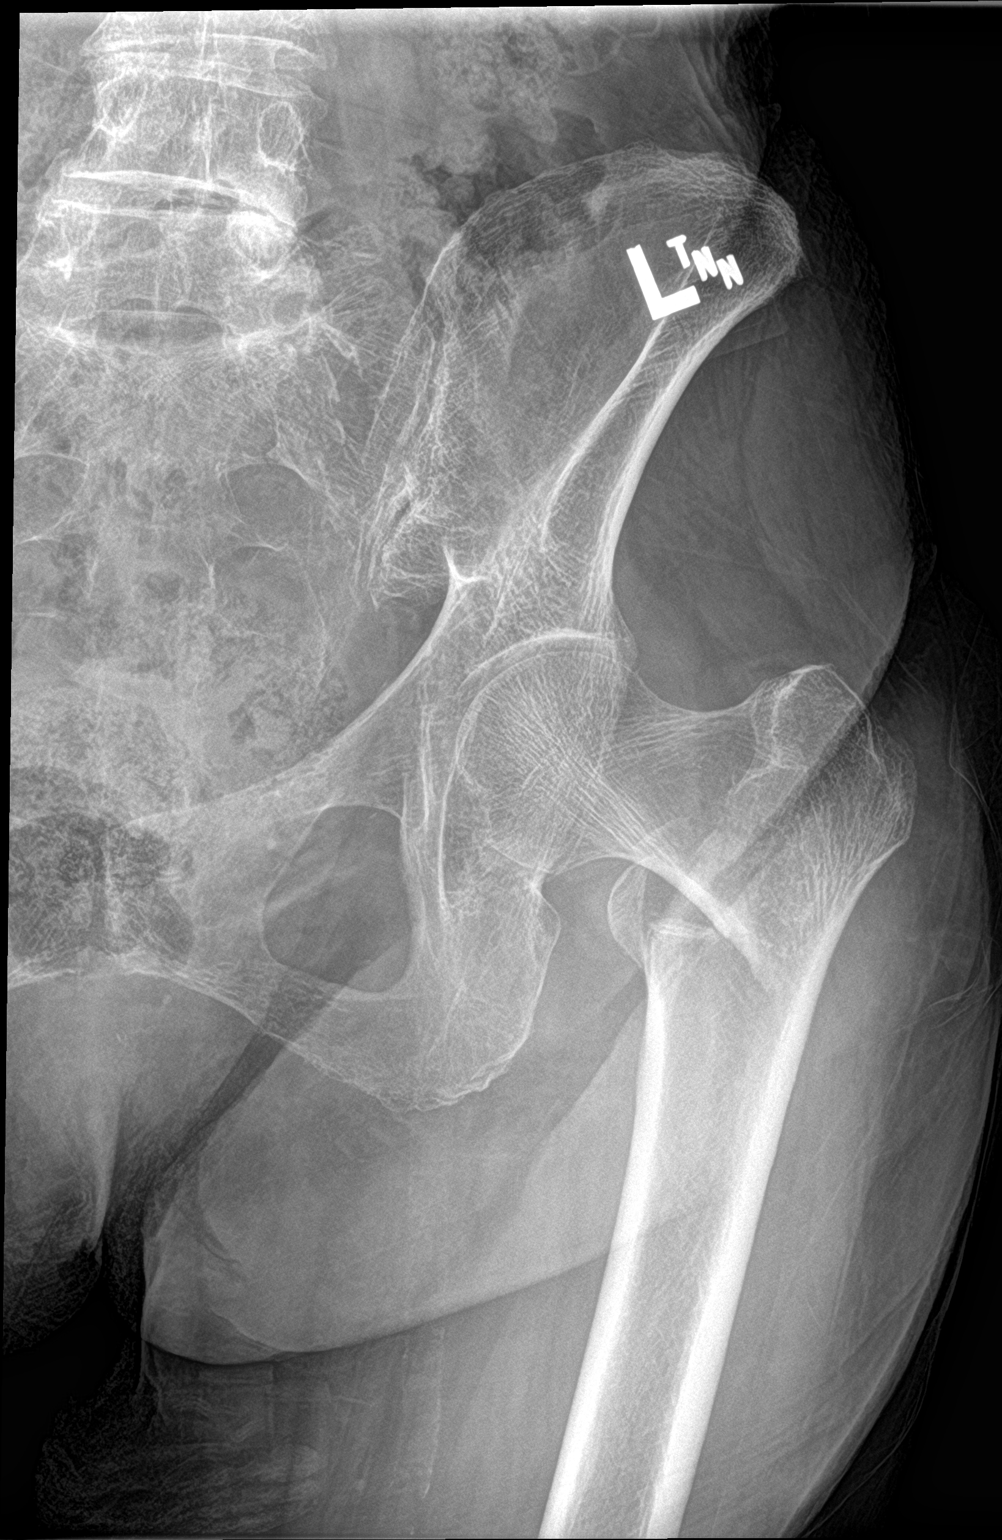

[hip lat]
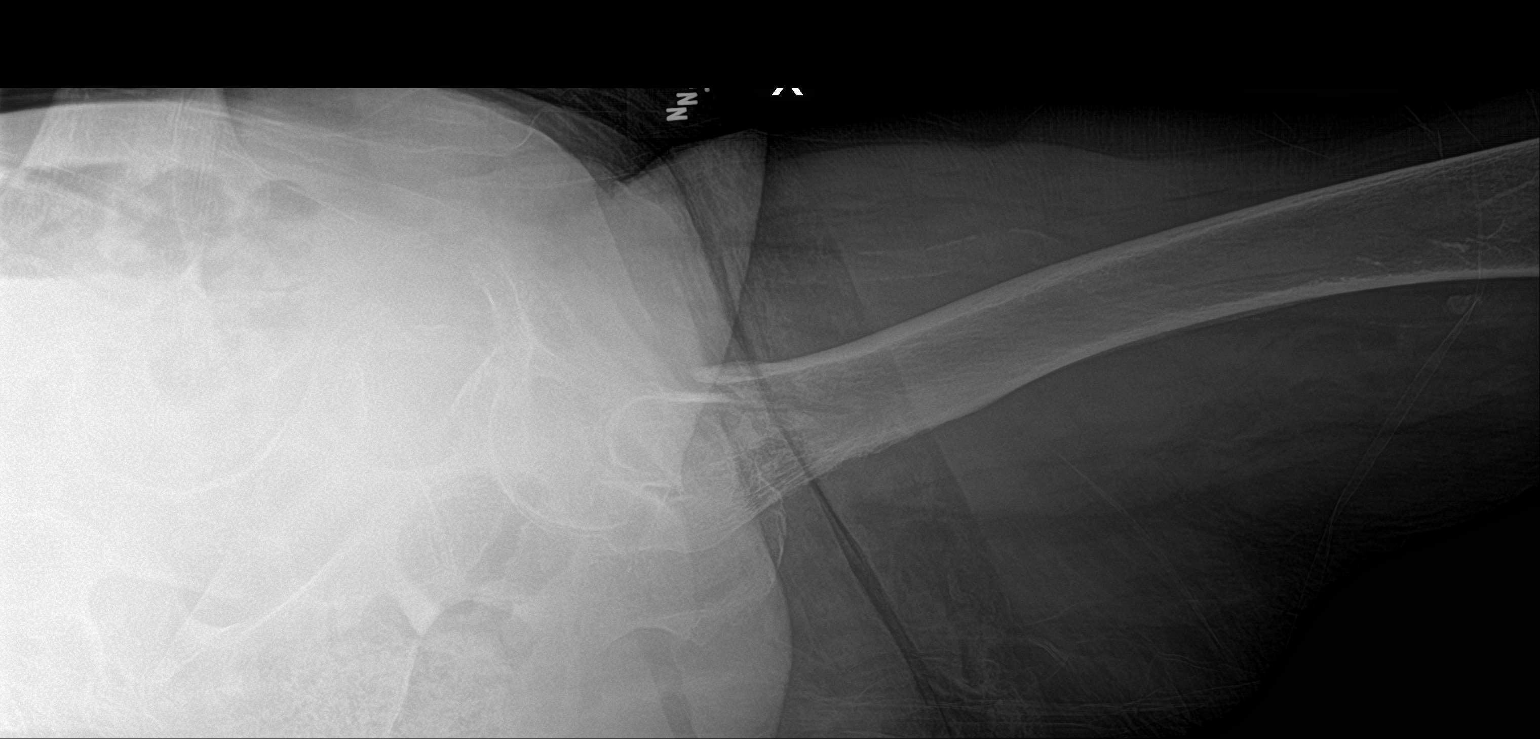

[3 of 3 positions shown; findings below may reference images not displayed]

FINDINGS: There is an acute displaced intratrochanteric fracture of the
proximal left femur. There is no dislocation. There is diffuse
osteopenia of the visualized osseous structures. There is a large
amount of stool in the colon.
IMPRESSION: Acute displaced inter trochanteric fracture of the left hip. No
dislocation. Diffuse osteopenia is noted.

## 2020-06-24 IMAGING — XA OPERATIVE LEFT HIP WITH PELVIS
6 series · 6 of 6 positions shown · non-contrast
Comparison: None.

CLINICAL DATA: LEFT hip fracture.

EXAM:
OPERATIVE LEFT HIP (WITH PELVIS IF PERFORMED) 6 VIEWS
TECHNIQUE: Fluoroscopic spot image(s) were submitted for interpretation
post-operatively.

[Series 1: cont. · 1 of 1 slices shown (1 of 6)]
[im 1/1]
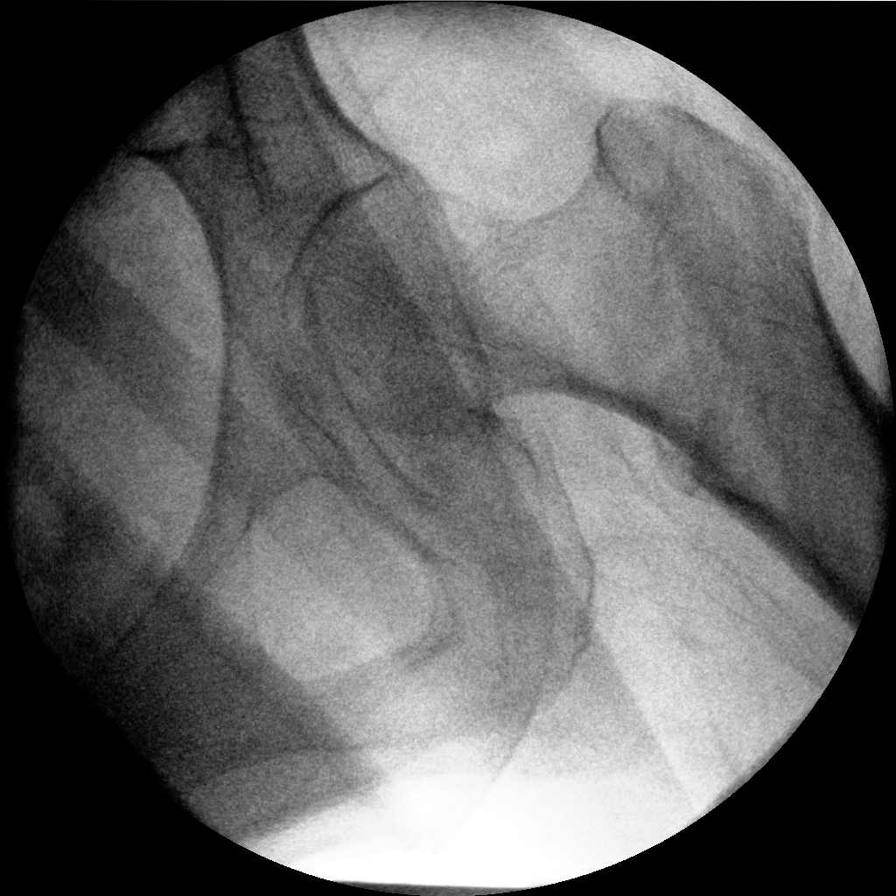

[Series 2: cont. · 1 of 1 slices shown (2 of 6)]
[im 1/1]
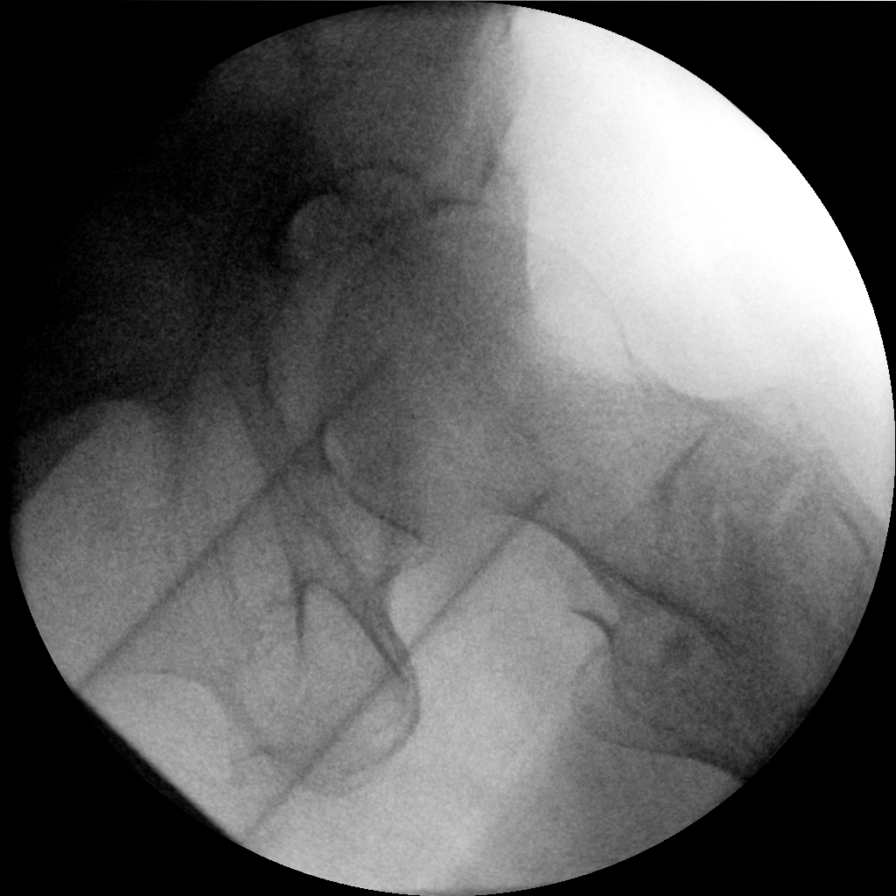

[Series 7: cont. · 1 of 1 slices shown (3 of 6)]
[im 1/1]
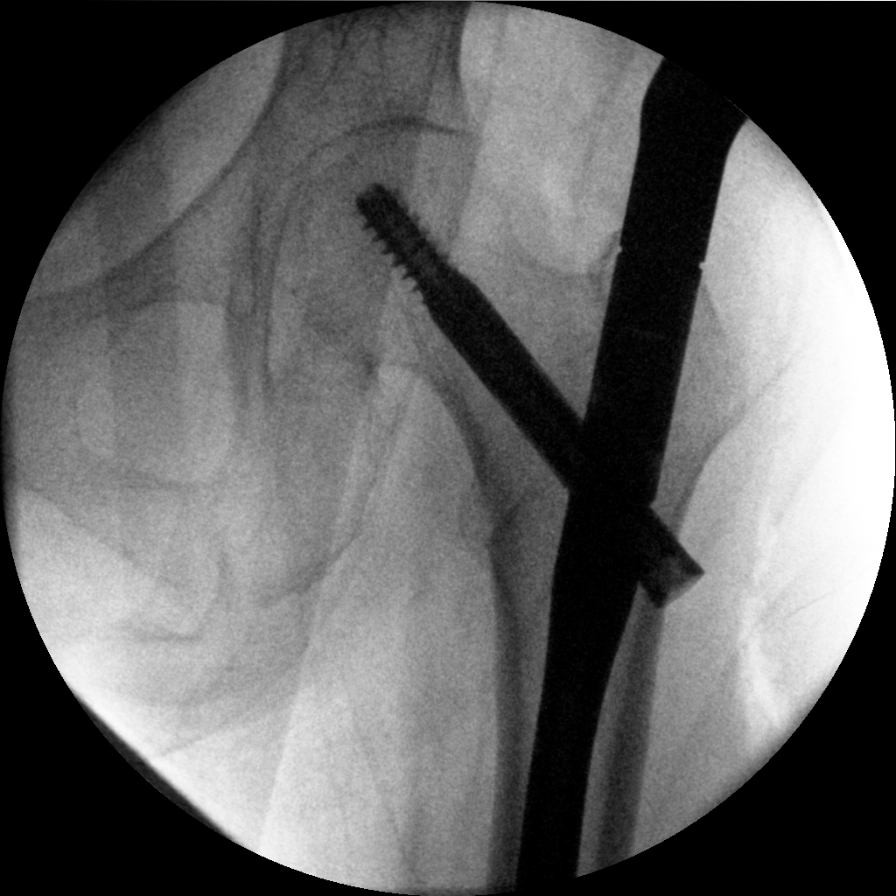

[Series 8: cont. · 1 of 1 slices shown (4 of 6)]
[im 1/1]
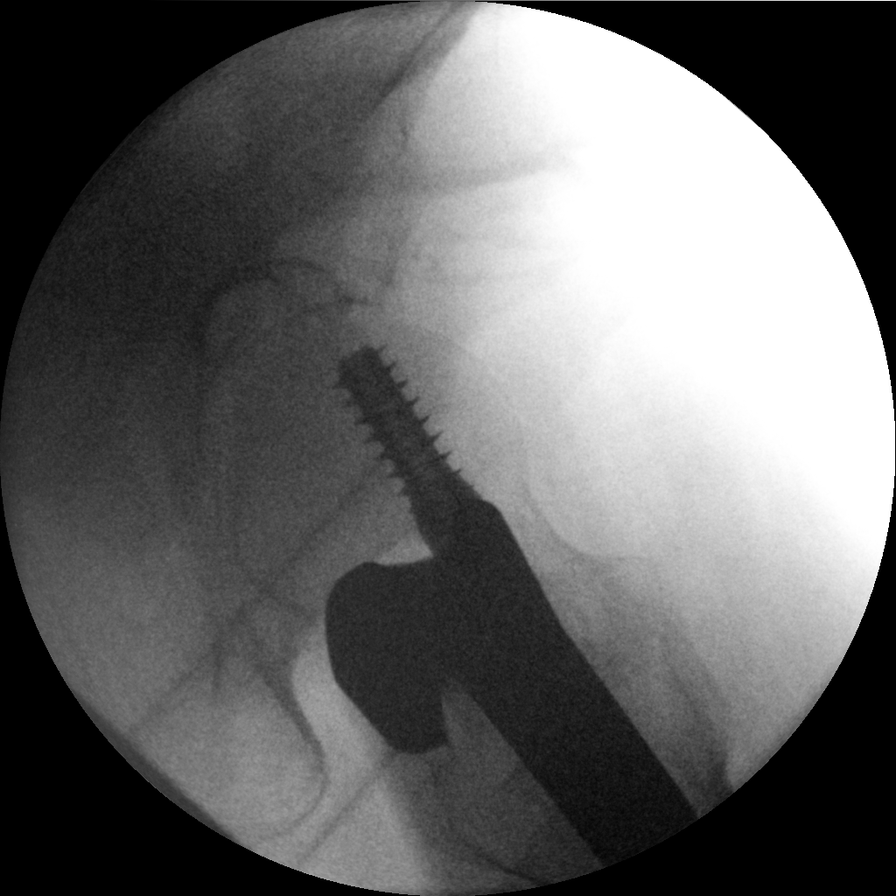

[Series 9: cont. · 1 of 1 slices shown (5 of 6)]
[im 1/1]
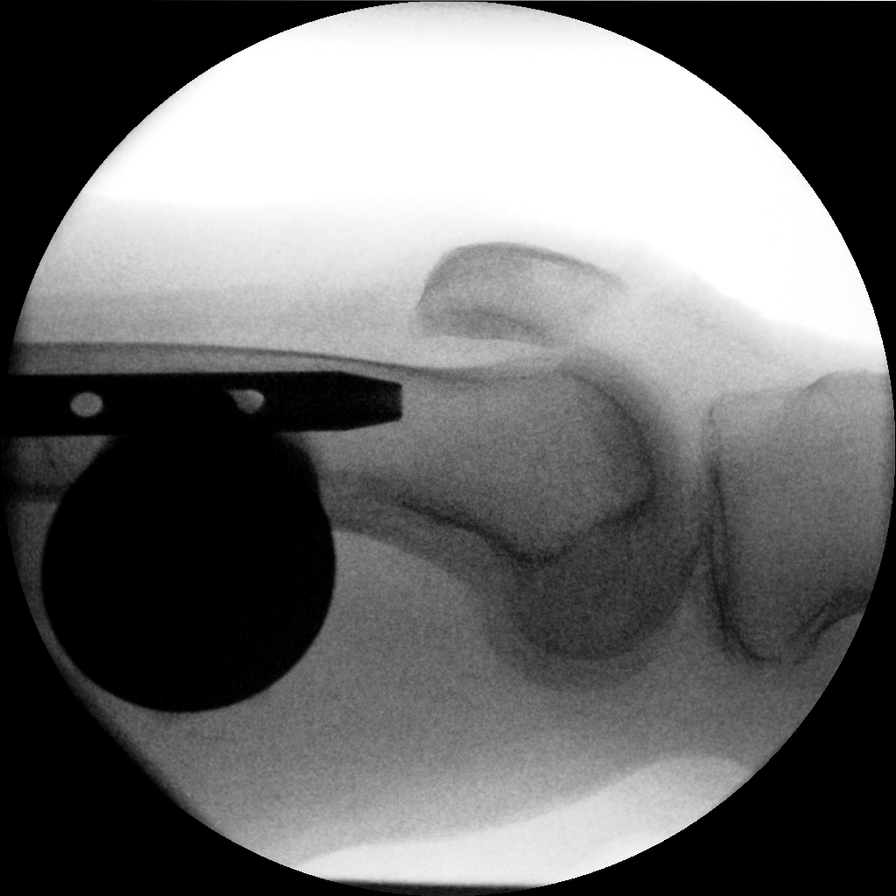

[Series 10: cont. · 1 of 1 slices shown (6 of 6)]
[im 1/1]
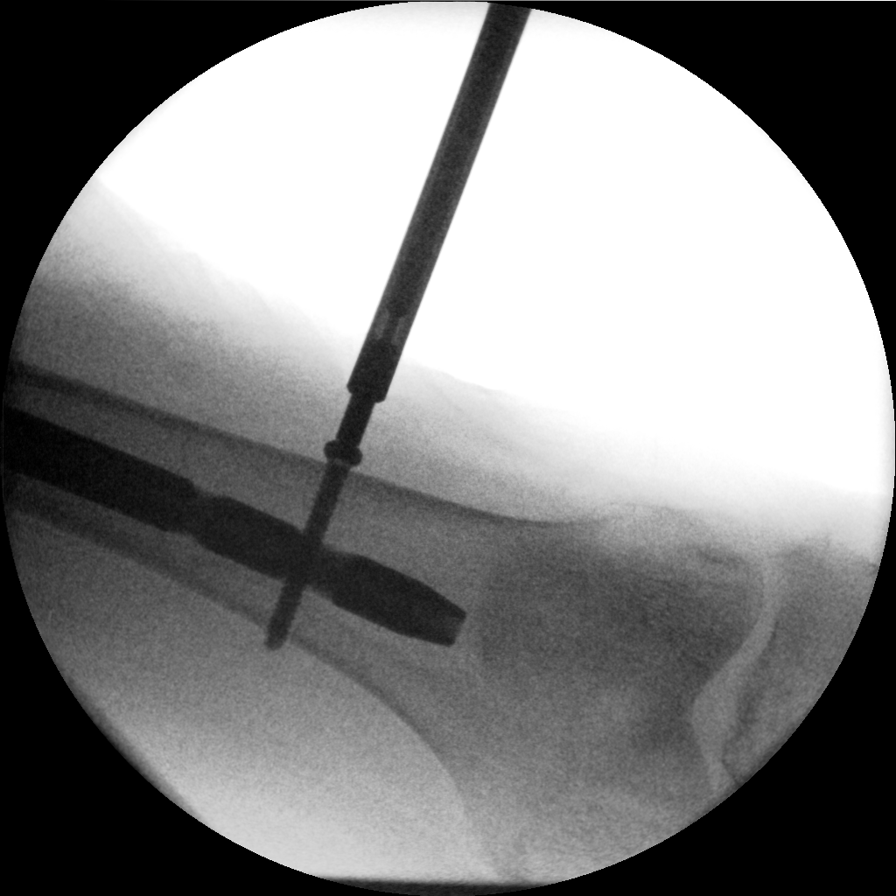

[6 of 6 positions shown; findings below may reference images not displayed]

FINDINGS: Fracture has been reduced, with satisfactory position and alignment
status post placement of compression screw with intramedullary rod.
IMPRESSION: Satisfactory postoperative appearance.

## 2020-07-08 ENCOUNTER — Other Ambulatory Visit: Payer: Medicare Other | Admitting: Adult Health Nurse Practitioner

## 2020-07-08 ENCOUNTER — Other Ambulatory Visit: Payer: Self-pay

## 2020-07-08 DIAGNOSIS — M5416 Radiculopathy, lumbar region: Secondary | ICD-10-CM

## 2020-07-08 DIAGNOSIS — Z515 Encounter for palliative care: Secondary | ICD-10-CM

## 2020-07-08 NOTE — Progress Notes (Signed)
Therapist, nutritional Palliative Care Consult Note Telephone: [phone]  Fax: [phone]  PATIENT NAME: Lauren Roman DOB: [birth year] MRN: [id number]  PRIMARY CARE PROVIDER:   Danella Penton, MD  REFERRING PROVIDER:  Danella Penton, MD [phone] [facility] [address] [facility] [place],  [place] [address]  RESPONSIBLE PARTY:   Genella Mech, daughter [phone]  Chief complaint:pain  Due to the COVID-19 crisis, this visit was done via telemedicine and it was initiated and consent by this patient and or family. Video-audio (telehealth) contact was unable to be done due to technical barriers from the patient's side.        RECOMMENDATIONS and PLAN:  1.  Advanced care planning. Patient is DNR/DNI  2. Pain.  Patient getting good relief with Xtampza 13 mg BID.  She is only needing Oxycodone 5 mg PRN maybe once or twice a day and some days not at all. Patient needing refill on oxycodone 5 mg Q 6 hrs PRN; sent to [facility]. Reviewed patient on [place] substance abuse data base  Set up appointment in one week.  Encouraged to call with any questions or concerns.  I spent 15 minutes providing this consultation, including time with patient/family, chart review, documentation. More than 50% of the time in this consultation was spent coordinating communication.   HISTORY OF PRESENT ILLNESS:  Lauren Roman is a 87 y.o. year old female with multiple medical problems including Parkinson's disease, MG, HLD, mild dementia, recurrent falls, recurrent UTIs. Palliative Care was asked to help address goals of care.   CODE STATUS: DNR  PPS: 30% HOSPICE ELIGIBILITY/DIAGNOSIS: TBD  PHYSICAL EXAM:   Deferred   PAST MEDICAL HISTORY:  Past Medical History:  Diagnosis Date  . Arthritis   . Dementia (HCC)    mild  . Headache    migraines  . Hepatitis 1952   hep b-no problems since then  . Hyperlipidemia   . Myasthenia gravis (HCC)   . Tremor     right hand    SOCIAL HX:  Social History   Tobacco Use  . Smoking status: Former Smoker    Quit date: 01/30/1963    Years since quitting: 57.4  . Smokeless tobacco: Never Used  . Tobacco comment: only social smoking and not even a full year  Substance Use Topics  . Alcohol use: Never    ALLERGIES:  Allergies  Allergen Reactions  . Exelon [Rivastigmine Tartrate]     Patient unsure of reaction  . Hydrocodone-Acetaminophen Nausea Only    Sometimes make her nausea   . Other Itching and Rash    Anything metal on skin will cause rash and itching     PERTINENT MEDICATIONS:  Outpatient Encounter Medications as of 07/08/2020  Medication Sig  . acetaminophen (TYLENOL) 650 MG CR tablet Take 650 mg by mouth every 8 (eight) hours.   Marland Kitchen alendronate (FOSAMAX) 10 MG tablet Take by mouth daily before breakfast. Yearly infusion  . apixaban (ELIQUIS) 5 MG TABS tablet Take 2 tablets (10mg ) twice daily for 7 days, then 1 tablet (5mg ) twice daily (Patient taking differently: 5 mg 2 (two) times daily. )  . aspirin EC 81 MG tablet Take 81 mg by mouth daily.  . calcium carbonate (CALCIUM 600) 600 MG TABS tablet Take 600 mg by mouth 2 (two) times daily with a meal.  . carbidopa-levodopa (SINEMET IR) 25-100 MG tablet Take by mouth.  . Cholecalciferol (VITAMIN D-1000 MAX ST) 1000 units tablet Take  1,000 Units by mouth daily.  . fluticasone (FLONASE) 50 MCG/ACT nasal spray Place 1 spray into both nostrils daily.  Marland Kitchen gabapentin (NEURONTIN) 100 MG capsule Take 100 mg by mouth at bedtime. Take 1 capsule (100mg ) by mouth every morning and take 2 capsules (200mg ) by mouth every evening  . magnesium oxide (MAG-OX) 400 MG tablet Take 400 mg by mouth daily.  . megestrol (MEGACE) 40 MG tablet Take 40 mg by mouth daily.  . metroNIDAZOLE (METROGEL) 1 % gel Apply 1 application topically as directed.  . mirtazapine (REMERON) 7.5 MG tablet Take 7.5 mg by mouth at bedtime.  omeprazole (PRILOSEC) 40 MG capsule Take 40 mg  by mouth daily.  . polyethylene glycol (MIRALAX / GLYCOLAX) 17 g packet Take 17 g by mouth daily.  . Probiotic Product (ALIGN) 4 MG CAPS Take 4 mg by mouth daily.  Propylene Glycol (SYSTANE COMPLETE OP) Place 1 drop into both eyes daily.  Marland Kitchen senna-docusate (SENOKOT-S) 8.6-50 MG tablet Take 1 tablet by mouth 2 (two) times daily.  . vitamin B-12 (CYANOCOBALAMIN) 1000 MCG tablet Take 1,000 mcg by mouth daily.  . vitamin k 100 MCG tablet Take 100 mcg by mouth daily.   No facility-administered encounter medications on file as of 07/08/2020.      Lauren Roman 08-21-1976, NP

## 2020-07-13 ENCOUNTER — Other Ambulatory Visit: Payer: Self-pay

## 2020-07-13 ENCOUNTER — Other Ambulatory Visit: Payer: Medicare Other | Admitting: Adult Health Nurse Practitioner

## 2020-07-13 DIAGNOSIS — Z515 Encounter for palliative care: Secondary | ICD-10-CM

## 2020-07-13 DIAGNOSIS — G20 Parkinson's disease: Secondary | ICD-10-CM

## 2020-07-13 DIAGNOSIS — F03A0 Unspecified dementia, mild, without behavioral disturbance, psychotic disturbance, mood disturbance, and anxiety: Secondary | ICD-10-CM

## 2020-07-13 DIAGNOSIS — F0390 Unspecified dementia without behavioral disturbance: Secondary | ICD-10-CM

## 2020-07-13 DIAGNOSIS — M5416 Radiculopathy, lumbar region: Secondary | ICD-10-CM

## 2020-07-13 NOTE — Progress Notes (Signed)
Therapist, nutritional Palliative Care Consult Note Telephone: [phone]  Fax: [phone]  PATIENT NAME: Lauren Roman DOB: September 12, 1928 MRN: [id number]  PRIMARY CARE PROVIDER:   Danella Penton, MD  REFERRING PROVIDER:  Danella Penton, MD [phone] [facility] [address] [facility] [place],  [place] [address]  RESPONSIBLE PARTY:   Genella Mech, daughter [phone]  Chief complaint:pain/debility  Patient called yesterday to confirm appointment.  COVID screening done.  RECOMMENDATIONS and PLAN: 1.Advanced care planning. Patient is DNR/DNI  2.  Pain.  Continue with Xtampza 13.5 mg BID and oxycodone 5 mg Q 6 hrs PRN.  Does not need a refill today. Advised to call when needing a refill.   3.  Parkinson's disease/ Dementia.  Patient's functional status unchanged.  She is wheelchair bound.  Incontinent of B&B.  Able to feed herself.  Continue follow up and recommendations by neurology  Palliative will continue to monitor for symptom management/decline and make recommendations as needed.  Follow up in 6 weeks.  Encouraged to call with any questions or concerns  I spent 60 minutes providing this consultation, including time spent with patient/family, provider coordination, chart review, documentation. More than 50% of the time in this consultation was spent coordinating communication.   HISTORY OF PRESENT ILLNESS:  Lauren Roman is a 87 y.o. year old female with multiple medical problems including Parkinson's disease, MG, HLD, mild dementia, recurrent falls, recurrent UTIs. Palliative Care was asked to help address goals of care. Daughter states that her mother is having hallucinations of a little girl the past few nights.  Her hallucinations are worse in the evenings.  Patient states that the hallucinations do not frighten her.  Patient currently being treated with cipro for UTI.  Daughter states that other than increased odor of urine that she knows  when her mother has a UTI by the increased hallucinations.  Patient having regular BMs.  Patient states that she gets headaches sometimes that start with her right eye hurting.  Daughter states that she usually does not complain of headaches but will complain of back pain.  She is getting good relief with Xtampza 13.5 mg BID and oxycodone 5mg  PRN.  She is needing the PRN oxycodone 1-2 times a day and some days does not need it.  Patient is being tapered off of her sinemet due to excessive drowsiness.  Daughter states that she still sleeps a lot but she doesn't sleep as deep and is easier to arouse from sleep.  Daughter states that she is eating only about 25% of meals but will eat sweets.  She will drink Boosts and really likes ice cream.  Unsure of weight loss.  Rest of 10 point ROS asked and negative  CODE STATUS: DNR/DNI  PPS: 30% HOSPICE ELIGIBILITY/DIAGNOSIS: TBD  PHYSICAL EXAM:  BP 132/68  HR 88 O2 98% on RA   General: NAD, frail appearing, thin Eyes: sclera anicteric and noninjected with no discharge noted ENMT: moist mucous membranes Cardiovascular: regular rate and rhythm Pulmonary: lung sounds clear; normal respiratory effort Abdomen: soft, nontender, + bowel sounds Extremities: no edema, no joint deformities Skin: no rashes on exposed skin Neurological: Weakness but otherwise nonfocal; has forgetfulness  PAST MEDICAL HISTORY:  Past Medical History:  Diagnosis Date  . Arthritis   . Dementia (HCC)    mild  . Headache    migraines  . Hepatitis 1952   hep b-no problems since then  . Hyperlipidemia   . Myasthenia gravis (HCC)   .  Tremor    right hand    SOCIAL HX:  Social History   Tobacco Use  . Smoking status: Former Smoker    Quit date: 01/30/1963    Years since quitting: 57.4  . Smokeless tobacco: Never Used  . Tobacco comment: only social smoking and not even a full year  Substance Use Topics  . Alcohol use: Never    ALLERGIES:  Allergies  Allergen Reactions   . Exelon [Rivastigmine Tartrate]     Patient unsure of reaction  . Hydrocodone-Acetaminophen Nausea Only    Sometimes make her nausea   . Other Itching and Rash    Anything metal on skin will cause rash and itching     PERTINENT MEDICATIONS:  Outpatient Encounter Medications as of 07/13/2020  Medication Sig  . acetaminophen (TYLENOL) 650 MG CR tablet Take 650 mg by mouth every 8 (eight) hours.   Marland Kitchen alendronate (FOSAMAX) 10 MG tablet Take by mouth daily before breakfast. Yearly infusion  . apixaban (ELIQUIS) 5 MG TABS tablet Take 2 tablets (10mg ) twice daily for 7 days, then 1 tablet (5mg ) twice daily (Patient taking differently: 5 mg 2 (two) times daily. )  . aspirin EC 81 MG tablet Take 81 mg by mouth daily.  . calcium carbonate (CALCIUM 600) 600 MG TABS tablet Take 600 mg by mouth 2 (two) times daily with a meal.  . carbidopa-levodopa (SINEMET IR) 25-100 MG tablet Take by mouth.  . Cholecalciferol (VITAMIN D-1000 MAX ST) 1000 units tablet Take 1,000 Units by mouth daily.  . fluticasone (FLONASE) 50 MCG/ACT nasal spray Place 1 spray into both nostrils daily.  gabapentin (NEURONTIN) 100 MG capsule Take 100 mg by mouth at bedtime. Take 1 capsule (100mg ) by mouth every morning and take 2 capsules (200mg ) by mouth every evening  . magnesium oxide (MAG-OX) 400 MG tablet Take 400 mg by mouth daily.  . megestrol (MEGACE) 40 MG tablet Take 40 mg by mouth daily.  . metroNIDAZOLE (METROGEL) 1 % gel Apply 1 application topically as directed.  . mirtazapine (REMERON) 7.5 MG tablet Take 7.5 mg by mouth at bedtime.  omeprazole (PRILOSEC) 40 MG capsule Take 40 mg by mouth daily.  . polyethylene glycol (MIRALAX / GLYCOLAX) 17 g packet Take 17 g by mouth daily.  . Probiotic Product (ALIGN) 4 MG CAPS Take 4 mg by mouth daily.  Marland Kitchen Propylene Glycol (SYSTANE COMPLETE OP) Place 1 drop into both eyes daily.  senna-docusate (SENOKOT-S) 8.6-50 MG tablet Take 1 tablet by mouth 2 (two) times daily.  . vitamin  B-12 (CYANOCOBALAMIN) 1000 MCG tablet Take 1,000 mcg by mouth daily.  . vitamin k 100 MCG tablet Take 100 mcg by mouth daily.   No facility-administered encounter medications on file as of 07/13/2020.     Amy Marland Kitchen, NP

## 2020-08-06 ENCOUNTER — Other Ambulatory Visit: Payer: Self-pay

## 2020-08-06 ENCOUNTER — Other Ambulatory Visit: Payer: Medicare Other | Admitting: Adult Health Nurse Practitioner

## 2020-08-06 DIAGNOSIS — M1990 Unspecified osteoarthritis, unspecified site: Secondary | ICD-10-CM

## 2020-08-06 DIAGNOSIS — M5416 Radiculopathy, lumbar region: Secondary | ICD-10-CM

## 2020-08-06 DIAGNOSIS — M5136 Other intervertebral disc degeneration, lumbar region: Secondary | ICD-10-CM

## 2020-08-06 DIAGNOSIS — Z515 Encounter for palliative care: Secondary | ICD-10-CM

## 2020-08-06 NOTE — Progress Notes (Signed)
Therapist, nutritional Palliative Care Consult Note Telephone: [phone]  Fax: [phone]  PATIENT NAME: Lauren Roman DOB: 1929/02/25 MRN: [id number]  PRIMARY CARE PROVIDER:   Danella Penton, MD  REFERRING PROVIDER:  Danella Penton, MD [phone] [facility] [address] [facility] [place],  [place] [address]  RESPONSIBLE PARTY:   Genella Mech, daughter [phone]  Chief complaint:pain   This was phone evaluation initiated by patient's daughter due to her having increased pain  RECOMMENDATIONS and PLAN:  1.  Advanced care planning.  Patient is DNR/DNI  2.  Pain.  Patient has pain from multiple causes including DDD, osteoarthritis, lumbar radiculopathy.  Daughter does state that patient is having increased pain and is taking her breakthrough oxycodone 5 mg more often.  She is currently on Xtampza ER 13.5 mg twice daily with oxycodone 5 mg every 6 hours as needed.  Daughter states that she has not had any falls or trauma but is complaining of increased pain in her back and hips.  Today patient is complaining of left hip pain while on the phone.  Discussed increasing Xtampza ER to 18 mg every 12 hours and keeping the oxycodone 5 mg every 6 hours as needed.  Daughter is in agreement with this.  Checked [place] substance abuse database with no concerns.  Sent in prescription for [facility] ER ER 18 mg every [facility] [place].  Set up appointment for 08/10/2020 at 1:30 PM for follow-up.  I spent 20 minutes providing this consultation, including time spent with patient/family, chart review, documentation. More than 50% of the time in this consultation was spent coordinating communication.   HISTORY OF PRESENT ILLNESS:  Lauren Roman is a 87 y.o. year old female with multiple medical problems including Parkinson's disease, MG, HLD, mild dementia, recurrent falls, recurrent UTIs. Palliative Care was asked to help address goals of care.    CODE STATUS: DNR/DNI  PPS: 30% HOSPICE ELIGIBILITY/DIAGNOSIS: TBD  PAST MEDICAL HISTORY:  Past Medical History:  Diagnosis Date  . Arthritis   . Dementia (HCC)    mild  . Headache    migraines  . Hepatitis 1952   hep b-no problems since then  . Hyperlipidemia   . Myasthenia gravis (HCC)   . Tremor    right hand    SOCIAL HX:  Social History   Tobacco Use  . Smoking status: Former Smoker    Quit date: 01/30/1963    Years since quitting: 57.5  . Smokeless tobacco: Never Used  . Tobacco comment: only social smoking and not even a full year  Substance Use Topics  . Alcohol use: Never    ALLERGIES:  Allergies  Allergen Reactions  . Exelon [Rivastigmine Tartrate]     Patient unsure of reaction  . Hydrocodone-Acetaminophen Nausea Only    Sometimes make her nausea   . Other Itching and Rash    Anything metal on skin will cause rash and itching     PERTINENT MEDICATIONS:  Outpatient Encounter Medications as of 08/06/2020  Medication Sig  . acetaminophen (TYLENOL) 650 MG CR tablet Take 650 mg by mouth every 8 (eight) hours.   Marland Kitchen alendronate (FOSAMAX) 10 MG tablet Take by mouth daily before breakfast. Yearly infusion  . apixaban (ELIQUIS) 5 MG TABS tablet Take 2 tablets (10mg ) twice daily for 7 days, then 1 tablet (5mg ) twice daily (Patient taking differently: 5 mg 2 (two) times daily. )  . aspirin EC 81 MG  tablet Take 81 mg by mouth daily.  . calcium carbonate (CALCIUM 600) 600 MG TABS tablet Take 600 mg by mouth 2 (two) times daily with a meal.  . carbidopa-levodopa (SINEMET IR) 25-100 MG tablet Take by mouth.  . Cholecalciferol (VITAMIN D-1000 MAX ST) 1000 units tablet Take 1,000 Units by mouth daily.  . fluticasone (FLONASE) 50 MCG/ACT nasal spray Place 1 spray into both nostrils daily.  Marland Kitchen gabapentin (NEURONTIN) 100 MG capsule Take 100 mg by mouth at bedtime. Take 1 capsule (100mg ) by mouth every morning and take 2 capsules (200mg ) by mouth every evening  . magnesium  oxide (MAG-OX) 400 MG tablet Take 400 mg by mouth daily.  . megestrol (MEGACE) 40 MG tablet Take 40 mg by mouth daily.  . metroNIDAZOLE (METROGEL) 1 % gel Apply 1 application topically as directed.  . mirtazapine (REMERON) 7.5 MG tablet Take 7.5 mg by mouth at bedtime.  omeprazole (PRILOSEC) 40 MG capsule Take 40 mg by mouth daily.  . polyethylene glycol (MIRALAX / GLYCOLAX) 17 g packet Take 17 g by mouth daily.  . Probiotic Product (ALIGN) 4 MG CAPS Take 4 mg by mouth daily.  Propylene Glycol (SYSTANE COMPLETE OP) Place 1 drop into both eyes daily.  Marland Kitchen senna-docusate (SENOKOT-S) 8.6-50 MG tablet Take 1 tablet by mouth 2 (two) times daily.  . vitamin B-12 (CYANOCOBALAMIN) 1000 MCG tablet Take 1,000 mcg by mouth daily.  . vitamin k 100 MCG tablet Take 100 mcg by mouth daily.   No facility-administered encounter medications on file as of 08/06/2020.     Kaysia Willard 08-21-1978, NP

## 2020-08-10 ENCOUNTER — Other Ambulatory Visit: Payer: Self-pay

## 2020-08-10 ENCOUNTER — Other Ambulatory Visit: Payer: Medicare Other | Admitting: Adult Health Nurse Practitioner

## 2020-08-10 DIAGNOSIS — F03A0 Unspecified dementia, mild, without behavioral disturbance, psychotic disturbance, mood disturbance, and anxiety: Secondary | ICD-10-CM

## 2020-08-10 DIAGNOSIS — M5416 Radiculopathy, lumbar region: Secondary | ICD-10-CM

## 2020-08-10 DIAGNOSIS — F0390 Unspecified dementia without behavioral disturbance: Secondary | ICD-10-CM

## 2020-08-10 DIAGNOSIS — Z515 Encounter for palliative care: Secondary | ICD-10-CM

## 2020-08-10 DIAGNOSIS — G20 Parkinson's disease: Secondary | ICD-10-CM

## 2020-08-10 DIAGNOSIS — M1990 Unspecified osteoarthritis, unspecified site: Secondary | ICD-10-CM

## 2020-08-10 NOTE — Progress Notes (Signed)
Therapist, nutritional Palliative Care Consult Note Telephone: [phone]  Fax: [phone]  PATIENT NAME: Lauren Roman DOB: [birth year] MRN: [id number]  PRIMARY CARE PROVIDER:   Danella Penton, MD  REFERRING PROVIDER:  Danella Penton, MD [phone] [facility] [address] [facility] [place],  [place] [address]  RESPONSIBLE PARTY:  Genella Mech, daughter [phone]  Chief complaint: Follow-up palliative visit/pain    RECOMMENDATIONS and PLAN:  1.  Advanced care planning.  Patient DNR/DNI  2.  Chronic pain related to multiple causes.  Have encouraged daughter to pick up new Xtampza ER 18 mg twice daily prescription to see if this will be effective with her mother's pain.  Will call next week to see if this has been effective.  3.  Parkinson's/dementia.  Patient is having increased hallucinations especially in the evenings.  Patient is having slow functional decline where she is offering less and less assistance with transfers is pretty much bedbound requiring total care.  Patient is able to feed herself. Daughter has concerns that with the increased left mouth droop and offering less assistance with care that her mother may have had a small stroke.  We did discuss this is a possibility.  The patient states pain all over she is particularly complaining of pain in her left leg and left elbow.  It is possible that she may have had a CVA or TIA.  Patient does have history of clots and DVT.  Did discuss with daughter today that if her mother continues to have any events like this what she would like to do in terms of taking her back and forth to the hospital or just keeping her comfortable at home.  She states that she has not thought of this before and would like to discuss that with her mother and her husband more.  This will be an ongoing discussion.  Spent more than 20 minutes discussing complex medical decision making.  Patient is having worsening of her  dementia with possibility of having TIA or CVA.  She is at risk of having more events such as this.  We will continue to follow her closely.  Palliative will continue to monitor for symptom management/decline and make recommendations as needed.  Follow-up visit in 4 weeks.  Encouraged to call with any questions or concerns.  HISTORY OF PRESENT ILLNESS:  Lauren Roman is a 87 y.o. year old female with multiple medical problems including Parkinson's disease, MG, HLD, mild dementia, recurrent falls, recurrent UTIs. Palliative Care was asked to help address goals of care.  4 days ago had sent in prescription for increased Xtampza ER 18 mg twice daily.  Daughter has not had a chance to pick this up yet to try it.  Daughter does state that she is complaining more frequently with pain especially on her left side such as her left elbow and her left leg have been hurting more.  States that this has been going on for about 2 weeks now.  Patient states that she hurts all over.  Denies falls or trauma.  The patient is requiring more assistance with transfers and is pretty much bed and chair bound.  Daughter states that she was having increased hallucinations and foul odor to her urine and was started on antibiotic for possible UTI.  Patient does get frequent UTIs.  Daughter does state that her appetite seems to have picked up a little bit.  She does not think her mother has lost any weight.  Daughter does state that on her right heel she has noticed a pressure injury about 2 weeks ago.  States that it started out as a dark purple and is now more of a dark red.  She does state that she feels like it has improved as she has been floating her mother's heels more in keeping her feet moisturized.  Daughter does state that her mother has been complaining that her left ear has been hurting.  She thought she meant the pain was on the inside of his ear but has noticed a sore on the outside pinna of her ear.  She does state that her  mother has not been complaining as much about pain with her ear the past couple of days.  Daughter does state that she notices that the left side of her mother's mouth droops more over the past 2 weeks.  Patient is having regular bowel movements.  Denies fever, cough, increased shortness of breath, N/V/D.  CODE STATUS: DNR/DNI  PPS: 30% HOSPICE ELIGIBILITY/DIAGNOSIS: TBD  PHYSICAL EXAM: BP 132/68 HR 73 O2 98% on RA  General: NAD, frail appearing, thin Eyes: sclera anicteric and noninjected with no discharge noted ENMT: moist mucous membranes; bilateral TMs are pearly gray with no bulging or redness noted, slight cerumen noticed bilaterally.  Left ear pinna does have scab noted that looks like it has been healing. Cardiovascular: regular rate and rhythm Pulmonary: lung sounds clear; normal respiratory effort Abdomen: soft, nontender, + bowel sounds Extremities: no edema, no joint deformities Skin: no rashes on exposed skin; does have quarter size dark red SDTI to right heel Neurological: Weakness but otherwise nonfocal; alert and oriented to person and place    PAST MEDICAL HISTORY:  Past Medical History:  Diagnosis Date  . Arthritis   . Dementia (HCC)    mild  . Headache    migraines  . Hepatitis 1952   hep b-no problems since then  . Hyperlipidemia   . Myasthenia gravis (HCC)   . Tremor    right hand    SOCIAL HX:  Social History   Tobacco Use  . Smoking status: Former Smoker    Quit date: 01/30/1963    Years since quitting: 57.5  . Smokeless tobacco: Never Used  . Tobacco comment: only social smoking and not even a full year  Substance Use Topics  . Alcohol use: Never    ALLERGIES:  Allergies  Allergen Reactions  . Exelon [Rivastigmine Tartrate]     Patient unsure of reaction  . Hydrocodone-Acetaminophen Nausea Only    Sometimes make her nausea   . Other Itching and Rash    Anything metal on skin will cause rash and itching     PERTINENT MEDICATIONS:   Outpatient Encounter Medications as of 08/10/2020  Medication Sig  . acetaminophen (TYLENOL) 650 MG CR tablet Take 650 mg by mouth every 8 (eight) hours.   Marland Kitchen alendronate (FOSAMAX) 10 MG tablet Take by mouth daily before breakfast. Yearly infusion  . apixaban (ELIQUIS) 5 MG TABS tablet Take 2 tablets (10mg ) twice daily for 7 days, then 1 tablet (5mg ) twice daily (Patient taking differently: 5 mg 2 (two) times daily. )  . aspirin EC 81 MG tablet Take 81 mg by mouth daily.  . calcium carbonate (CALCIUM 600) 600 MG TABS tablet Take 600 mg by mouth 2 (two) times daily with a meal.  . carbidopa-levodopa (SINEMET IR) 25-100 MG tablet Take by mouth.  . Cholecalciferol (VITAMIN D-1000 MAX ST) 1000 units tablet  Take 1,000 Units by mouth daily.  . fluticasone (FLONASE) 50 MCG/ACT nasal spray Place 1 spray into both nostrils daily.  Marland Kitchen gabapentin (NEURONTIN) 100 MG capsule Take 100 mg by mouth at bedtime. Take 1 capsule (100mg ) by mouth every morning and take 2 capsules (200mg ) by mouth every evening  . magnesium oxide (MAG-OX) 400 MG tablet Take 400 mg by mouth daily.  . megestrol (MEGACE) 40 MG tablet Take 40 mg by mouth daily.  . metroNIDAZOLE (METROGEL) 1 % gel Apply 1 application topically as directed.  . mirtazapine (REMERON) 7.5 MG tablet Take 7.5 mg by mouth at bedtime.  omeprazole (PRILOSEC) 40 MG capsule Take 40 mg by mouth daily.  . polyethylene glycol (MIRALAX / GLYCOLAX) 17 g packet Take 17 g by mouth daily.  . Probiotic Product (ALIGN) 4 MG CAPS Take 4 mg by mouth daily.  Propylene Glycol (SYSTANE COMPLETE OP) Place 1 drop into both eyes daily.  Marland Kitchen senna-docusate (SENOKOT-S) 8.6-50 MG tablet Take 1 tablet by mouth 2 (two) times daily.  . vitamin B-12 (CYANOCOBALAMIN) 1000 MCG tablet Take 1,000 mcg by mouth daily.  . vitamin k 100 MCG tablet Take 100 mcg by mouth daily.   No facility-administered encounter medications on file as of 08/10/2020.      Shernell Saldierna 08-21-1976, NP

## 2020-08-17 ENCOUNTER — Telehealth: Payer: Self-pay | Admitting: Adult Health Nurse Practitioner

## 2020-08-17 NOTE — Telephone Encounter (Signed)
This is late entry.  Spoke with daughter yesterday, 08/16/20.  Her mother is having constipation x7days.  She has tried giving senna and baking soda suggestion she found online.  Suggested Mirlax, MOM, mag citrate, or mineral oil enema.  Discussed with daughter that if none of these give her relief she may need to go to hospital to rule out SBO.  Encouraged to call with any concerns or questions.  Will call in 2 days for follow up. Eryn Marandola K. Garner Nash NP

## 2020-08-19 ENCOUNTER — Telehealth: Payer: Self-pay | Admitting: Adult Health Nurse Practitioner

## 2020-08-19 NOTE — Telephone Encounter (Signed)
Spoke with daughter to follow up if her mother had a BM.  She did have one after 7 days.  Daughter had some other concerns and agreed to talk further about them at our next visit in a week.  Encouraged to call with any questions or concerns. Chaneka Trefz K. Garner Nash NP

## 2020-08-20 ENCOUNTER — Telehealth: Payer: Self-pay | Admitting: Adult Health Nurse Practitioner

## 2020-08-20 NOTE — Telephone Encounter (Signed)
Called and left message with patient's daughter, Genella Mech, asking her if we could reschedule the 08/26/20 Palliative f/u visit to 09/02/20, left my name and call back number requesting a return call.

## 2020-08-26 ENCOUNTER — Other Ambulatory Visit: Payer: Medicare Other | Admitting: Adult Health Nurse Practitioner

## 2020-08-31 ENCOUNTER — Other Ambulatory Visit: Payer: Medicare Other | Admitting: Adult Health Nurse Practitioner

## 2020-08-31 ENCOUNTER — Other Ambulatory Visit: Payer: Self-pay

## 2020-08-31 ENCOUNTER — Encounter: Payer: Self-pay | Admitting: Adult Health Nurse Practitioner

## 2020-08-31 VITALS — BP 118/62 | HR 93

## 2020-08-31 DIAGNOSIS — M1990 Unspecified osteoarthritis, unspecified site: Secondary | ICD-10-CM

## 2020-08-31 DIAGNOSIS — Z515 Encounter for palliative care: Secondary | ICD-10-CM

## 2020-08-31 DIAGNOSIS — M5416 Radiculopathy, lumbar region: Secondary | ICD-10-CM

## 2020-08-31 DIAGNOSIS — G20 Parkinson's disease: Secondary | ICD-10-CM

## 2020-08-31 DIAGNOSIS — F0390 Unspecified dementia without behavioral disturbance: Secondary | ICD-10-CM

## 2020-08-31 DIAGNOSIS — K5900 Constipation, unspecified: Secondary | ICD-10-CM

## 2020-08-31 DIAGNOSIS — F03A0 Unspecified dementia, mild, without behavioral disturbance, psychotic disturbance, mood disturbance, and anxiety: Secondary | ICD-10-CM

## 2020-08-31 NOTE — Progress Notes (Signed)
Designer, jewellery Palliative Care Consult Note Telephone: [phone]  Fax: [phone]    Date of encounter: 08/31/20 PATIENT NAME: Lauren Roman [address] [place] [address]   [phone] (home)  DOB: 25-Nov-1928 MRN: [id number] PRIMARY CARE PROVIDER:    Rusty Aus, MD,  Lauren Roman [address] [phone]  REFERRING PROVIDER:   Rusty Aus, MD [facility] [place],  [place] [address] [phone]  RESPONSIBLE PARTY:    Contact Information    Name Relation Home Work [place] Daughter [phone]  [phone]   Lauren Roman [phone]  [phone]   Lauren Roman  [phone]         I met face to face with patient and family in home. Palliative Care was asked to follow this patient by consultation request of  Lauren Aus, MD to address advance care planning and complex medical decision making. This is a follow up visit.  Daughter present during visit today                                   ASSESSMENT AND PLAN / RECOMMENDATIONS:   Advance Care Planning/Goals of Care: Goals include to maximize quality of life and symptom management.   CODE STATUS: DNR/DNI  Symptom Management/Plan:  Chronic pain:  Patient having drowsiness with increased dose of Xtampza.  Discussed with daughter keeping the Xtampza at 43m BID and supplementing with the oxycodone 574mQ 6hrs PRN. Did discuss that the dip in the mattress could be exacerbating the back pain and that they could use egg crate or gel overlay on the mattress to help with this. Daughter in agreement with this.    Parkinson's/Dementia:  Patient is offering less and less help with transfers.  She is   Constipation:  Discussed continuing with the senna and miralax as she has been taking it.  Continuing adding the metamucil and extra fruit to her shakes for extra fiber.   Will continue to monitor for effectiveness.  Follow up Palliative Care Visit: Palliative care will continue to follow for complex medical decision making, advance care planning, and clarification of goals. Return 5 weeks or prn.  Daughter encouraged to call with any questions or concerns.  I spent 60 minutes providing this consultation. More than 50% of the time in this consultation was spent in counseling and care coordination.   PPS: 30%  HOSPICE ELIGIBILITY/DIAGNOSIS: TBD  Chief Complaint: follow up palliative visit  HISTORY OF PRESENT ILLNESS:  Lauren BELMONTEs a [age].o. year old female  with Parkinson's disease, MG, HLD, mild dementia, recurrent falls, recurrent UTIs. Daughter states that her mother is offering less help with transfers and she is becoming more stiff.  Patient has some days in which her pain is worse than other days.  Daughter gives her the oxycodone 5 mg PRN up to 3 times per day.  Patient asking if the dip in her mattress could be causing the back pain.  She has chronic back pain that could be exacerbated by the dip in the mattress.  Daughter states that her mother has been having constipation.  She gets senna BID and half a dose of miralax daily.  Gets relief with MOM when has not had a BM in 3 days.  She has been adding metamucil and extra fruit to her shakes  for extra fiber over the past few days.  Daughter states that she has been making her mother shakes with Boost, fruit, and protein powder to get her extra calories as she is not eating much.  Daughter states that she enjoys the shakes and will eat the whole thing.  Patient had headaches and discharge of right eye that caused matting in the mornings.  She was given Keflex by her PCP for this. Believe she may have had a sinus infection due to seasonal allergies.  Rest of 10 point ROS asked and negative, except what is stated in HPI.  History obtained from review of EMR and interview with family and Lauren Roman.   PHYSICAL  EXAM: MAC: 26.5 cm General: NAD, frail appearing, thin Eyes: sclera anicteric and noninjected with no discharge noted ENMT: moist mucous membranes Cardiovascular: regular rate and rhythm Pulmonary:lung sounds clear; normal respiratory effort Abdomen: soft, nontender, + bowel sounds Extremities: no edema, no joint deformities Skin: no rasheson exposed skin Neurological: Weakness; alert and oriented to person and place  Thank you for the opportunity to participate in the care of Lauren Roman.  The palliative care team will continue to follow. Please call our office at [phone] if we can be of additional assistance.   Laiya Wisby Jenetta Downer, NP , DNP  This chart was dictated using voice recognition software. Despite best efforts to proofread, errors can occur which can change the documentation meaning.   COVID-19 PATIENT SCREENING TOOL Asked and negative response unless otherwise noted:   Have you had symptoms of covid, tested positive or been in contact with someone with symptoms/positive test in the past 5-10 days? negative

## 2020-09-10 ENCOUNTER — Other Ambulatory Visit: Payer: Medicare Other | Admitting: Adult Health Nurse Practitioner

## 2020-09-10 ENCOUNTER — Other Ambulatory Visit: Payer: Self-pay

## 2020-09-10 DIAGNOSIS — M1990 Unspecified osteoarthritis, unspecified site: Secondary | ICD-10-CM

## 2020-09-10 DIAGNOSIS — M5416 Radiculopathy, lumbar region: Secondary | ICD-10-CM

## 2020-09-10 DIAGNOSIS — Z515 Encounter for palliative care: Secondary | ICD-10-CM

## 2020-09-10 DIAGNOSIS — K5900 Constipation, unspecified: Secondary | ICD-10-CM

## 2020-09-10 NOTE — Progress Notes (Signed)
Therapist, nutritional Palliative Care Consult Note Telephone: [phone]  Fax: [phone]    Date of encounter: 09/10/20 PATIENT NAME: Lauren Roman [address] [place] [address]   [phone] (home)  DOB: 1981-11-08 MRN: [id number] PRIMARY CARE PROVIDER:    Danella Penton, MD,  [address] [facility] [address] [facility] [address] [facility] [place] [address] [phone]  REFERRING PROVIDER:   Danella Penton, MD [address] [facility] [address] [facility] [place],  [place] [address] [phone]  RESPONSIBLE PARTY:    Contact Information    Name Relation Home Work Lauren Roman Daughter [phone]  [phone]   Lauren Roman [phone]  [phone]   Lauren Roman  [phone]         Palliative Care was asked to follow this patient by consultation request of  Lauren Penton, MD to address advance care planning and complex medical decision making. This is a follow up visit.  Due to the COVID-19 crisis, this telephone evaluation and treatment contact was done via telephone and it was initiated and consent by this patient and or family.                                   ASSESSMENT AND PLAN / RECOMMENDATIONS:   Advance Care Planning/Goals of Care: Goals include to maximize quality of life and symptom management.   CODE STATUS: DNR  Symptom Management/Plan:  Chronic pain: Believe patient's increased pain is due to her constipation, see below.  Did send in refills for [facility] ER 9 mg p.o. every 12 hours and oxycodone 5 mg p.o. every 6 hours as needed [facility] [place].  Checked [place] substance abuse database with no concerns.  Constipation: Discussed with daughter that she can use magnesium citrate and mineral oil enemas to help give her mother relief.  Also discussed that if she continued to have constipation that she make need to go to the ER to be disimpacted.  Daughter expressed  understanding.  Continue discussion that her mother needs to be on something daily like MiraLAX to prevent these episodes of constipation.  Will call on Monday to see how she is doing.   Follow up Palliative Care Visit: Palliative care will continue to follow for complex medical decision making, advance care planning, and clarification of goals. Return 3 weeks or prn.  I spent 30 minutes providing this consultation. More than 50% of the time in this consultation was spent in counseling and care coordination.  PPS: 30%  HOSPICE ELIGIBILITY/DIAGNOSIS: TBD  Chief Complaint: follow up palliative visit  HISTORY OF PRESENT ILLNESS:  Lauren Roman is a 87 y.o. year old female  with Parkinson's disease, MG, HLD, mild dementia, recurrent falls, recurrent UTIs.  Daughter states that her mother needs refills on her Xtampza ER and oxycodone 5 mg as needed.  States that she has been having increased pain past couple of days.  Also states that her mother is having bad constipation again and has been giving her Senokot 2 tabs twice daily for the past 3 days and giving her milk of magnesia and MiraLAX.  States that her mother's last bowel movement was 5 days ago.  History obtained from review of EMR, discussion with primary team, and interview with family, facility staff/caregiver and/or Lauren Roman.    Physical Exam: Deferred   Thank you for the opportunity to participate in  the care of Lauren Roman.  The palliative care team will continue to follow. Please call our office at [phone] if we can be of additional assistance.   Lauren Roman Lauren Clipper, NP , DNP  This chart was dictated using voice recognition software. Despite best efforts to proofread, errors can occur which can change the documentation meaning.   COVID-19 PATIENT SCREENING TOOL Asked and negative response unless otherwise noted:   Have you had symptoms of covid, tested positive or been in contact with someone with symptoms/positive test in  the past 5-10 days? negative

## 2020-09-13 ENCOUNTER — Telehealth: Payer: Self-pay | Admitting: Adult Health Nurse Practitioner

## 2020-09-13 NOTE — Telephone Encounter (Signed)
Spoke with daughter to update on conversation on Friday about her constipation.  She has had a BM and discussed daily bowel regimen. Encouraged to call with any questions or concerns Tommaso Cavitt K. Garner Nash NP

## 2020-09-28 ENCOUNTER — Encounter: Payer: Self-pay | Admitting: Adult Health Nurse Practitioner

## 2020-09-28 ENCOUNTER — Other Ambulatory Visit: Payer: Self-pay

## 2020-09-28 ENCOUNTER — Other Ambulatory Visit: Payer: Medicare Other | Admitting: Adult Health Nurse Practitioner

## 2020-09-28 VITALS — HR 70

## 2020-09-28 DIAGNOSIS — M1990 Unspecified osteoarthritis, unspecified site: Secondary | ICD-10-CM

## 2020-09-28 DIAGNOSIS — F0390 Unspecified dementia without behavioral disturbance: Secondary | ICD-10-CM

## 2020-09-28 DIAGNOSIS — F03A0 Unspecified dementia, mild, without behavioral disturbance, psychotic disturbance, mood disturbance, and anxiety: Secondary | ICD-10-CM

## 2020-09-28 DIAGNOSIS — R519 Headache, unspecified: Secondary | ICD-10-CM

## 2020-09-28 DIAGNOSIS — K5900 Constipation, unspecified: Secondary | ICD-10-CM

## 2020-09-28 DIAGNOSIS — Z515 Encounter for palliative care: Secondary | ICD-10-CM

## 2020-09-28 NOTE — Progress Notes (Signed)
Designer, jewellery Palliative Care Consult Note Telephone: [phone]  Fax: [phone]    Date of encounter: 09/28/20 PATIENT NAME: Lauren Roman [address] [place] [address]   [phone] (home)  DOB: 1928-08-28 MRN: [id number] PRIMARY CARE PROVIDER:    Rusty Aus, MD,  [place] [address] [phone]  REFERRING PROVIDER:   Rusty Aus, MD [facility] Lauren Roman,  Lauren Roman [address] [phone]  RESPONSIBLE PARTY:    Contact Information    Name Relation Home Work Lauren Roman Daughter [phone]  [phone]   Lauren Roman [phone]  [phone]   Lauren Roman  [phone]         I met face to face with patient and family in home. Palliative Care was asked to follow this patient by consultation request of  Lauren Aus, MD to address advance care planning and complex medical decision making. This is a follow up visit.  Daughter present during visit today                                   ASSESSMENT AND PLAN / RECOMMENDATIONS:   Advance Care Planning/Goals of Care: Goals include to maximize quality of life and symptom management.   CODE STATUS: DNR  Symptom Management/Plan:  Chronic pain:  She is having to take the break through oxycodone more often but is getting relief.  Have encouraged chair exercises.  Daughter states that PT was ordered through [facility] and has not heard anything.  Will reach out to RN navigator to check on this. Did have conversation that her mother may or may not due well with PT if she is not going to work on the exercises outside of when PT is there.   Dementia:  Slow decline but patient seems stable at this time  Constipation:  Patient having better relief with daily Miralax  Headaches:  Believe this may be due to allergies due to her specific symptoms.  Have discussed using a  cold/sinus medication that could help dry up her congestion that could be leading to her sinus headaches. Will call in a week to see how she is doing  Support:  Daughter and her husband are having caregiver strain and due to their medical issues are feeling a bit overwhelmed with patient's care.  They have been resistant to bringing in outside help before but are now starting to look into getting someone to come in to help cook and clean.  Did strongly encourage her to get help in the home and daughter did state that she is having discussions with someone for cleaning and is making a list of things she needs done.  She does have list of caregiver agencies and encouraged to look into these as well.     Follow up Palliative Care Visit: Palliative care will continue to follow for complex medical decision making, advance care planning, and clarification of goals. Return 6 weeks or prn.  Encouraged to call with any questions or concerns  I spent 60 minutes providing this consultation. More than 50% of the time in this consultation was spent in counseling and care coordination.   PPS: 30%  HOSPICE ELIGIBILITY/DIAGNOSIS: TBD  Chief Complaint: Follow-up palliative visit  HISTORY OF PRESENT ILLNESS:  Lauren Roman is a 87 y.o. year old female  with Parkinson's disease, MG,  HLD, mild dementia, recurrent falls, recurrent UTIs. Daughter does report that she is having to give her mother more of the breakthrough oxycodone.  She is giving her 2-3 doses of the oxycodone 5 mg per day.  She is trying to encourage her mother to do chair exercises without much success.  Her appetite has been good.  She has been complaining of headaches which she states has been behind her eyes with occasional ear pain complaints for past 2-3 weeks.  She does take flonase daily.  She is sleeping well throughout the night.  Daughter states that they continue to use Endit cream, Allevyn patches, and repositioning to help with redness on  bottom.  Rest of 10 point ROS asked and negative except what is stated in HPI  History obtained from review of EMR and interview with family and [name].    PHYSICAL EXAM:  General: NAD, frail appearing, thin Eyes: sclera anicteric and noninjected with no discharge noted ENMT: moist mucous membranes Cardiovascular: regular rate and rhythm Pulmonary:lung sounds clear; normal respiratory effort Abdomen: soft, nontender, + bowel sounds Extremities: no edema, no joint deformities Skin: no rasheson exposed skin Neurological: Weakness; alert and oriented to person and place  Thank you for the opportunity to participate in the care of [name].  The palliative care team will continue to follow. Please call our office at [phone] if we can be of additional assistance.   Lauren Roman Lauren Downer, NP , DNP  This chart was dictated using voice recognition software. Despite best efforts to proofread, errors can occur which can change the documentation meaning.   COVID-19 PATIENT SCREENING TOOL Asked and negative response unless otherwise noted:   Have you had symptoms of covid, tested positive or been in contact with someone with symptoms/positive test in the past 5-10 days?  Negative

## 2020-10-14 ENCOUNTER — Telehealth: Payer: Self-pay | Admitting: Adult Health Nurse Practitioner

## 2020-10-14 NOTE — Telephone Encounter (Signed)
Returned daughter's message that her mother needed refill on her Xtampza 9 mg every 12 hours.  Checked [place] substance abuse website with no concerns.  Sent escript to [facility]. Shaquanda Graves K. Garner Nash NP

## 2020-10-22 ENCOUNTER — Telehealth: Payer: Self-pay | Admitting: Adult Health Nurse Practitioner

## 2020-10-22 NOTE — Telephone Encounter (Signed)
Returned daughter's VM.  When picked up last prescription of Xtampza there were only 14 capsules and order was for a 30 day supply.  Daughter states that she is going to call insurance company and make sure there is no issues of concern and will reach out with [facility] to see if they can get the prescription cheaper.  She is to call me back. Ansh Fauble K. Garner Nash NP

## 2020-10-22 NOTE — Telephone Encounter (Signed)
Daughter called back and was able to get the prescription and cost straightened out at the pharmacy Dominick Zertuche K. Garner Nash NP

## 2020-11-01 ENCOUNTER — Telehealth: Payer: Self-pay | Admitting: Adult Health Nurse Practitioner

## 2020-11-01 NOTE — Telephone Encounter (Signed)
Returned daughter's VM.  She needed to reschedule her mother's appointment for 11/03/20.  Rescheduled for 11/11/20 @ 3:30 Homer Pfeifer K. Garner Nash NP

## 2020-11-03 ENCOUNTER — Other Ambulatory Visit: Payer: Medicare Other | Admitting: Adult Health Nurse Practitioner

## 2020-11-03 ENCOUNTER — Other Ambulatory Visit: Payer: Self-pay

## 2020-11-11 ENCOUNTER — Other Ambulatory Visit: Payer: Medicare Other | Admitting: Adult Health Nurse Practitioner

## 2020-11-11 ENCOUNTER — Other Ambulatory Visit: Payer: Self-pay

## 2020-11-11 ENCOUNTER — Encounter: Payer: Self-pay | Admitting: Adult Health Nurse Practitioner

## 2020-11-11 DIAGNOSIS — F03A0 Unspecified dementia, mild, without behavioral disturbance, psychotic disturbance, mood disturbance, and anxiety: Secondary | ICD-10-CM

## 2020-11-11 DIAGNOSIS — Z515 Encounter for palliative care: Secondary | ICD-10-CM

## 2020-11-11 DIAGNOSIS — M1990 Unspecified osteoarthritis, unspecified site: Secondary | ICD-10-CM

## 2020-11-11 DIAGNOSIS — M5416 Radiculopathy, lumbar region: Secondary | ICD-10-CM

## 2020-11-11 DIAGNOSIS — F0390 Unspecified dementia without behavioral disturbance: Secondary | ICD-10-CM

## 2020-11-11 NOTE — Progress Notes (Signed)
[facility] Consult Note Telephone: [phone]  Fax: [phone]    Date of encounter: 11/11/20 PATIENT NAME: Lauren Roman [address] [place] [address]   [phone] (home)  DOB: 1929/05/11 MRN: [id number] PRIMARY CARE PROVIDER:    Rusty Aus, MD,  [place] [address] [phone]  REFERRING PROVIDER:   Rusty Aus, MD [facility] [place],  [place] [address] [phone]  RESPONSIBLE PARTY:    Contact Information     Name Relation Home Work Mora Daughter [phone]  [phone]   Karrie Meres [phone]  [phone]   Puckett,Sherry  [phone]          I met face to face with patient and family in home. Palliative Care was asked to follow this patient by consultation request of  Rusty Aus, MD to address advance care planning and complex medical decision making. This is a follow up visit.  Daughter present during visit today                                   ASSESSMENT AND PLAN / RECOMMENDATIONS:   Advance Care Planning/Goals of Care: Goals include to maximize quality of life and symptom management.  CODE STATUS: DNR  Symptom Management/Plan:  Pain: related to OA and lumbar radiculopathy: getting relief with current Xtampza 9 mg BID and oxycodone 5 mg Q6hr PRN.  Sent in refills for both of these to [place] with 30 day supply of each.  Checked [place] substance abuse data base with no concerns  Dementia:  Patient continues to offer less help with transfers.  Daughter stated neurology recommended PT.  She has not heard anything from home health agency about starting PT.  Have encouraged her to reach back out to neurology for status on this order.    Follow up Palliative Care Visit: Palliative care will continue to follow for complex medical decision making, advance  care planning, and clarification of goals. Return 4 weeks or prn. Encouraged to call with any questions or concerns  I spent 60 minutes providing this consultation. More than 50% of the time in this consultation was spent in counseling and care coordination.  PPS: 30%  HOSPICE ELIGIBILITY/DIAGNOSIS: TBD  Chief Complaint: follow up palliative visit  HISTORY OF PRESENT ILLNESS:  Lauren Roman is a 87 y.o. year old female  with Parkinson's disease, MG, HLD, mild dementia, recurrent falls, recurrent UTIs. Daughter does state that her mother still gets relief with the Xtampza 9 mg BID and oxycodone 5 mg Q 6hrs PRN though is having to take more of the breakthrough oxy more often some days.  Her appetite comes and goes but does not think she has lost weight.  Does supplement with Ensure.  Daughter states that she gets red spots on her bottom and does keep her clean and dry, repositioned, and applies foam pads for protection.  Has order for air mattress from PCP and she is awaiting call from DME company.  She does have concerns about her heels as her mother will complain that they hurt but no redness or bogginess noted today.  Daughter keeps her mother's skin well moisturized.  Patient does get recurrent UTIs.  Today denies dysuria, hematuria, foul odor, fever, increased confusion.  History obtained from review of EMR and interview  with family and Ms. Hou.    PHYSICAL EXAM:    General: NAD, frail appearing, thin Eyes: sclera anicteric and noninjected with no discharge noted ENMT: moist mucous membranes Cardiovascular: no edema Pulmonary:  normal respiratory effort Extremities: no joint deformities Skin: no rashes on exposed skin Neurological: alert and oriented to person and place   Thank you for the opportunity to participate in the care of Ms. Leanos.  The palliative care team will continue to follow. Please call our office at [phone] if we can be of additional assistance.   Mylisa Brunson Jenetta Downer, NP , DNP  This chart was dictated using voice recognition software.  Despite best efforts to proofread,  errors can occur which can change the documentation meaning.   COVID-19 PATIENT SCREENING TOOL Asked and negative response unless otherwise noted:   Have you had symptoms of covid, tested positive or been in contact with someone with symptoms/positive test in the past 5-10 days?

## 2020-12-03 ENCOUNTER — Other Ambulatory Visit: Payer: Self-pay

## 2020-12-03 ENCOUNTER — Other Ambulatory Visit: Payer: Medicare Other | Admitting: Student

## 2020-12-03 DIAGNOSIS — R52 Pain, unspecified: Secondary | ICD-10-CM

## 2020-12-03 DIAGNOSIS — K5900 Constipation, unspecified: Secondary | ICD-10-CM

## 2020-12-03 DIAGNOSIS — Z515 Encounter for palliative care: Secondary | ICD-10-CM

## 2020-12-03 DIAGNOSIS — R531 Weakness: Secondary | ICD-10-CM
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
## 2020-12-06 ENCOUNTER — Telehealth: Payer: Self-pay | Admitting: Student

## 2020-12-06 NOTE — Telephone Encounter (Signed)
Palliative NP made daughter aware that we can proceed with hospice evaluation. She is okay with changing to alternative from [facility], as it is non-formulary.  Spoke with Dr. Rondel Baton office to see if he will serve as hospice attending; awaiting return call.

## 2021-02-14 DIAGNOSIS — Z23 Encounter for immunization: Secondary | ICD-10-CM | POA: Diagnosis not present

## 2021-04-14 DEATH — deceased
